# Patient Record
Sex: Female | Born: 1935 | Race: Black or African American | Hispanic: No | Smoking: Never smoker
Health system: Southern US, Community
[De-identification: ages and names within clinical notes are randomized; demographics above are authoritative.]

## PROBLEM LIST (undated history)

## (undated) DIAGNOSIS — I10 Essential (primary) hypertension: Secondary | ICD-10-CM

## (undated) DIAGNOSIS — E785 Hyperlipidemia, unspecified: Secondary | ICD-10-CM

## (undated) DIAGNOSIS — M109 Gout, unspecified: Secondary | ICD-10-CM

## (undated) DIAGNOSIS — M79672 Pain in left foot: Secondary | ICD-10-CM

## (undated) HISTORY — PX: EYE SURGERY: SHX253

## (undated) HISTORY — DX: Hyperlipidemia, unspecified: E78.5

## (undated) HISTORY — DX: Essential (primary) hypertension: I10

## (undated) HISTORY — PX: BACK SURGERY: SHX140

---
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
## 1978-04-05 HISTORY — PX: LAPAROSCOPIC CHOLECYSTECTOMY: SUR755

## 2003-03-04 ENCOUNTER — Encounter: Admission: RE | Admit: 2003-03-04 | Discharge: 2003-03-04 | Payer: Self-pay | Admitting: Nephrology

## 2003-04-06 HISTORY — PX: ESOPHAGOGASTRODUODENOSCOPY: SHX1529

## 2006-07-27 ENCOUNTER — Other Ambulatory Visit: Admission: RE | Admit: 2006-07-27 | Discharge: 2006-07-27 | Payer: Self-pay | Admitting: Family Medicine

## 2011-07-01 ENCOUNTER — Encounter: Payer: Self-pay | Admitting: Gastroenterology

## 2011-08-04 HISTORY — PX: COLONOSCOPY: SHX174

## 2011-08-06 ENCOUNTER — Telehealth: Payer: Self-pay | Admitting: *Deleted

## 2011-08-06 NOTE — Telephone Encounter (Signed)
Pt did not show for previsit appointment 08/06/11 at 1:30 pm. Spoke with patient who asked me to call her daughter on cell phone and reschedule with her. Phone to daughter, no answer, left message to call back by 5:00 pm today to reschedule previsit or colonoscopy scheduled 08/20/11 will be cancelled as well and both appointments will need to be rescheduled.

## 2011-08-11 ENCOUNTER — Ambulatory Visit (AMBULATORY_SURGERY_CENTER): Payer: Medicare Other | Admitting: *Deleted

## 2011-08-11 ENCOUNTER — Encounter: Payer: Self-pay | Admitting: Gastroenterology

## 2011-08-11 VITALS — Ht 63.0 in | Wt 167.5 lb

## 2011-08-11 DIAGNOSIS — Z1211 Encounter for screening for malignant neoplasm of colon: Secondary | ICD-10-CM

## 2011-08-11 MED ORDER — PEG-KCL-NACL-NASULF-NA ASC-C 100 G PO SOLR: ORAL | Status: DC

## 2011-08-20 ENCOUNTER — Ambulatory Visit (AMBULATORY_SURGERY_CENTER): Payer: Medicare Other | Admitting: Gastroenterology

## 2011-08-20 ENCOUNTER — Encounter: Payer: Self-pay | Admitting: Gastroenterology

## 2011-08-20 VITALS — BP 151/82 | HR 59 | Temp 96.1°F | Resp 20 | Ht 63.0 in | Wt 167.0 lb

## 2011-08-20 DIAGNOSIS — Z86010 Personal history of colonic polyps: Secondary | ICD-10-CM

## 2011-08-20 DIAGNOSIS — D126 Benign neoplasm of colon, unspecified: Secondary | ICD-10-CM

## 2011-08-20 DIAGNOSIS — Z1211 Encounter for screening for malignant neoplasm of colon: Secondary | ICD-10-CM

## 2011-08-20 MED ORDER — SODIUM CHLORIDE 0.9 % IV SOLN: 500.0000 mL | INTRAVENOUS | Status: DC

## 2011-08-20 NOTE — Progress Notes (Signed)
Patient did not experience any of the following events: a burn prior to discharge; a fall within the facility; wrong site/side/patient/procedure/implant event; or a hospital transfer or hospital admission upon discharge from the facility. (G8907) Patient did not have preoperative order for IV antibiotic SSI prophylaxis. (G8918)  

## 2011-08-20 NOTE — Op Note (Signed)
[facility] [address] [place], [place]  [address]  COLONOSCOPY PROCEDURE REPORT PATIENT:  Jackie Hickman, Jackie Hickman  MR#:  [id number] BIRTHDATE:  Nov 13, 1932, 79 yrs. old  GENDER:  female ENDOSCOPIST:  Judie Petit T. Russella Dar, MD, [facility]  PROCEDURE DATE:  08/20/2011 PROCEDURE:  Colonoscopy with snare polypectomy ASA CLASS:  Class II INDICATIONS:  1) surveillance and high-risk screening  2) history of pre-cancerous (adenomatous) colon polyps: 2005 MEDICATIONS:   These medications were titrated to patient response per physician's verbal order, Fentanyl 50 mcg IV, Versed 4 mg IV DESCRIPTION OF PROCEDURE:   After the risks benefits and alternatives of the procedure were thoroughly explained, informed consent was obtained.  Digital rectal exam was performed and revealed no abnormalities.   The LB [id number] endoscope was introduced through the anus and advanced to the cecum, which was identified by both the appendix and ileocecal valve, without limitations.  The quality of the prep was good, using MoviPrep. The instrument was then slowly withdrawn as the colon was fully examined. <<PROCEDUREIMAGES>> FINDINGS:  A sessile polyp was found in the cecum. It was 6 mm in size. Polyp was snared without cautery. Retrieval was successful. A sessile polyp was found in the ascending colon located 5 cm above the IC valve-see photo. It was 25 mm in size. Polyp was snared, then cauterized with monopolar cautery. Retrieval was successful. Piecemeal polypectomy with almost all neoplastic tissue removed. Otherwise normal colonoscopy without other polyps, masses, vascular ectasias, or inflammatory changes. Retroflexed views in the rectum revealed no abnormalities. The time to cecum = 2.33  minutes. The scope was then withdrawn (time =  20.5  min) from the patient and the procedure completed.  COMPLICATIONS:  None  ENDOSCOPIC IMPRESSION: 1) 6 mm sessile polyp in the cecum 2) 25 mm sessile polyp in  the ascending colon  RECOMMENDATIONS: 1) Hold aspirin, aspirin products, and anti-inflammatory medication for 3 weeks. 2) Await pathology results 3) Repeat Colonoscopy in 6 months if asc colon polyp benign.  Venita Lick. Russella Dar, MD, [name]  CC:  Rudi Heap, MD  n. Rosalie DoctorVenita Lick. Friend Dorfman at 08/20/2011 11:33 AM  Pershing Cox, [id number]

## 2011-08-20 NOTE — Patient Instructions (Addendum)

## 2011-08-23 ENCOUNTER — Telehealth: Payer: Self-pay | Admitting: *Deleted

## 2011-08-23 NOTE — Telephone Encounter (Signed)
  Follow up Call-  Call back number 08/20/2011  Post procedure Call Back phone  # [phone]  Permission to leave phone message Yes     Patient questions:  Do you have a fever, pain , or abdominal swelling? no Pain Score  0 *  Have you tolerated food without any problems? yes  Have you been able to return to your normal activities? yes  Do you have any questions about your discharge instructions: Diet   no Medications  no Follow up visit  no  Do you have questions or concerns about your Care? no  Actions: * If pain score is 4 or above: No action needed, pain <4.

## 2011-08-24 ENCOUNTER — Encounter: Payer: Self-pay | Admitting: Gastroenterology

## 2012-07-28 ENCOUNTER — Encounter: Payer: Self-pay | Admitting: Gastroenterology

## 2012-08-03 ENCOUNTER — Telehealth: Payer: Self-pay | Admitting: Gastroenterology

## 2012-08-03 HISTORY — PX: COLONOSCOPY: SHX174

## 2012-08-03 NOTE — Telephone Encounter (Signed)
Patient is due, she was just sent another recall She is scheduled for 08/30/12 and pre-visit for 5/15

## 2012-08-17 ENCOUNTER — Encounter: Payer: Self-pay | Admitting: Gastroenterology

## 2012-08-17 ENCOUNTER — Ambulatory Visit (AMBULATORY_SURGERY_CENTER): Payer: Medicare Other | Admitting: *Deleted

## 2012-08-17 VITALS — Ht 63.0 in | Wt 166.8 lb

## 2012-08-17 DIAGNOSIS — Z1211 Encounter for screening for malignant neoplasm of colon: Secondary | ICD-10-CM

## 2012-08-17 MED ORDER — NA SULFATE-K SULFATE-MG SULF 17.5-3.13-1.6 GM/177ML PO SOLN: ORAL | Status: DC

## 2012-08-25 ENCOUNTER — Other Ambulatory Visit: Payer: Self-pay | Admitting: Family Medicine

## 2012-08-30 ENCOUNTER — Other Ambulatory Visit: Payer: Self-pay | Admitting: Gastroenterology

## 2012-08-30 ENCOUNTER — Encounter: Payer: Self-pay | Admitting: Gastroenterology

## 2012-08-30 ENCOUNTER — Other Ambulatory Visit: Payer: Self-pay | Admitting: *Deleted

## 2012-08-30 ENCOUNTER — Ambulatory Visit (AMBULATORY_SURGERY_CENTER): Payer: Medicare Other | Admitting: Gastroenterology

## 2012-08-30 VITALS — BP 169/88 | HR 52 | Temp 97.0°F | Resp 16 | Ht 63.0 in | Wt 166.0 lb

## 2012-08-30 DIAGNOSIS — R109 Unspecified abdominal pain: Secondary | ICD-10-CM

## 2012-08-30 DIAGNOSIS — Z1211 Encounter for screening for malignant neoplasm of colon: Secondary | ICD-10-CM

## 2012-08-30 DIAGNOSIS — Z860100 Personal history of colon polyps, unspecified: Secondary | ICD-10-CM

## 2012-08-30 DIAGNOSIS — D126 Benign neoplasm of colon, unspecified: Secondary | ICD-10-CM

## 2012-08-30 MED ORDER — SODIUM CHLORIDE 0.9 % IV SOLN: 500.0000 mL | INTRAVENOUS | Status: DC

## 2012-08-30 MED ORDER — HYOSCYAMINE SULFATE 0.125 MG SL SUBL: 0.1250 mg | SUBLINGUAL_TABLET | SUBLINGUAL | Status: DC | PRN

## 2012-08-30 NOTE — Op Note (Signed)
[facility] [address] [place], [address]   COLONOSCOPY PROCEDURE REPORT  PATIENT: Jackie Hickman, Jackie Hickman  MR#: [id number] BIRTHDATE: 1932-09-15 , 80  yrs. old GENDER: Female ENDOSCOPIST: Meryl Dare, MD, [facility] PROCEDURE DATE:  08/30/2012 PROCEDURE:   Colonoscopy with snare polypectomy ASA CLASS:   Class II INDICATIONS:Patient's personal history of adenomatous colon polyps: TVA with HGD piecemeal polypectomy in 08/2011. MEDICATIONS: MAC sedation, administered by CRNA and propofol (Diprivan) 100mg  IV DESCRIPTION OF PROCEDURE:   After the risks benefits and alternatives of the procedure were thoroughly explained, informed consent was obtained.  A digital rectal exam revealed no abnormalities of the rectum.   The LB [id number]  endoscope was introduced through the anus and advanced to the cecum, which was identified by both the appendix and ileocecal valve. No adverse events experienced.   The quality of the prep was good, using MoviPrep  The instrument was then slowly withdrawn as the colon was fully examined.  COLON FINDINGS: A sessile polyp measuring 6 mm in size was found at the cecum.  A polypectomy was performed with a cold snare.  The resection was complete and the polyp tissue was completely retrieved.   A sessile polyp measuring 12 mm in size was found in the ascending colon.  A piecemeal polypectomy was performed using snare cautery.  The resection was appeared to be complete and the polyp tissue was completely retrieved.   A sessile polyp measuring 6 mm in size was found in the transverse colon.  A polypectomy was performed with a cold snare.  The resection was complete and the polyp tissue was completely retrieved.   The colon was otherwise normal.  There was no diverticulosis, inflammation, polyps or cancers unless previously stated.  Retroflexed views revealed no abnormalities. The time to cecum=1 minutes 30 seconds.  Withdrawal time=11  minutes 00 seconds.  The scope was withdrawn and the procedure completed. COMPLICATIONS: There were no complications.  ENDOSCOPIC IMPRESSION: 1.   Sessile polyp measuring 6 mm at the cecum; polypectomy performed with a cold snare 2.   Sessile polyp measuring 12 mmin the ascending colon; piecemeal polypectomy performed using snare cautery 3.   Sessile polyp measuring 6 mm in the transverse colon; polypectomy performed with a cold snare  RECOMMENDATIONS: 1.  Hold aspirin, aspirin products, and anti-inflammatory medication for 2 weeks. 2.  Await pathology results 3.  Repeat Colonoscopy in 3-6 months, pending path review, to assess completeness of ascending colon polypectomy.   eSigned:  Meryl Dare, MD, [facility] 08/30/2012 2:25 PM      PATIENT NAME:  Jackie Hickman, Jackie Hickman MR#: [id number]

## 2012-08-30 NOTE — Progress Notes (Signed)
Patient did not experience any of the following events: a burn prior to discharge; a fall within the facility; wrong site/side/patient/procedure/implant event; or a hospital transfer or hospital admission upon discharge from the facility. (G8907) Patient did not have preoperative order for IV antibiotic SSI prophylaxis. (G8918)  

## 2012-08-30 NOTE — Progress Notes (Signed)
Lidocaine-40mg IV prior to Propofol InductionPropofol given over incremental dosages 

## 2012-08-30 NOTE — Patient Instructions (Addendum)
HOLD ASPIRIN,ASPIRIN PRODUCTS AND ANTIINFLAMMATORIES FOR 2 WEEKS , June 11,2014     YOU HAD AN ENDOSCOPIC PROCEDURE TODAY AT THE [facility]: Refer to the procedure report that was given to you for any specific questions about what was found during the examination.  If the procedure report does not answer your questions, please call your gastroenterologist to clarify.  If you requested that your care partner not be given the details of your procedure findings, then the procedure report has been included in a sealed envelope for you to review at your convenience later.  YOU SHOULD EXPECT: Some feelings of bloating in the abdomen. Passage of more gas than usual.  Walking can help get rid of the air that was put into your GI tract during the procedure and reduce the bloating. If you had a lower endoscopy (such as a colonoscopy or flexible sigmoidoscopy) you may notice spotting of blood in your stool or on the toilet paper. If you underwent a bowel prep for your procedure, then you may not have a normal bowel movement for a few days.  DIET: Your first meal following the procedure should be a light meal and then it is ok to progress to your normal diet.  A half-sandwich or bowl of soup is an example of a good first meal.  Heavy or fried foods are harder to digest and may make you feel nauseous or bloated.  Likewise meals heavy in dairy and vegetables can cause extra gas to form and this can also increase the bloating.  Drink plenty of fluids but you should avoid alcoholic beverages for 24 hours.  ACTIVITY: Your care partner should take you home directly after the procedure.  You should plan to take it easy, moving slowly for the rest of the day.  You can resume normal activity the day after the procedure however you should NOT DRIVE or use heavy machinery for 24 hours (because of the sedation medicines used during the test).    SYMPTOMS TO REPORT IMMEDIATELY: A gastroenterologist can be reached  at any hour.  During normal business hours, 8:30 AM to 5:00 PM Monday through Friday, call [phone].  After hours and on weekends, please call the GI answering service at [phone] who will take a message and have the physician on call contact you.   Following lower endoscopy (colonoscopy or flexible sigmoidoscopy):  Excessive amounts of blood in the stool  Significant tenderness or worsening of abdominal pains  Swelling of the abdomen that is new, acute  Fever of 100F or higher  FOLLOW UP: If any biopsies were taken you will be contacted by phone or by letter within the next 1-3 weeks.  Call your gastroenterologist if you have not heard about the biopsies in 3 weeks.  Our staff will call the home number listed on your records the next business day following your procedure to check on you and address any questions or concerns that you may have at that time regarding the information given to you following your procedure. This is a courtesy call and so if there is no answer at the home number and we have not heard from you through the emergency physician on call, we will assume that you have returned to your regular daily activities without incident.  SIGNATURES/CONFIDENTIALITY: You and/or your care partner have signed paperwork which will be entered into your electronic medical record.  These signatures attest to the fact that that the information above on your After Visit  Summary has been reviewed and is understood.  Full responsibility of the confidentiality of this discharge information lies with you and/or your care-partner. 

## 2012-08-30 NOTE — Progress Notes (Signed)
Called to room to assist during endoscopic procedure.  Patient ID and intended procedure confirmed with present staff. Received instructions for my participation in the procedure from the performing physician.  

## 2012-08-31 ENCOUNTER — Telehealth: Payer: Self-pay | Admitting: *Deleted

## 2012-08-31 NOTE — Telephone Encounter (Signed)
  Follow up Call-  Call back number 08/30/2012 08/20/2011  Post procedure Call Back phone  # [phone]  Permission to leave phone message Yes Yes     Patient questions:  Do you have a fever, pain , or abdominal swelling? no Pain Score  0 *  Have you tolerated food without any problems? yes  Have you been able to return to your normal activities? yes  Do you have any questions about your discharge instructions: Diet   no Medications  no Follow up visit  no  Do you have questions or concerns about your Care? no  Actions: * If pain score is 4 or above: No action needed, pain <4.

## 2012-09-04 ENCOUNTER — Encounter: Payer: Self-pay | Admitting: Gastroenterology

## 2012-10-02 ENCOUNTER — Other Ambulatory Visit: Payer: Self-pay | Admitting: Family Medicine

## 2012-10-03 NOTE — Telephone Encounter (Signed)
Last seen 05/04/12  [facility] [place]

## 2012-10-24 ENCOUNTER — Other Ambulatory Visit: Payer: Self-pay | Admitting: Nurse Practitioner

## 2012-10-26 ENCOUNTER — Other Ambulatory Visit: Payer: Self-pay

## 2012-10-26 MED ORDER — ATORVASTATIN CALCIUM 40 MG PO TABS: 40.0000 mg | ORAL_TABLET | Freq: Every day | ORAL | Status: DC

## 2012-10-26 MED ORDER — ATORVASTATIN CALCIUM 20 MG PO TABS: 20.0000 mg | ORAL_TABLET | Freq: Every day | ORAL | Status: DC

## 2012-10-26 NOTE — Telephone Encounter (Signed)
TAMMY FROM [facility] CALLED TO SAY SHE GOT REFILL SENT IN BY DB ON ATORVASTATIN 20MG  BUT SHE HAS BEEN GETTING 40 MG PLEASE CLARIFY AND CALL [facility].

## 2012-10-26 NOTE — Telephone Encounter (Signed)
Last seen 05/04/12  [facility]  LAST LIPIDS 04/04/12

## 2012-10-30 ENCOUNTER — Telehealth: Payer: Self-pay | Admitting: Family Medicine

## 2012-11-02 NOTE — Telephone Encounter (Signed)
Per pt she doesn't want

## 2012-12-15 ENCOUNTER — Other Ambulatory Visit: Payer: Self-pay | Admitting: Family Medicine

## 2012-12-18 NOTE — Telephone Encounter (Signed)
Last lipids 04/04/12  [facility]

## 2012-12-18 NOTE — Telephone Encounter (Signed)
ntbs for lipitor refill 

## 2012-12-25 ENCOUNTER — Encounter: Payer: Self-pay | Admitting: Gastroenterology

## 2013-01-18 ENCOUNTER — Ambulatory Visit: Payer: Medicare Other | Admitting: Gastroenterology

## 2013-01-18 ENCOUNTER — Ambulatory Visit (AMBULATORY_SURGERY_CENTER): Payer: Self-pay

## 2013-01-18 VITALS — Ht 63.0 in | Wt 159.0 lb

## 2013-01-18 DIAGNOSIS — Z860100 Personal history of colon polyps, unspecified: Secondary | ICD-10-CM

## 2013-01-18 MED ORDER — SUPREP BOWEL PREP KIT 17.5-3.13-1.6 GM/177ML PO SOLN: 1.0000 | Freq: Once | ORAL | Status: DC

## 2013-01-19 ENCOUNTER — Encounter: Payer: Self-pay | Admitting: Gastroenterology

## 2013-01-22 ENCOUNTER — Encounter: Payer: Self-pay | Admitting: General Practice

## 2013-01-22 ENCOUNTER — Other Ambulatory Visit: Payer: Self-pay

## 2013-01-22 ENCOUNTER — Ambulatory Visit (INDEPENDENT_AMBULATORY_CARE_PROVIDER_SITE_OTHER): Payer: Medicare Other | Admitting: General Practice

## 2013-01-22 VITALS — BP 210/100 | HR 71 | Temp 98.1°F | Ht 63.0 in | Wt 159.0 lb

## 2013-01-22 DIAGNOSIS — I10 Essential (primary) hypertension: Secondary | ICD-10-CM

## 2013-01-22 DIAGNOSIS — Z09 Encounter for follow-up examination after completed treatment for conditions other than malignant neoplasm: Secondary | ICD-10-CM

## 2013-01-22 DIAGNOSIS — E785 Hyperlipidemia, unspecified: Secondary | ICD-10-CM

## 2013-01-22 DIAGNOSIS — E119 Type 2 diabetes mellitus without complications: Secondary | ICD-10-CM

## 2013-01-22 LAB — POCT CBC
Granulocyte percent: 54.5 %G (ref 37–80)
Lymph, poc: 2.6 (ref 0.6–3.4)
MCH, POC: 31.5 pg — AB (ref 27–31.2)
MCV: 96.7 fL (ref 80–97)
POC Granulocyte: 3.4 (ref 2–6.9)
Platelet Count, POC: 306 10*3/uL (ref 142–424)
RDW, POC: 13.7 %
WBC: 6.3 10*3/uL (ref 4.6–10.2)

## 2013-01-22 MED ORDER — NEBIVOLOL HCL 10 MG PO TABS: 10.0000 mg | ORAL_TABLET | Freq: Every day | ORAL | Status: DC

## 2013-01-22 MED ORDER — ATORVASTATIN CALCIUM 40 MG PO TABS: 40.0000 mg | ORAL_TABLET | Freq: Every day | ORAL | Status: DC

## 2013-01-22 MED ORDER — METFORMIN HCL 1000 MG PO TABS: 1000.0000 mg | ORAL_TABLET | Freq: Two times a day (BID) | ORAL | Status: DC

## 2013-01-22 MED ORDER — AMLODIPINE BESYLATE 2.5 MG PO TABS: 2.5000 mg | ORAL_TABLET | Freq: Every day | ORAL | Status: DC

## 2013-01-22 NOTE — Progress Notes (Signed)
  Subjective:    Patient ID: Jackie Hickman, female    DOB: Apr 01, 1933, 79 y.o.   MRN: [id number]  HPI Patient presents today for chronic health follow up. She has a history of hypertension, diabetes, and hyperlipidemia. She reports taking medications as directed. Reports checking blood sugars daily and range 120's-160's. She denies checking blood pressure. Reports eating low salt diet. Denies regular exercise.     Review of Systems  Constitutional: Negative for fever and chills.  Respiratory: Negative for chest tightness and shortness of breath.   Cardiovascular: Negative for chest pain, palpitations and leg swelling.  Gastrointestinal: Negative for nausea, vomiting, abdominal pain, diarrhea, constipation and blood in stool.  Genitourinary: Negative for dysuria, hematuria and difficulty urinating.  Musculoskeletal: Negative for back pain and neck pain.  Neurological: Negative for dizziness, weakness and headaches.       Objective:   Physical Exam  Constitutional: She is oriented to person, place, and time. She appears well-developed and well-nourished.  HENT:  Head: Normocephalic and atraumatic.  Right Ear: External ear normal.  Left Ear: External ear normal.  Nose: Nose normal.  Mouth/Throat: Oropharynx is clear and moist.  Eyes: EOM are normal. Pupils are equal, round, and reactive to light.  Neck: Normal range of motion. Neck supple. No thyromegaly present.  Cardiovascular: Normal rate, regular rhythm and normal heart sounds.   Pulmonary/Chest: Effort normal and breath sounds normal. No respiratory distress. She exhibits no tenderness.  Abdominal: Soft. Bowel sounds are normal. She exhibits no distension. There is no tenderness.  Musculoskeletal: She exhibits no edema and no tenderness.  Lymphadenopathy:    She has no cervical adenopathy.  Neurological: She is alert and oriented to person, place, and time.  Skin: Skin is warm and dry.  Psychiatric: She has a normal mood and  affect.          Assessment & Plan:  1. Hypertension  - amLODipine (NORVASC) 2.5 MG tablet; Take 1 tablet (2.5 mg total) by mouth daily.  Dispense: 30 tablet; Refill: 3 - nebivolol (BYSTOLIC) 10 MG tablet; Take 1 tablet (10 mg total) by mouth daily.  Dispense: 30 tablet; Refill: 3 - CMP14+EGFR  2. Hyperlipidemia  - atorvastatin (LIPITOR) 40 MG tablet; Take 1 tablet (40 mg total) by mouth daily.  Dispense: 30 tablet; Refill: 3 - NMR, lipoprofile  3. Diabetes  - metFORMIN (GLUCOPHAGE) 1000 MG tablet; Take 1 tablet (1,000 mg total) by mouth 2 (two) times daily with a meal.  Dispense: 60 tablet; Refill: 3 - POCT glycosylated hemoglobin (Hb A1C)  4. Follow-up exam, 3-6 months since previous exam  - POCT CBC -Continue all current medications Labs pending, cmp, lipid panel F/u in 3 months Discussed exercise and diet  Coralie Keens, FNP-C

## 2013-01-22 NOTE — Patient Instructions (Signed)

## 2013-01-24 LAB — CMP14+EGFR
ALT: 8 IU/L (ref 0–32)
AST: 14 IU/L (ref 0–40)
Albumin/Globulin Ratio: 1.6 (ref 1.1–2.5)
Alkaline Phosphatase: 49 IU/L (ref 39–117)
BUN/Creatinine Ratio: 19 (ref 11–26)
CO2: 23 mmol/L (ref 18–29)
Chloride: 102 mmol/L (ref 97–108)
GFR calc Af Amer: 86 mL/min/{1.73_m2} (ref >59)
Glucose: 148 mg/dL — ABNORMAL HIGH (ref 65–99)
Potassium: 4.5 mmol/L (ref 3.5–5.2)
Sodium: 145 mmol/L — ABNORMAL HIGH (ref 134–144)
Total Bilirubin: 0.3 mg/dL (ref 0.0–1.2)
Total Protein: 7 g/dL (ref 6.0–8.5)

## 2013-01-24 LAB — NMR, LIPOPROFILE
HDL Cholesterol by NMR: 52 mg/dL (ref >=40)
LDL Particle Number: 2527 nmol/L — ABNORMAL HIGH (ref <1000)
LDL Size: 20.5 nm — ABNORMAL LOW (ref >20.5)
LP-IR Score: 57 — ABNORMAL HIGH (ref <=45)
Small LDL Particle Number: 1394 nmol/L — ABNORMAL HIGH (ref <=527)

## 2013-01-29 ENCOUNTER — Ambulatory Visit: Payer: Medicare Other | Admitting: General Practice

## 2013-01-29 ENCOUNTER — Encounter: Payer: Self-pay | Admitting: General Practice

## 2013-01-29 ENCOUNTER — Ambulatory Visit (INDEPENDENT_AMBULATORY_CARE_PROVIDER_SITE_OTHER): Payer: Medicare Other | Admitting: General Practice

## 2013-01-29 VITALS — BP 192/96 | HR 59 | Temp 97.2°F | Ht 63.0 in | Wt 158.0 lb

## 2013-01-29 DIAGNOSIS — I10 Essential (primary) hypertension: Secondary | ICD-10-CM

## 2013-01-29 MED ORDER — HYDROCHLOROTHIAZIDE 25 MG PO TABS: 25.0000 mg | ORAL_TABLET | Freq: Every day | ORAL | Status: DC

## 2013-01-29 MED ORDER — LISINOPRIL 40 MG PO TABS: 40.0000 mg | ORAL_TABLET | Freq: Every day | ORAL | Status: DC

## 2013-01-29 MED ORDER — CLONIDINE HCL 0.1 MG PO TABS
0.1000 mg | ORAL_TABLET | Freq: Once | ORAL | Status: AC
  Administered 2013-01-29: 0.1 mg via ORAL

## 2013-01-29 NOTE — Patient Instructions (Signed)

## 2013-01-29 NOTE — Progress Notes (Signed)
  Subjective:    Patient ID: Jackie Hickman, female    DOB: Jan 07, 1933, 79 y.o.   MRN: [id number]  HPI Patient presents today for blood pressure recheck. She reports taking bystolic as directed, but didn't pick up second antihypertensive medication (norvasc) from pharmacy. She denies checking blood pressure at home.     Review of Systems  Constitutional: Negative for fever and chills.  Respiratory: Negative for cough, chest tightness, shortness of breath and wheezing.   Cardiovascular: Negative for chest pain and palpitations.  Gastrointestinal: Negative for abdominal pain, diarrhea, constipation and blood in stool.  Genitourinary: Negative for dysuria, hematuria and difficulty urinating.  Musculoskeletal: Negative for back pain, neck pain and neck stiffness.  Neurological: Negative for dizziness, weakness and headaches.       Objective:   Physical Exam  Constitutional: She is oriented to person, place, and time. She appears well-developed and well-nourished.  Eyes: EOM are normal.  Cardiovascular: Normal rate, regular rhythm and normal heart sounds.   Pulmonary/Chest: Effort normal and breath sounds normal. No respiratory distress. She exhibits no tenderness.  Abdominal: Soft. Bowel sounds are normal. She exhibits no distension. There is no tenderness.  Neurological: She is alert and oriented to person, place, and time.  Skin: Skin is warm and dry.  Psychiatric: She has a normal mood and affect.          Assessment & Plan:  1. Hypertension - cloNIDine (CATAPRES) tablet 0.1 mg; Take 1 tablet (0.1 mg total) by mouth once. - lisinopril (PRINIVIL,ZESTRIL) 40 MG tablet; Take 1 tablet (40 mg total) by mouth daily.  Dispense: 30 tablet; Refill: 0 - hydrochlorothiazide (HYDRODIURIL) 25 MG tablet; Take 1 tablet (25 mg total) by mouth daily.  Dispense: 30 tablet; Refill: 0 -Continue bystolic as prescribed -discontinue norvasc (which verified, hasn't been picked up from [facility], pharmacy to discontinue order) -discussed low sodium diet -RTO in 1 week for recheck of blood pressure -may seek emergency medical treatment if chest pain, shortness of breath, or other symptoms -Patient verbalized understanding -Coralie Keens, FNP-C

## 2013-02-03 HISTORY — PX: COLONOSCOPY: SHX174

## 2013-02-05 ENCOUNTER — Ambulatory Visit (INDEPENDENT_AMBULATORY_CARE_PROVIDER_SITE_OTHER): Payer: Medicare Other | Admitting: General Practice

## 2013-02-05 ENCOUNTER — Encounter: Payer: Self-pay | Admitting: General Practice

## 2013-02-05 VITALS — BP 157/79 | HR 66 | Temp 98.1°F | Ht 63.0 in | Wt 155.5 lb

## 2013-02-05 DIAGNOSIS — I10 Essential (primary) hypertension: Secondary | ICD-10-CM

## 2013-02-05 MED ORDER — HYDROCHLOROTHIAZIDE 25 MG PO TABS: 25.0000 mg | ORAL_TABLET | Freq: Every day | ORAL | Status: DC

## 2013-02-05 NOTE — Progress Notes (Signed)
  Subjective:    Patient ID: Jackie Hickman, female    DOB: 12/08/1932, 79 y.o.   MRN: [id number]  HPI Patient presents today for follow up of hypertension. She reports taking medication as directed. Denies blood pressure being checked outside of office. Reports eating a regular diet or exercise.     Review of Systems  Constitutional: Negative for fever and chills.  Respiratory: Negative for chest tightness and shortness of breath.   Cardiovascular: Negative for chest pain and palpitations.  Gastrointestinal: Negative for abdominal pain.  Genitourinary: Negative for dysuria and difficulty urinating.  Neurological: Negative for dizziness, weakness and headaches.       Objective:   Physical Exam  Constitutional: She is oriented to person, place, and time. She appears well-developed and well-nourished.  Eyes: EOM are normal.  Cardiovascular: Normal rate, regular rhythm and normal heart sounds.   Pulmonary/Chest: Effort normal and breath sounds normal. No respiratory distress. She exhibits no tenderness.  Abdominal: Soft. Bowel sounds are normal. She exhibits no distension. There is no tenderness.  Neurological: She is alert and oriented to person, place, and time.  Skin: Skin is warm and dry.  Psychiatric: She has a normal mood and affect.          Assessment & Plan:  1. Hypertension - hydrochlorothiazide (HYDRODIURIL) 25 MG tablet; Take 1 tablet (25 mg total) by mouth daily.  Dispense: 30 tablet; Refill: 3 -continue bystolic 10mg  daily -work on healthy eating (low fat, low sodium, baked foods) -Patient reports [employer] should be sending her a blood glucose and blood pressure monitor soon -Patient to keep diary once blood pressure machine in the home -RTO in 3 months  -Patient verbalized understanding -Coralie Keens, FNP-C

## 2013-02-05 NOTE — Patient Instructions (Signed)

## 2013-02-09 ENCOUNTER — Ambulatory Visit (AMBULATORY_SURGERY_CENTER): Payer: Medicare Other | Admitting: Gastroenterology

## 2013-02-09 ENCOUNTER — Encounter: Payer: Medicare Other | Admitting: Gastroenterology

## 2013-02-09 ENCOUNTER — Encounter: Payer: Self-pay | Admitting: Gastroenterology

## 2013-02-09 VITALS — BP 183/76 | HR 54 | Temp 97.5°F | Resp 33 | Ht 63.0 in | Wt 159.0 lb

## 2013-02-09 DIAGNOSIS — D126 Benign neoplasm of colon, unspecified: Secondary | ICD-10-CM

## 2013-02-09 DIAGNOSIS — Z86010 Personal history of colonic polyps: Secondary | ICD-10-CM

## 2013-02-09 MED ORDER — SODIUM CHLORIDE 0.9 % IV SOLN: 500.0000 mL | INTRAVENOUS | Status: DC

## 2013-02-09 NOTE — Progress Notes (Signed)
Report to pacu rn, vss, bbs=clear 

## 2013-02-09 NOTE — Progress Notes (Signed)
Patient did not have preoperative order for IV antibiotic SSI prophylaxis. (G8918)  Patient did not experience any of the following events: a burn prior to discharge; a fall within the facility; wrong site/side/patient/procedure/implant event; or a hospital transfer or hospital admission upon discharge from the facility. (G8907)  

## 2013-02-09 NOTE — Patient Instructions (Signed)
YOU HAD AN ENDOSCOPIC PROCEDURE TODAY AT THE [facility]: Refer to the procedure report that was given to you for any specific questions about what was found during the examination.  If the procedure report does not answer your questions, please call your gastroenterologist to clarify.  If you requested that your care partner not be given the details of your procedure findings, then the procedure report has been included in a sealed envelope for you to review at your convenience later.  YOU SHOULD EXPECT: Some feelings of bloating in the abdomen. Passage of more gas than usual.  Walking can help get rid of the air that was put into your GI tract during the procedure and reduce the bloating. If you had a lower endoscopy (such as a colonoscopy or flexible sigmoidoscopy) you may notice spotting of blood in your stool or on the toilet paper. If you underwent a bowel prep for your procedure, then you may not have a normal bowel movement for a few days.  DIET: Your first meal following the procedure should be a light meal and then it is ok to progress to your normal diet.  A half-sandwich or bowl of soup is an example of a good first meal.  Heavy or fried foods are harder to digest and may make you feel nauseous or bloated.  Likewise meals heavy in dairy and vegetables can cause extra gas to form and this can also increase the bloating.  Drink plenty of fluids but you should avoid alcoholic beverages for 24 hours.  ACTIVITY: Your care partner should take you home directly after the procedure.  You should plan to take it easy, moving slowly for the rest of the day.  You can resume normal activity the day after the procedure however you should NOT DRIVE or use heavy machinery for 24 hours (because of the sedation medicines used during the test).    SYMPTOMS TO REPORT IMMEDIATELY: A gastroenterologist can be reached at any hour.  During normal business hours, 8:30 AM to 5:00 PM Monday through Friday,  call [phone].  After hours and on weekends, please call the GI answering service at [phone] who will take a message and have the physician on call contact you.   Following lower endoscopy (colonoscopy or flexible sigmoidoscopy):  Excessive amounts of blood in the stool  Significant tenderness or worsening of abdominal pains  Swelling of the abdomen that is new, acute  Fever of 100F or higher  FOLLOW UP: If any biopsies were taken you will be contacted by phone or by letter within the next 1-3 weeks.  Call your gastroenterologist if you have not heard about the biopsies in 3 weeks.  Our staff will call the home number listed on your records the next business day following your procedure to check on you and address any questions or concerns that you may have at that time regarding the information given to you following your procedure. This is a courtesy call and so if there is no answer at the home number and we have not heard from you through the emergency physician on call, we will assume that you have returned to your regular daily activities without incident.  SIGNATURES/CONFIDENTIALITY: You and/or your care partner have signed paperwork which will be entered into your electronic medical record.  These signatures attest to the fact that that the information above on your After Visit Summary has been reviewed and is understood.  Full responsibility of the confidentiality of this   discharge information lies with you and/or your care-partner.  Recommendations See procedure report  

## 2013-02-09 NOTE — Op Note (Signed)
[facility] [address] [place], [address]   COLONOSCOPY PROCEDURE REPORT  PATIENT: Jackie Hickman, Jackie Hickman  MR#: [id number] BIRTHDATE: 08-Jan-1933 , 80  yrs. old GENDER: Female ENDOSCOPIST: Meryl Dare, MD, [facility] PROCEDURE DATE:  02/09/2013 PROCEDURE:   Colonoscopy, diagnostic First Screening Colonoscopy - Avg.  risk and is 50 yrs.  old or older - No.  Prior Negative Screening - Now for repeat screening. N/A  History of Adenoma - Now for follow-up colonoscopy & has been > or = to 3 yrs.  No.  It has been less than 3 yrs since last colonoscopy.  Other: See Comments  Polyps Removed Today? No. Recommend repeat exam, <10 yrs? Yes.  High risk (family or personal hx). ASA CLASS:   Class II INDICATIONS:Patient's personal history of adenomatous colon polyps-piecemeal polypectomy of TVA 6 months ago. MEDICATIONS: MAC sedation, administered by CRNA and propofol (Diprivan) 130mg  IV DESCRIPTION OF PROCEDURE:   After the risks benefits and alternatives of the procedure were thoroughly explained, informed consent was obtained.  A digital rectal exam revealed no abnormalities of the rectum.   The LB [id number]  endoscope was introduced through the anus and advanced to the cecum, which was identified by both the appendix and ileocecal valve. No adverse events experienced.   The quality of the prep was Suprep adequate The instrument was then slowly withdrawn as the colon was fully examined.  COLON FINDINGS: A post-polypectomy scar was noted in the ascending colon.   The colon was otherwise normal.  There was no diverticulosis, inflammation, polyps or cancers unless previously stated.  Retroflexed views revealed no abnormalities. The time to cecum=2 minutes 18 seconds.  Withdrawal time=8 minutes 54 seconds. The scope was withdrawn and the procedure completed.  COMPLICATIONS: There were no complications.  ENDOSCOPIC IMPRESSION: 1.   Post-polypectomy scar in the  ascending colon 2.   The colon was otherwise normal  RECOMMENDATIONS: 1.  High fiber diet with liberal fluid intake. 2.  Repeat Colonoscopy in 2 years with a more extensive bowel prep  eSigned:  Meryl Dare, MD, [facility] 02/09/2013 3:46 PM

## 2013-02-12 ENCOUNTER — Telehealth: Payer: Self-pay | Admitting: *Deleted

## 2013-02-12 NOTE — Telephone Encounter (Signed)
  Follow up Call-  Call back number 02/09/2013 08/30/2012 08/20/2011  Post procedure Call Back phone  # [phone]  Permission to leave phone message Yes Yes Yes     Patient questions:  Do you have a fever, pain , or abdominal swelling? no Pain Score  0 *  Have you tolerated food without any problems? yes  Have you been able to return to your normal activities? yes  Do you have any questions about your discharge instructions: Diet   no Medications  no Follow up visit  no  Do you have questions or concerns about your Care? no  Actions: * If pain score is 4 or above: No action needed, pain <4.

## 2013-04-24 ENCOUNTER — Encounter: Payer: Self-pay | Admitting: General Practice

## 2013-04-24 ENCOUNTER — Ambulatory Visit (INDEPENDENT_AMBULATORY_CARE_PROVIDER_SITE_OTHER): Payer: Medicare Other | Admitting: General Practice

## 2013-04-24 VITALS — BP 162/93 | HR 68 | Temp 97.7°F | Ht 63.0 in | Wt 154.0 lb

## 2013-04-24 DIAGNOSIS — E785 Hyperlipidemia, unspecified: Secondary | ICD-10-CM

## 2013-04-24 DIAGNOSIS — I10 Essential (primary) hypertension: Secondary | ICD-10-CM

## 2013-04-24 DIAGNOSIS — E119 Type 2 diabetes mellitus without complications: Secondary | ICD-10-CM

## 2013-04-24 DIAGNOSIS — E1129 Type 2 diabetes mellitus with other diabetic kidney complication: Secondary | ICD-10-CM | POA: Insufficient documentation

## 2013-04-24 LAB — POCT URINALYSIS DIPSTICK
Bilirubin, UA: NEGATIVE
Glucose, UA: NEGATIVE
Ketones, UA: NEGATIVE
Leukocytes, UA: NEGATIVE
NITRITE UA: NEGATIVE
PH UA: 6
RBC UA: NEGATIVE
Spec Grav, UA: 1.01
Urobilinogen, UA: NEGATIVE

## 2013-04-24 LAB — POCT UA - MICROSCOPIC ONLY
Bacteria, U Microscopic: NEGATIVE
CRYSTALS, UR, HPF, POC: NEGATIVE
Casts, Ur, LPF, POC: NEGATIVE
MUCUS UA: NEGATIVE
RBC, urine, microscopic: NEGATIVE
Yeast, UA: NEGATIVE

## 2013-04-24 LAB — POCT GLYCOSYLATED HEMOGLOBIN (HGB A1C): Hemoglobin A1C: 6.7

## 2013-04-24 MED ORDER — ATORVASTATIN CALCIUM 40 MG PO TABS: 40.0000 mg | ORAL_TABLET | Freq: Every day | ORAL | Status: DC

## 2013-04-24 MED ORDER — HYDROCHLOROTHIAZIDE 25 MG PO TABS: 25.0000 mg | ORAL_TABLET | Freq: Every day | ORAL | Status: DC

## 2013-04-24 MED ORDER — NEBIVOLOL HCL 20 MG PO TABS: 20.0000 mg | ORAL_TABLET | Freq: Every day | ORAL | Status: DC

## 2013-04-24 MED ORDER — METFORMIN HCL 1000 MG PO TABS: 1000.0000 mg | ORAL_TABLET | Freq: Two times a day (BID) | ORAL | Status: DC

## 2013-04-24 NOTE — Patient Instructions (Signed)

## 2013-04-24 NOTE — Progress Notes (Signed)
   Subjective:    Patient ID: Jackie Hickman, female    DOB: 08/09/1932, 80 y.o.   MRN: [id number]  HPI Patient presents today for chronic health follow up. History of hypertension, diabetes, and hyperlipidemia. She reports taking medications as directed. Reports checking blood sugars daily, 130's-150's. She denies checking blood pressure. Reports eating low salt diet. Denies regular exercise. Denies any complaints or concerns. Reports being seen by two eye specialist in [place].      Review of Systems  Constitutional: Negative for fever and chills.  Respiratory: Negative for chest tightness and shortness of breath.   Cardiovascular: Negative for chest pain, palpitations and leg swelling.  Gastrointestinal: Negative for nausea, vomiting, abdominal pain, diarrhea, constipation and blood in stool.  Genitourinary: Negative for dysuria, hematuria and difficulty urinating.  Musculoskeletal: Negative for back pain and neck pain.  Neurological: Negative for dizziness, weakness and headaches.  All other systems reviewed and are negative.       Objective:   Physical Exam  Constitutional: She is oriented to person, place, and time. She appears well-developed and well-nourished.  HENT:  Head: Normocephalic and atraumatic.  Right Ear: External ear normal.  Left Ear: External ear normal.  Nose: Nose normal.  Mouth/Throat: Oropharynx is clear and moist.  Eyes: EOM are normal.  Left eye cataract  Neck: Normal range of motion. Neck supple. No thyromegaly present.  Cardiovascular: Normal rate, regular rhythm and normal heart sounds.   Pulmonary/Chest: Effort normal and breath sounds normal. No respiratory distress. She exhibits no tenderness.  Abdominal: Soft. Bowel sounds are normal. She exhibits no distension. There is no tenderness.  Musculoskeletal: She exhibits no edema and no tenderness.  Lymphadenopathy:    She has no cervical adenopathy.  Neurological: She is alert and oriented to  person, place, and time.  Skin: Skin is warm and dry.  Psychiatric: She has a normal mood and affect.          Assessment & Plan:  1. Hypertension  - nebivolol 20 MG TABS; Take 1 tablet (20 mg total) by mouth daily.  Dispense: 30 tablet; Refill: 3 - hydrochlorothiazide (HYDRODIURIL) 25 MG tablet; Take 1 tablet (25 mg total) by mouth daily.  Dispense: 30 tablet; Refill: 3 - CMP14+EGFR  2. Diabetes  - metFORMIN (GLUCOPHAGE) 1000 MG tablet; Take 1 tablet (1,000 mg total) by mouth 2 (two) times daily with a meal.  Dispense: 60 tablet; Refill: 3 - POCT glycosylated hemoglobin (Hb A1C) - POCT UA - Microscopic Only - POCT urinalysis dipstick  3. Hyperlipidemia  - atorvastatin (LIPITOR) 40 MG tablet; Take 1 tablet (40 mg total) by mouth daily.  Dispense: 30 tablet; Refill: 3 - Lipid panel -Bystolic increased from 10 to $Rem'20mg'gotP$   Labs pending F/u on Friday for blood pressure recheck Discussed benefits of regular exercise and healthy  Patient verbalized understanding Erby Pian, FNP-C

## 2013-04-25 LAB — CMP14+EGFR
A/G RATIO: 1.6 (ref 1.1–2.5)
ALK PHOS: 48 IU/L (ref 39–117)
ALT: 7 IU/L (ref 0–32)
AST: 16 IU/L (ref 0–40)
Albumin: 4.2 g/dL (ref 3.5–4.8)
BUN / CREAT RATIO: 19 (ref 11–26)
BUN: 18 mg/dL (ref 8–27)
CO2: 28 mmol/L (ref 18–29)
Calcium: 9.5 mg/dL (ref 8.7–10.3)
Chloride: 101 mmol/L (ref 97–108)
Creatinine, Ser: 0.97 mg/dL (ref 0.57–1.00)
GFR calc Af Amer: 65 mL/min/{1.73_m2} (ref >59)
GFR, EST NON AFRICAN AMERICAN: 56 mL/min/{1.73_m2} — AB (ref >59)
GLOBULIN, TOTAL: 2.7 g/dL (ref 1.5–4.5)
GLUCOSE: 160 mg/dL — AB (ref 65–99)
Potassium: 4.2 mmol/L (ref 3.5–5.2)
Sodium: 147 mmol/L — ABNORMAL HIGH (ref 134–144)
TOTAL PROTEIN: 6.9 g/dL (ref 6.0–8.5)
Total Bilirubin: 0.5 mg/dL (ref 0.0–1.2)

## 2013-04-25 LAB — LIPID PANEL
CHOL/HDL RATIO: 2.5 ratio (ref 0.0–4.4)
Cholesterol, Total: 134 mg/dL (ref 100–199)
HDL: 53 mg/dL (ref >39)
LDL Calculated: 55 mg/dL (ref 0–99)
Triglycerides: 128 mg/dL (ref 0–149)
VLDL Cholesterol Cal: 26 mg/dL (ref 5–40)

## 2013-04-27 ENCOUNTER — Other Ambulatory Visit: Payer: Self-pay | Admitting: General Practice

## 2013-04-27 ENCOUNTER — Ambulatory Visit (INDEPENDENT_AMBULATORY_CARE_PROVIDER_SITE_OTHER): Payer: Medicare Other | Admitting: General Practice

## 2013-04-27 VITALS — BP 168/90

## 2013-04-27 DIAGNOSIS — I10 Essential (primary) hypertension: Secondary | ICD-10-CM

## 2013-04-27 MED ORDER — NEBIVOLOL HCL 20 MG PO TABS: 20.0000 mg | ORAL_TABLET | Freq: Every day | ORAL | Status: DC

## 2013-05-01 NOTE — Progress Notes (Signed)
Patient ID: Jackie Hickman, female   DOB: 1933/03/02, 80 y.o.   MRN: [id number]  Patient presents today for blood pressure follow up. She denies taking medication this am.  Discussed importance of taking medications as prescribed.  Discussed healthy eating  1. Hypertension - Nebivolol HCl 20 MG TABS; Take 1 tablet (20 mg total) by mouth daily.  Dispense: 30 tablet; Refill: 3 -RTO in 2 weeks for recheck of blood pressure Patient verbalized understanding Erby Pian, FNP-C

## 2013-05-11 ENCOUNTER — Ambulatory Visit (INDEPENDENT_AMBULATORY_CARE_PROVIDER_SITE_OTHER): Payer: Medicare Other | Admitting: General Practice

## 2013-05-11 VITALS — BP 160/88 | HR 60 | Temp 97.0°F | Ht 63.0 in | Wt 154.0 lb

## 2013-05-11 DIAGNOSIS — I10 Essential (primary) hypertension: Secondary | ICD-10-CM

## 2013-05-11 NOTE — Patient Instructions (Signed)

## 2013-05-11 NOTE — Progress Notes (Signed)
   Subjective:    Patient ID: Jackie Hickman, female    DOB: September 08, 1932, 80 y.o.   MRN: [id number]  HPI Patient presents today for follow up of blood pressure. She reports taking nebivolol 10mg  instead of 20mg . During last visit this medication was increased from 10 to 20mg . Taking HCTZ as directed.   Reports increased stress due to son being in rehabilitation from injury.    Review of Systems  Constitutional: Negative for fever and chills.  Respiratory: Negative for chest tightness and shortness of breath.   Cardiovascular: Negative for chest pain, palpitations and leg swelling.       Objective:   Physical Exam  Constitutional: She is oriented to person, place, and time. She appears well-developed and well-nourished.  Cardiovascular: Normal rate, regular rhythm and normal heart sounds.   Pulmonary/Chest: Effort normal and breath sounds normal. No respiratory distress. She exhibits no tenderness.  Neurological: She is alert and oriented to person, place, and time.  Skin: Skin is warm and dry.  Psychiatric: She has a normal mood and affect.          Assessment & Plan:  1. HTN (hypertension) -discussed medication and taking as prescribed (nebivolol and hctz) -RTO in 1 month for follow up -Patient verbalized understanding Erby Pian, FNP-C

## 2013-05-13 ENCOUNTER — Encounter: Payer: Self-pay | Admitting: General Practice

## 2013-07-12 ENCOUNTER — Other Ambulatory Visit: Payer: Self-pay | Admitting: General Practice

## 2013-07-13 NOTE — Telephone Encounter (Signed)
Do not see on current med list. Not listed per last office visit. Please advise

## 2013-07-17 ENCOUNTER — Other Ambulatory Visit: Payer: Self-pay | Admitting: General Practice

## 2013-07-17 DIAGNOSIS — I10 Essential (primary) hypertension: Secondary | ICD-10-CM

## 2013-07-17 MED ORDER — NEBIVOLOL HCL 20 MG PO TABS: 20.0000 mg | ORAL_TABLET | Freq: Every day | ORAL | Status: DC

## 2013-09-04 ENCOUNTER — Telehealth: Payer: Self-pay | Admitting: General Practice

## 2013-09-12 NOTE — Telephone Encounter (Signed)
Pt uses [employer] for supplies, faxed this company

## 2013-10-01 ENCOUNTER — Telehealth: Payer: Self-pay | Admitting: General Practice

## 2013-11-04 ENCOUNTER — Encounter (HOSPITAL_COMMUNITY): Payer: Self-pay | Admitting: Emergency Medicine

## 2013-11-04 ENCOUNTER — Emergency Department (HOSPITAL_COMMUNITY): Payer: Medicare Other

## 2013-11-04 ENCOUNTER — Emergency Department (HOSPITAL_COMMUNITY)
Admission: EM | Admit: 2013-11-04 | Discharge: 2013-11-04 | Disposition: A | Discharge status: Home / Self Care | Payer: Medicare Other | Attending: Emergency Medicine | Admitting: Emergency Medicine

## 2013-11-04 DIAGNOSIS — Z79899 Other long term (current) drug therapy: Secondary | ICD-10-CM | POA: Diagnosis not present

## 2013-11-04 DIAGNOSIS — Y929 Unspecified place or not applicable: Secondary | ICD-10-CM | POA: Diagnosis not present

## 2013-11-04 DIAGNOSIS — W208XXA Other cause of strike by thrown, projected or falling object, initial encounter: Secondary | ICD-10-CM | POA: Diagnosis not present

## 2013-11-04 DIAGNOSIS — Z7982 Long term (current) use of aspirin: Secondary | ICD-10-CM | POA: Diagnosis not present

## 2013-11-04 DIAGNOSIS — Y9389 Activity, other specified: Secondary | ICD-10-CM | POA: Diagnosis not present

## 2013-11-04 DIAGNOSIS — S8990XA Unspecified injury of unspecified lower leg, initial encounter: Secondary | ICD-10-CM | POA: Insufficient documentation

## 2013-11-04 DIAGNOSIS — S99929A Unspecified injury of unspecified foot, initial encounter: Principal | ICD-10-CM

## 2013-11-04 DIAGNOSIS — E119 Type 2 diabetes mellitus without complications: Secondary | ICD-10-CM | POA: Diagnosis not present

## 2013-11-04 DIAGNOSIS — M79671 Pain in right foot: Secondary | ICD-10-CM

## 2013-11-04 DIAGNOSIS — Z8669 Personal history of other diseases of the nervous system and sense organs: Secondary | ICD-10-CM | POA: Diagnosis not present

## 2013-11-04 DIAGNOSIS — M79672 Pain in left foot: Secondary | ICD-10-CM

## 2013-11-04 DIAGNOSIS — I10 Essential (primary) hypertension: Secondary | ICD-10-CM | POA: Insufficient documentation

## 2013-11-04 DIAGNOSIS — S99919A Unspecified injury of unspecified ankle, initial encounter: Principal | ICD-10-CM

## 2013-11-04 NOTE — ED Notes (Signed)
MD at bedside. 

## 2013-11-04 NOTE — ED Provider Notes (Signed)
CSN: [id number]     Arrival date & time 11/04/13  4098 History   First MD Initiated Contact with Patient 11/04/13 [phone]     Chief Complaint  Patient presents with  . Foot Pain      HPI Pt was seen at 0950. Per pt and her family, c/o gradual onset and persistence of constant left foot "pain" that began 2 weeks ago. Pt states the pain began after a frozen chicken breast fell out of the freezer and landed on her left foot. Pt was evaluated at [facility] after the incident with xray negative for fracture. Pt states her left foot "still hurts where the chicken landed on it" and "now my right foot hurts." Pt c/o dorsal right foot "pain" for the past week. States she "has been favoring my right foot because my left was hurting." Pt has not taken any medications to treat her pain. Denies new injury, no focal motor weakness, no tingling/numbness in extremities, no fevers, no rash.    Past Medical History  Diagnosis Date  . Glaucoma   . Diabetes mellitus   . Hyperlipidemia   . Hypertension    Past Surgical History  Procedure Laterality Date  . Appendectomy  1948  . Laparoscopic cholecystectomy  1980  . Eye surgery      bleeding and glaucoma   Family History  Problem Relation Age of Onset  . Colon cancer Neg Hx   . Stomach cancer Neg Hx    History  Substance Use Topics  . Smoking status: Never Smoker   . Smokeless tobacco: Never Used  . Alcohol Use: Yes     Comment: occasional drink    Review of Systems ROS: Statement: All systems negative except as marked or noted in the HPI; Constitutional: Negative for fever and chills. ; ; Eyes: Negative for eye pain, redness and discharge. ; ; ENMT: Negative for ear pain, hoarseness, nasal congestion, sinus pressure and sore throat. ; ; Cardiovascular: Negative for chest pain, palpitations, diaphoresis, dyspnea and peripheral edema. ; ; Respiratory: Negative for cough, wheezing and stridor. ; ; Gastrointestinal: Negative for nausea, vomiting, diarrhea,  abdominal pain, blood in stool, hematemesis, jaundice and rectal bleeding. . ; ; Genitourinary: Negative for dysuria, flank pain and hematuria. ; ; Musculoskeletal: +feet pain. Negative for back pain and neck pain. Negative for deformity..; ; Skin: Negative for pruritus, rash, abrasions, blisters, bruising and skin lesion.; ; Neuro: Negative for headache, lightheadedness and neck stiffness. Negative for weakness, altered level of consciousness , altered mental status, extremity weakness, paresthesias, involuntary movement, seizure and syncope.        Allergies  Review of patient's allergies indicates no known allergies.  Home Medications   Prior to Admission medications   Medication Sig Start Date End Date Taking? Authorizing Provider  acetaZOLAMIDE (DIAMOX) 500 MG capsule Take 500 mg by mouth 2 (two) times daily.   Yes Historical Provider, MD  aspirin 81 MG tablet Take 81 mg by mouth daily.   Yes Historical Provider, MD  atorvastatin (LIPITOR) 40 MG tablet Take 1 tablet (40 mg total) by mouth daily. 04/24/13  Yes Mae Loree Fee, FNP  brimonidine-timolol (COMBIGAN) 0.2-0.5 % ophthalmic solution Place 1 drop into both eyes every 12 (twelve) hours.   Yes Historical Provider, MD  hydrochlorothiazide (HYDRODIURIL) 25 MG tablet Take 1 tablet (25 mg total) by mouth daily. 04/24/13  Yes Mae Loree Fee, FNP  latanoprost (XALATAN) 0.005 % ophthalmic solution Place 1 drop into both eyes at bedtime.  Yes Historical Provider, MD  metFORMIN (GLUCOPHAGE) 1000 MG tablet Take 1 tablet (1,000 mg total) by mouth 2 (two) times daily with a meal. 04/24/13  Yes Mae Loree Fee, FNP  nebivolol (BYSTOLIC) 10 MG tablet Take 20 mg by mouth daily.   Yes Historical Provider, MD  pilocarpine (PILOCAR) 4 % ophthalmic solution Place 1 drop into both eyes 4 (four) times daily.   Yes Historical Provider, MD  prednisoLONE acetate (PRED FORTE) 1 % ophthalmic suspension Place 1 drop into the left eye 2 (two) times daily.     Yes Historical Provider, MD   BP 179/82  Pulse 64  Temp(Src) 98.2 F (36.8 C) (Oral)  Resp 18  Ht 5\' 3"  (1.6 m)  Wt 153 lb (69.4 kg)  BMI 27.11 kg/m2  SpO2 98% Physical Exam 0955: Physical examination:  Nursing notes reviewed; Vital signs and O2 SAT reviewed;  Constitutional: Well developed, Well nourished, Well hydrated, In no acute distress; Head:  Normocephalic, atraumatic; Eyes: EOMI, PERRL, No scleral icterus; ENMT: Mouth and pharynx normal, Mucous membranes moist; Neck: Supple, Full range of motion, No lymphadenopathy; Cardiovascular: Regular rate and rhythm, No gallop; Respiratory: Breath sounds clear & equal bilaterally, No wheezes.  Speaking full sentences with ease, Normal respiratory effort/excursion; Chest: Nontender, Movement normal; Abdomen: Soft, Nontender, Nondistended, Normal bowel sounds; Genitourinary: No CVA tenderness; Extremities: Pulses normal, NT bilat knees/ankles. +mild tenderness to bilat dorsal feet without specific area of point tenderness. No deformity, no erythema, no ecchymosis, no open wounds. +mild localized edema to dorsal left foot where pt states the frozen object hit her foot. No calf edema or asymmetry.; Neuro: AA&Ox3, Major CN grossly intact.  Speech clear. No gross focal motor or sensory deficits in extremities.; Skin: Color normal, Warm, Dry.   ED Course  Procedures     MDM  MDM Reviewed: previous chart, nursing note and vitals Interpretation: x-ray   Dg Foot Complete Left 11/04/2013   CLINICAL DATA:  Left foot pain and trauma over the metatarsals  EXAM: LEFT FOOT - COMPLETE 3+ VIEW  COMPARISON:  10/07/2013  FINDINGS: There is no evidence of fracture or dislocation. There is no evidence of arthropathy or other focal bone abnormality. Soft tissues are unremarkable. Mild plantar calcaneal spurring noted. The oblique view is suboptimally positioned. The Lisfranc joint is not well evaluated.  IMPRESSION: Negative.   Electronically Signed   By: Conchita Paris M.D.   On: 11/04/2013 11:36   Dg Foot Complete Right 11/04/2013   CLINICAL DATA:  Right foot pain across the metatarsals for the past 2 days. No known injury.  EXAM: RIGHT FOOT COMPLETE - 3+ VIEW  COMPARISON:  None.  FINDINGS: Large inferior posterior calcaneal spurs. Mild dorsal soft tissue swelling at the level of the tarsals. No underlying bony abnormality.  IMPRESSION: 1. Dorsal soft tissue swelling at the level of the tarsals without underlying bony abnormality. 2. Calcaneal spurs.   Electronically Signed   By: Enrique Sack M.D.   On: 11/04/2013 11:37    1145:  No acute findings on XR. Pt given APAP with improvement. Will have pt continue to tx symptomatically and f/u with PMD. Pt states she is ready to go home now.  Dx and testing d/w pt and family.  Questions answered.  Verb understanding, agreeable to d/c home with outpt f/u.   Francine Graven, DO 11/06/13 [phone]

## 2013-11-04 NOTE — Discharge Instructions (Signed)
°Emergency Department Resource Guide °1) Find a Doctor and Pay Out of Pocket °Although you won't have to find out who is covered by your insurance plan, it is a good idea to ask around and get recommendations. You will then need to call the office and see if the doctor you have chosen will accept you as a new patient and what types of options they offer for patients who are self-pay. Some doctors offer discounts or will set up payment plans for their patients who do not have insurance, but you will need to ask so you aren't surprised when you get to your appointment. ° °2) Contact Your Local Health Department °Not all health departments have doctors that can see patients for sick visits, but many do, so it is worth a call to see if yours does. If you don't know where your local health department is, you can check in your phone book. The CDC also has a tool to help you locate your state's health department, and many state websites also have listings of all of their local health departments. ° °3) Find a Walk-in Clinic °If your illness is not likely to be very severe or complicated, you may want to try a walk in clinic. These are popping up all over the country in pharmacies, drugstores, and shopping centers. They're usually staffed by nurse practitioners or physician assistants that have been trained to treat common illnesses and complaints. They're usually fairly quick and inexpensive. However, if you have serious medical issues or chronic medical problems, these are probably not your best option. ° °No Primary Care Doctor: °- Call [facility] at  832-8000 - they can help you locate a primary care doctor that  accepts your insurance, provides certain services, etc. °- Physician Referral Service- 1-800-533-3463 ° °Chronic Pain Problems: °Organization         Address  Phone   Notes  °[facility]  (336) 297-2271 Patients need to be referred by their primary care doctor.  ° °Medication  Assistance: °Organization         Address  Phone   Notes  °[place] [facility] [address] °[place], [place] [address] (336) 641-8030 --Must be a resident of [place] °-- Must have NO insurance coverage whatsoever (no Medicaid/ Medicare, etc.) °-- The pt. MUST have a primary care doctor that directs their care regularly and follows them in the community °  °[facility]  (866) 331-1348   °[employer]  (888) 892-1162   ° °Agencies that provide inexpensive medical care: °Organization         Address  Phone   Notes  °[facility]  (336) 832-8035   °[facility]    (336) 832-7272   °[facility] [address] °[place], [place] [address] (336) 832-4777   °[facility] [address], °[place] (336) 271-4999   °[facility]    (336) 373-0678   °[facility]    (336) 272-1050   °[facility] ° [address], [place] Phone:  (336) 832-4444, Fax:  (336) 832-4440 Hours of Operation:  9 am - 6 pm, M-F.  Also accepts Medicaid/Medicare and self-pay.  °[facility] ° [address],  Phone: (336) 832-3150, Fax: (336) 832-3151. Hours of Operation:  8:30 am - 5:30 pm, M-F.  Also accepts Medicaid and self-pay.  °[facility] [place] [address], [place] Phone: (336) 878-6027   °[facility] [address], [place], [place] (336)723-1848, Ext. 123 Mondays & Thursdays: 7-9 AM.  First 15 patients are seen on a first come, first serve basis. °  ° °Medicaid-accepting [place] Providers: ° °Organization         Address  Phone   Notes  °[facility] [address], [place] (336) 641-2100 Also accepts self-pay patients.  °[facility] [address], [place] ° (336) 856-9996   °[facility] [address], [place]  (336) 288-8857   °[facility] [address] [place] [address], [place] (336) 299-7000   °Veita Bland [address], [place]  ° (336) 373-1557 Only accepts [place] Access Medicaid patients after they have their name applied to their card.  ° °Self-Pay (no insurance) in [place]: ° °Organization         Address  Phone   Notes  °Sickle Cell Patients, [facility] [address], [place] (336) 832-1970   °[facility] [address], [place] (336) 832-4400   °[facility] ° [address], [place] (336) 992-4800   °[facility]/Dr. Osei-Bonsu ° [address] [place] [address], [place] or [address], [place] (336) 841-8500 Phone number for both [place] and [place] locations is the same.  °[facility] [address], [place] (336) 299-0000   °[facility] [address] [place] [address], [place] or [address] (336) 852-7530 °(336) 878-2260   °[facility] [address], [place] (336) 350-1642, phone; (336) 294-5005, fax Sees patients 1st and 3rd Saturday of every month.  Must not qualify for public or private insurance (i.e. Medicaid, Medicare, [place] Health Choice, Veterans' Benefits) • Household income should be no more than 200% of the poverty level •The clinic cannot treat you if you are pregnant or think you are pregnant • Sexually transmitted diseases are not treated at the clinic.  ° ° °Dental Care: °Organization         Address  Phone  Notes  °[place] [facility] [address],  (336) 641-6152 Accepts children up to age 21 who are enrolled in Medicaid or [place] Health Choice; pregnant women with a Medicaid card; and children who have applied for Medicaid or [place] Health Choice, but were declined, whose parents can pay a reduced fee at time of service.  °[place]  [facility] [place]  [address], [place] (336) 641-7733 Accepts children up to age 21 who are enrolled in Medicaid or [place] Health Choice; pregnant women with a Medicaid card; and children who have applied for Medicaid or  Health Choice, but were declined, whose parents can pay a reduced fee at time of service.  °[facility] ° [address],  (336) 641-4533 Patients are seen by appointment only. Walk-ins are not accepted. [facility] will see patients 18 years of age and older. °Monday - Tuesday (8am-5pm) °Most Wednesdays (8:30-5pm) °$30 per visit, cash only  °[facility] ° [address], [place] (336) 641-4533 Patients are seen by appointment only. Walk-ins are not accepted. [facility] will see patients 18 years of age and older. °One   Wednesday Evening (Monthly: Volunteer Based).  $30 per visit, cash only  °[facility]  (919) 537-3737 for adults; Children under age 4, call Graduate Pediatric Dentistry at (919) [phone]. Children aged 4-14, please call (919) [phone] to request a pediatric application. ° Dental services are provided in all areas of dental care including fillings, crowns and bridges, complete and partial dentures, implants, gum treatment, root canals, and extractions. Preventive care is also provided. Treatment is provided to both adults and children. °Patients are selected via a lottery and there is often a waiting list. °  °[facility] [address], °[place] ° (336) 763-8833 www.drcivils.com °  °[facility] [address], [place], [place] (336)723-1848, Ext. 123 Second and Fourth Thursday of each month, opens at 6:30 AM; Clinic ends at 9 AM.  Patients are seen on a first-come first-served basis, and a limited number are seen during each clinic.  ° °[facility] ° [address], [place], [place] (336) 723-7904    Eligibility Requirements °You must have lived in [place], or [place] counties for at least the last three months. °  You cannot be eligible for state or federal sponsored healthcare insurance, including Veterans Administration, Medicaid, or Medicare. °  You generally cannot be eligible for healthcare insurance through your employer.  °  How to apply: °Eligibility screenings are held every Tuesday and Wednesday afternoon from 1:00 pm until 4:00 pm. You do not need an appointment for the interview!  °[facility] [address], [place], [place] 336-631-2330   °[facility]  336-342-8273   °[facility]  336-703-3100   °[facility]  336-570-6415   ° °Behavioral Health Resources in the Community: °Intensive Outpatient Programs °Organization         Address  Phone  Notes  °[facility] [address], [place], [place] 336-878-6098   °[facility] [address], [place], [place] 336-832-9800   °[facility] [address], [place], [place] ° 336-882-2125   °[facility] [address],  °[place], [place] 1-800-853-5163 or 336-641-4981   °Substance Abuse Resources °Organization         Address  Phone  Notes  °[facility]  336-882-2125   °[facility]  336-784-9470   °[facility]  336-285-9073   °[facility]  336-845-3988   °Residential & Outpatient Substance Abuse Program  1-800-659-3381   °Psychological Services °Organization         Address  Phone  Notes  °[facility]  336- 832-9600   °[facility]  336- 378-7881   °[facility] [address], [place] 1-800-853-5163 or 336-641-4981   ° °Mobile Crisis Teams °Organization         Address  Phone  Notes  °[facility], Mobile Crisis Care Unit  1-877-626-1772   °Assertive °[facility] ° [address]  [place], [place] 336-834-9664   °Sharon DeEsch [address] °[place] 336-554-5454   ° °Self-Help/Support Groups °Organization         Address  Phone             Notes  °[facility] [place] - variety of support groups  336- 373-1402 Call for more information  °Narcotics Anonymous (NA), [facility] [address], °[place]  2 meetings at this location  ° °  Residential Treatment Programs Organization         Address  Phone  Notes  [facility] [address],    [place]  1-873-379-0421   [facility]  [address] [place] [address], [place], [place]   [place], [place] (817) 014-1520 Admissions: 8am-3pm M-F  [facility] [place] [address]    [place], [place] 014-103-0131   [facility] [address] [place], [place], [place]   [facility] [address],  [place], [place]   [facility] - Intensive Outpatient [address], [place], [place]   [facility]  [place], [place] 1-(843)392-6448 or 903-806-3833   [facility] [address], [place], [place] Accepts Medicaid  [facility] [address],  [place] 1-412-468-4068 Substance Abuse/Addiction Treatment   [facility]         Address  Phone  Notes  [facility]  917-862-0952   Domenic Schwab, PhD [address] [place], [place]   (979)556-2768 or 708-463-6677   [facility] [place], [place] (360)616-8116   [facility] [address], [place], [place] 704-089-9288 Insurance/Medicaid/sponsorship through [facility] Families [address]                                    [place], [place] [phone] [place] [address], [place] (978)[phone]    Dr. Adele Schilder  [phone]   [facility] Dept. 1) [address], [place] 2) [place] 3)  [address], [place] [phone] 647-747-2439  (913)[phone]   [place] 403-456-3978 or 434-535-1841 (After Hours)       Take over the counter tylenol, as directed on packaging, as needed for discomfort.  Elevate your feet as much as possible. Apply moist heat or ice to the area(s) of discomfort, for 15 minutes at a time, several times per day for the next few days.  Do not fall asleep on a heating or ice pack.  Call your regular medical doctor tomorrow to schedule a follow up appointment within the next 2 says.  Return to the Emergency Department immediately if worsening.

## 2013-11-04 NOTE — ED Notes (Signed)
Pt remains in xray.

## 2013-11-04 NOTE — ED Notes (Signed)
Pt states that a frozen chicken breast fell from the freezer and landed on her left foot two weeks ago, was seen at [facility] er, advised that xray was negative, pt continues to have pain and swelling to top of foot area, pt also states that her right foot started swelling on the top of her foot three days ago, denies any injury to right foot,

## 2013-11-06 ENCOUNTER — Encounter: Payer: Self-pay | Admitting: Family Medicine

## 2013-11-06 ENCOUNTER — Ambulatory Visit (INDEPENDENT_AMBULATORY_CARE_PROVIDER_SITE_OTHER): Payer: Medicare Other | Admitting: Family Medicine

## 2013-11-06 VITALS — BP 155/84 | HR 67 | Temp 98.5°F | Ht 63.0 in | Wt 154.0 lb

## 2013-11-06 DIAGNOSIS — S9032XA Contusion of left foot, initial encounter: Secondary | ICD-10-CM

## 2013-11-06 DIAGNOSIS — S9030XA Contusion of unspecified foot, initial encounter: Secondary | ICD-10-CM

## 2013-11-06 MED ORDER — MELOXICAM 15 MG PO TABS: 15.0000 mg | ORAL_TABLET | Freq: Every day | ORAL | Status: DC

## 2013-11-06 NOTE — Progress Notes (Signed)
   Subjective:    Patient ID: Jackie Hickman, female    DOB: 1932/04/23, 80 y.o.   MRN: [id number]  HPI C/o left foot dicomfort for 3 weeks.  She dropped a frozen chicken on her left foot and has been seen In the ED and had xrays and was told she has a contusion.  She is not walking very well and is  Sitting around alot   Review of Systems C/o left foot contusion   No chest pain, SOB, HA, dizziness, vision change, N/V, diarrhea, constipation, dysuria, urinary urgency or frequency, myalgias, arthralgias or rash.  Objective:   Physical Exam Vital signs noted  Elderly AA female in wheelchair.  HEENT - Head atraumatic Normocephalic                Eyes - PERRLA, Conjuctiva - clear Sclera- Clear EOMI                Ears - EAC's Wnl TM's Wnl Gross Hearing WNL                Throat - oropharanx wnl Respiratory - Lungs CTA bilateral Cardiac - RRR S1 and S2 without murmur MS - TTP left foot w/o deformity or swelling      Assessment & Plan:  Foot contusion, left, initial encounter - Plan: meloxicam (MOBIC) 15 MG tablet Walker 4 point and if not better will need referral to PT for ambulation  Lysbeth Penner FNP

## 2013-11-12 ENCOUNTER — Ambulatory Visit (INDEPENDENT_AMBULATORY_CARE_PROVIDER_SITE_OTHER): Payer: Medicare Other

## 2013-11-12 ENCOUNTER — Encounter: Payer: Self-pay | Admitting: Nurse Practitioner

## 2013-11-12 ENCOUNTER — Ambulatory Visit (INDEPENDENT_AMBULATORY_CARE_PROVIDER_SITE_OTHER): Payer: Medicare Other | Admitting: Nurse Practitioner

## 2013-11-12 VITALS — BP 146/84 | HR 61 | Temp 96.0°F

## 2013-11-12 DIAGNOSIS — S99922A Unspecified injury of left foot, initial encounter: Secondary | ICD-10-CM

## 2013-11-12 DIAGNOSIS — S99929A Unspecified injury of unspecified foot, initial encounter: Secondary | ICD-10-CM

## 2013-11-12 DIAGNOSIS — S8990XA Unspecified injury of unspecified lower leg, initial encounter: Secondary | ICD-10-CM

## 2013-11-12 DIAGNOSIS — S99919A Unspecified injury of unspecified ankle, initial encounter: Secondary | ICD-10-CM

## 2013-11-12 NOTE — Progress Notes (Signed)
   Subjective:    Patient ID: Jackie Hickman, female    DOB: July 18, 1932, 80 y.o.   MRN: [id number]  HPI Patient in saying that a couple of months ago a frozen chicken fell on her left foot. Patient has been c/o pain since then- Has been to the ER 2x and they said no fracture- Has had pain meds and that helps alleviate her pain some but she stll has a knot on the top of her foot and it hurts to walk on it.    Review of Systems  Respiratory: Negative.   Cardiovascular: Negative.   Psychiatric/Behavioral: Negative.   All other systems reviewed and are negative.      Objective:   Physical Exam  Constitutional: She is oriented to person, place, and time. She appears well-developed and well-nourished.  Cardiovascular: Normal rate, regular rhythm, normal heart sounds and intact distal pulses.   Pulmonary/Chest: Effort normal and breath sounds normal.  Musculoskeletal:  3cm tender cystic feeling mass top of right foot- Slight edema   Neurological: She is alert and oriented to person, place, and time.  Skin: Skin is warm and dry.  Psychiatric: She has a normal mood and affect. Her behavior is normal. Judgment and thought content normal.   BP 146/84  Pulse 61  Temp(Src) 96 F (35.6 C) (Oral)  Left foot x ray- no fracture small calcanel spur noted- Preliminary reading by Ronnald Collum, FNP  [facility]        Assessment & Plan:   1. Injury of left foot, initial encounter    mobic as rx Soak in epsom salt BID Orders Placed This Encounter  Procedures  . DG Foot Complete Left    Standing Status: Future     Number of Occurrences: 1     Standing Expiration Date: 01/12/2015    Order Specific Question:  Reason for Exam (SYMPTOM  OR DIAGNOSIS REQUIRED)    Answer:  left foot injury    Order Specific Question:  Preferred imaging location?    Answer:  Internal   Follow up prn  [name] Hassell Done, FNP

## 2013-11-30 ENCOUNTER — Emergency Department (HOSPITAL_COMMUNITY)
Admission: EM | Admit: 2013-11-30 | Discharge: 2013-12-01 | Disposition: A | Discharge status: Home / Self Care | Payer: Medicare Other | Attending: Emergency Medicine | Admitting: Emergency Medicine

## 2013-11-30 ENCOUNTER — Encounter (HOSPITAL_COMMUNITY): Payer: Self-pay | Admitting: Emergency Medicine

## 2013-11-30 DIAGNOSIS — R7989 Other specified abnormal findings of blood chemistry: Secondary | ICD-10-CM | POA: Insufficient documentation

## 2013-11-30 DIAGNOSIS — I10 Essential (primary) hypertension: Secondary | ICD-10-CM | POA: Diagnosis not present

## 2013-11-30 DIAGNOSIS — M109 Gout, unspecified: Secondary | ICD-10-CM | POA: Diagnosis not present

## 2013-11-30 DIAGNOSIS — M79609 Pain in unspecified limb: Secondary | ICD-10-CM | POA: Diagnosis present

## 2013-11-30 DIAGNOSIS — Z7982 Long term (current) use of aspirin: Secondary | ICD-10-CM | POA: Diagnosis not present

## 2013-11-30 DIAGNOSIS — M10071 Idiopathic gout, right ankle and foot: Secondary | ICD-10-CM

## 2013-11-30 DIAGNOSIS — E119 Type 2 diabetes mellitus without complications: Secondary | ICD-10-CM | POA: Insufficient documentation

## 2013-11-30 DIAGNOSIS — E785 Hyperlipidemia, unspecified: Secondary | ICD-10-CM | POA: Insufficient documentation

## 2013-11-30 DIAGNOSIS — IMO0002 Reserved for concepts with insufficient information to code with codable children: Secondary | ICD-10-CM | POA: Insufficient documentation

## 2013-11-30 DIAGNOSIS — Z79899 Other long term (current) drug therapy: Secondary | ICD-10-CM | POA: Diagnosis not present

## 2013-11-30 DIAGNOSIS — Z8669 Personal history of other diseases of the nervous system and sense organs: Secondary | ICD-10-CM | POA: Diagnosis not present

## 2013-11-30 DIAGNOSIS — E790 Hyperuricemia without signs of inflammatory arthritis and tophaceous disease: Secondary | ICD-10-CM

## 2013-11-30 MED ORDER — IBUPROFEN 400 MG PO TABS
400.0000 mg | ORAL_TABLET | Freq: Once | ORAL | Status: AC
  Administered 2013-11-30: 400 mg via ORAL
  Filled 2013-11-30: qty 1

## 2013-11-30 MED ORDER — TRAMADOL HCL 50 MG PO TABS
50.0000 mg | ORAL_TABLET | Freq: Once | ORAL | Status: AC
  Administered 2013-11-30: 50 mg via ORAL
  Filled 2013-11-30: qty 1

## 2013-11-30 NOTE — ED Provider Notes (Signed)
CSN: [id number]     Arrival date & time 11/30/13  2311 History   First MD Initiated Contact with Patient 11/30/13 2323    This chart was scribed for Delora Fuel, MD by Terressa Koyanagi, ED Scribe. This patient was seen in room APA09/APA09 and the patient's care was started at 11:27 PM.  Chief Complaint  Patient presents with  . Foot Pain   The history is provided by the patient and a relative. No language interpreter was used.   HPI Comments: PCP: Redge Gainer, MD  Jackie Hickman is a 80 y.o. female, with medical Hx noted below and significant for DM, HLD and HTN, who presents to the Emergency Department complaining of constant right foot pain onset 2 days ago. Pt rates her current pain an 8 out of 10. Pt denies any specific injury to the foot. Pt presented to, and was treated at, the ED for left foot pain on 11/04/13; pt reports her Sx with her left foot have resolved. Pt further reports she was prescribed antiinflammatory meds following the ED visit; and while on the antiinflammatories she was able to ambulate on her own and at times with the aid of a walker. However, since completing the antiinflammatory meds pt has not been able to ambulate.     Past Medical History  Diagnosis Date  . Glaucoma   . Diabetes mellitus   . Hyperlipidemia   . Hypertension    Past Surgical History  Procedure Laterality Date  . Appendectomy  1948  . Laparoscopic cholecystectomy  1980  . Eye surgery      bleeding and glaucoma   Family History  Problem Relation Age of Onset  . Colon cancer Neg Hx   . Stomach cancer Neg Hx    History  Substance Use Topics  . Smoking status: Never Smoker   . Smokeless tobacco: Never Used  . Alcohol Use: Yes     Comment: occasional drink   OB History   Grav Para Term Preterm Abortions TAB SAB Ect Mult Living                 Review of Systems  Constitutional: Negative for fever and chills.  Musculoskeletal:       Right foot pain and swelling   All other systems  reviewed and are negative.     Allergies  Review of patient's allergies indicates no known allergies.  Home Medications   Prior to Admission medications   Medication Sig Start Date End Date Taking? Authorizing Provider  acetaZOLAMIDE (DIAMOX) 500 MG capsule Take 500 mg by mouth 2 (two) times daily.    Historical Provider, MD  aspirin 81 MG tablet Take 81 mg by mouth daily.    Historical Provider, MD  atorvastatin (LIPITOR) 40 MG tablet Take 1 tablet (40 mg total) by mouth daily. 04/24/13   Erby Pian, FNP  brimonidine-timolol (COMBIGAN) 0.2-0.5 % ophthalmic solution Place 1 drop into both eyes every 12 (twelve) hours.    Historical Provider, MD  hydrochlorothiazide (HYDRODIURIL) 25 MG tablet Take 1 tablet (25 mg total) by mouth daily. 04/24/13   Erby Pian, FNP  latanoprost (XALATAN) 0.005 % ophthalmic solution Place 1 drop into both eyes at bedtime.    Historical Provider, MD  meloxicam (MOBIC) 15 MG tablet Take 1 tablet (15 mg total) by mouth daily. 11/06/13   Lysbeth Penner, FNP  metFORMIN (GLUCOPHAGE) 1000 MG tablet Take 1 tablet (1,000 mg total) by mouth 2 (two) times daily with  a meal. 04/24/13   Erby Pian, FNP  nebivolol (BYSTOLIC) 10 MG tablet Take 20 mg by mouth daily.    Historical Provider, MD  pilocarpine (PILOCAR) 4 % ophthalmic solution Place 1 drop into both eyes 4 (four) times daily.    Historical Provider, MD  prednisoLONE acetate (PRED FORTE) 1 % ophthalmic suspension Place 1 drop into the left eye 2 (two) times daily.     Historical Provider, MD   Triage Vitals: BP 158/77  Pulse 73  Temp(Src) 99.6 F (37.6 C) (Oral)  Resp 18  SpO2 100% Physical Exam  Nursing note and vitals reviewed. Constitutional: She is oriented to person, place, and time. She appears well-developed and well-nourished. No distress.  HENT:  Head: Normocephalic and atraumatic.  Eyes: Conjunctivae and EOM are normal. Pupils are equal, round, and reactive to light.  Neck: Normal  range of motion. Neck supple. No JVD present. No tracheal deviation present.  Cardiovascular: Normal rate, regular rhythm and normal heart sounds.   No murmur heard. Pulmonary/Chest: Effort normal and breath sounds normal. She has no wheezes. She has no rales.  Abdominal: Soft. Bowel sounds are normal. She exhibits no distension and no mass. There is no tenderness.  Musculoskeletal: Normal range of motion. She exhibits edema and tenderness.  Mild swelling and warmth of the right foot. Diffusely moderate tenderness of right foot.   Lymphadenopathy:    She has no cervical adenopathy.  Neurological: She is alert and oriented to person, place, and time. She has normal reflexes. No cranial nerve deficit. Coordination normal.  Skin: Skin is warm and dry. No rash noted.  Psychiatric: She has a normal mood and affect. Her behavior is normal. Thought content normal.    ED Course  Procedures (including critical care time) DIAGNOSTIC STUDIES: Oxygen Saturation is 100% on RA, nl by my interpretation.    COORDINATION OF CARE: 11:32 PM-Discussed treatment plan which includes meds with pt at bedside and pt agreed to plan.   Labs Review Results for orders placed during the hospital encounter of 11/30/13  URIC ACID      Result Value Ref Range   Uric Acid, Serum 10.9 (*) 2.4 - 7.0 mg/dL   MDM   Final diagnoses:  Acute idiopathic gout of right foot  Elevated blood uric acid level    Right foot pain suspicious for gout. Old records are reviewed and I cannot find any uric acid levels having been obtained on the patient. She is given a dose of tramadol and ibuprofen. Uric acid does come back elevated at 10.9 which supports a presumptive diagnosis of gout. She is a diabetic but reasonably well-controlled. I chose to he could tolerate a short course of prednisone. She is discharged with prescription for oxycodone and acetaminophen and prednisone. She had been given a prescription for meloxicam when she  saw her PCP several weeks ago. She is advised to resume taking that.  I personally performed the services described in this documentation, which was scribed in my presence. The recorded information has been reviewed and is accurate.     Delora Fuel, MD 40/98/11 9147

## 2013-11-30 NOTE — ED Notes (Signed)
Pt c/o rt foot pain x 2 days and denies any injury.

## 2013-11-30 NOTE — ED Notes (Signed)
Patient states she did not take any of her medicine today. States that she took extra strength tylenol and did not know if she could take these medicines with the tylenol.

## 2013-12-01 LAB — URIC ACID: Uric Acid, Serum: 10.9 mg/dL — ABNORMAL HIGH (ref 2.4–7.0)

## 2013-12-01 MED ORDER — OXYCODONE-ACETAMINOPHEN 5-325 MG PO TABS: 1.0000 | ORAL_TABLET | ORAL | Status: DC | PRN

## 2013-12-01 MED ORDER — PREDNISONE 20 MG PO TABS
40.0000 mg | ORAL_TABLET | Freq: Once | ORAL | Status: AC
  Administered 2013-12-01: 40 mg via ORAL
  Filled 2013-12-01: qty 2

## 2013-12-01 MED ORDER — PREDNISONE 50 MG PO TABS: 50.0000 mg | ORAL_TABLET | Freq: Every day | ORAL | Status: DC

## 2013-12-01 NOTE — Discharge Instructions (Signed)
Resume taking your meloxicam once a day. Monitor your blood sugar while you are taking Prednisone - it can make your blood sugar go higher.   Gout Gout is an inflammatory arthritis caused by a buildup of uric acid crystals in the joints. Uric acid is a chemical that is normally present in the blood. When the level of uric acid in the blood is too high it can form crystals that deposit in your joints and tissues. This causes joint redness, soreness, and swelling (inflammation). Repeat attacks are common. Over time, uric acid crystals can form into masses (tophi) near a joint, destroying bone and causing disfigurement. Gout is treatable and often preventable. CAUSES  The disease begins with elevated levels of uric acid in the blood. Uric acid is produced by your body when it breaks down a naturally found substance called purines. Certain foods you eat, such as meats and fish, contain high amounts of purines. Causes of an elevated uric acid level include:  Being passed down from parent to child (heredity).  Diseases that cause increased uric acid production (such as obesity, psoriasis, and certain cancers).  Excessive alcohol use.  Diet, especially diets rich in meat and seafood.  Medicines, including certain cancer-fighting medicines (chemotherapy), water pills (diuretics), and aspirin.  Chronic kidney disease. The kidneys are no longer able to remove uric acid well.  Problems with metabolism. Conditions strongly associated with gout include:  Obesity.  High blood pressure.  High cholesterol.  Diabetes. Not everyone with elevated uric acid levels gets gout. It is not understood why some people get gout and others do not. Surgery, joint injury, and eating too much of certain foods are some of the factors that can lead to gout attacks. SYMPTOMS   An attack of gout comes on quickly. It causes intense pain with redness, swelling, and warmth in a joint.  Fever can occur.  Often, only  one joint is involved. Certain joints are more commonly involved:  Base of the big toe.  Knee.  Ankle.  Wrist.  Finger. Without treatment, an attack usually goes away in a few days to weeks. Between attacks, you usually will not have symptoms, which is different from many other forms of arthritis. DIAGNOSIS  Your caregiver will suspect gout based on your symptoms and exam. In some cases, tests may be recommended. The tests may include:  Blood tests.  Urine tests.  X-rays.  Joint fluid exam. This exam requires a needle to remove fluid from the joint (arthrocentesis). Using a microscope, gout is confirmed when uric acid crystals are seen in the joint fluid. TREATMENT  There are two phases to gout treatment: treating the sudden onset (acute) attack and preventing attacks (prophylaxis).  Treatment of an Acute Attack.  Medicines are used. These include anti-inflammatory medicines or steroid medicines.  An injection of steroid medicine into the affected joint is sometimes necessary.  The painful joint is rested. Movement can worsen the arthritis.  You may use warm or cold treatments on painful joints, depending which works best for you.  Treatment to Prevent Attacks.  If you suffer from frequent gout attacks, your caregiver may advise preventive medicine. These medicines are started after the acute attack subsides. These medicines either help your kidneys eliminate uric acid from your body or decrease your uric acid production. You may need to stay on these medicines for a very long time.  The early phase of treatment with preventive medicine can be associated with an increase in acute gout attacks. For  this reason, during the first few months of treatment, your caregiver may also advise you to take medicines usually used for acute gout treatment. Be sure you understand your caregiver's directions. Your caregiver may make several adjustments to your medicine dose before these  medicines are effective.  Discuss dietary treatment with your caregiver or dietitian. Alcohol and drinks high in sugar and fructose and foods such as meat, poultry, and seafood can increase uric acid levels. Your caregiver or dietitian can advise you on drinks and foods that should be limited. HOME CARE INSTRUCTIONS   Do not take aspirin to relieve pain. This raises uric acid levels.  Only take over-the-counter or prescription medicines for pain, discomfort, or fever as directed by your caregiver.  Rest the joint as much as possible. When in bed, keep sheets and blankets off painful areas.  Keep the affected joint raised (elevated).  Apply warm or cold treatments to painful joints. Use of warm or cold treatments depends on which works best for you.  Use crutches if the painful joint is in your leg.  Drink enough fluids to keep your urine clear or pale yellow. This helps your body get rid of uric acid. Limit alcohol, sugary drinks, and fructose drinks.  Follow your dietary instructions. Pay careful attention to the amount of protein you eat. Your daily diet should emphasize fruits, vegetables, whole grains, and fat-free or low-fat milk products. Discuss the use of coffee, vitamin C, and cherries with your caregiver or dietitian. These may be helpful in lowering uric acid levels.  Maintain a healthy body weight. SEEK MEDICAL CARE IF:   You develop diarrhea, vomiting, or any side effects from medicines.  You do not feel better in 24 hours, or you are getting worse. SEEK IMMEDIATE MEDICAL CARE IF:   Your joint becomes suddenly more tender, and you have chills or a fever. MAKE SURE YOU:   Understand these instructions.  Will watch your condition.  Will get help right away if you are not doing well or get worse. Document Released: 03/19/2000 Document Revised: 08/06/2013 Document Reviewed: 11/03/2011 [facility] Patient Information 2015 [place], [place]. This information is not intended to  replace advice given to you by your health care provider. Make sure you discuss any questions you have with your health care provider.  Prednisone tablets What is this medicine? PREDNISONE (PRED ni sone) is a corticosteroid. It is commonly used to treat inflammation of the skin, joints, lungs, and other organs. Common conditions treated include asthma, allergies, and arthritis. It is also used for other conditions, such as blood disorders and diseases of the adrenal glands. This medicine may be used for other purposes; ask your health care provider or pharmacist if you have questions. COMMON BRAND NAME(S): Deltasone, Predone, Sterapred, Sterapred DS What should I tell my health care provider before I take this medicine? They need to know if you have any of these conditions: -Cushing's syndrome -diabetes -glaucoma -heart disease -high blood pressure -infection (especially a virus infection such as chickenpox, cold sores, or herpes) -kidney disease -liver disease -mental illness -myasthenia gravis -osteoporosis -seizures -stomach or intestine problems -thyroid disease -an unusual or allergic reaction to lactose, prednisone, other medicines, foods, dyes, or preservatives -pregnant or trying to get pregnant -breast-feeding How should I use this medicine? Take this medicine by mouth with a glass of water. Follow the directions on the prescription label. Take this medicine with food. If you are taking this medicine once a day, take it in the morning. Do  not take more medicine than you are told to take. Do not suddenly stop taking your medicine because you may develop a severe reaction. Your doctor will tell you how much medicine to take. If your doctor wants you to stop the medicine, the dose may be slowly lowered over time to avoid any side effects. Talk to your pediatrician regarding the use of this medicine in children. Special care may be needed. Overdosage: If you think you have taken too  much of this medicine contact a poison control center or emergency room at once. NOTE: This medicine is only for you. Do not share this medicine with others. What if I miss a dose? If you miss a dose, take it as soon as you can. If it is almost time for your next dose, talk to your doctor or health care professional. You may need to miss a dose or take an extra dose. Do not take double or extra doses without advice. What may interact with this medicine? Do not take this medicine with any of the following medications: -metyrapone -mifepristone This medicine may also interact with the following medications: -aminoglutethimide -amphotericin B -aspirin and aspirin-like medicines -barbiturates -certain medicines for diabetes, like glipizide or glyburide -cholestyramine -cholinesterase inhibitors -cyclosporine -digoxin -diuretics -ephedrine -female hormones, like estrogens and birth control pills -isoniazid -ketoconazole -NSAIDS, medicines for pain and inflammation, like ibuprofen or naproxen -phenytoin -rifampin -toxoids -vaccines -warfarin This list may not describe all possible interactions. Give your health care provider a list of all the medicines, herbs, non-prescription drugs, or dietary supplements you use. Also tell them if you smoke, drink alcohol, or use illegal drugs. Some items may interact with your medicine. What should I watch for while using this medicine? Visit your doctor or health care professional for regular checks on your progress. If you are taking this medicine over a prolonged period, carry an identification card with your name and address, the type and dose of your medicine, and your doctor's name and address. This medicine may increase your risk of getting an infection. Tell your doctor or health care professional if you are around anyone with measles or chickenpox, or if you develop sores or blisters that do not heal properly. If you are going to have surgery,  tell your doctor or health care professional that you have taken this medicine within the last twelve months. Ask your doctor or health care professional about your diet. You may need to lower the amount of salt you eat. This medicine may affect blood sugar levels. If you have diabetes, check with your doctor or health care professional before you change your diet or the dose of your diabetic medicine. What side effects may I notice from receiving this medicine? Side effects that you should report to your doctor or health care professional as soon as possible: -allergic reactions like skin rash, itching or hives, swelling of the face, lips, or tongue -changes in emotions or moods -changes in vision -depressed mood -eye pain -fever or chills, cough, sore throat, pain or difficulty passing urine -increased thirst -swelling of ankles, feet Side effects that usually do not require medical attention (report to your doctor or health care professional if they continue or are bothersome): -confusion, excitement, restlessness -headache -nausea, vomiting -skin problems, acne, thin and shiny skin -trouble sleeping -weight gain This list may not describe all possible side effects. Call your doctor for medical advice about side effects. You may report side effects to FDA at 1-800-FDA-1088. Where should I keep  my medicine? Keep out of the reach of children. Store at room temperature between 15 and 30 degrees C (59 and 86 degrees F). Protect from light. Keep container tightly closed. Throw away any unused medicine after the expiration date. NOTE: This sheet is a summary. It may not cover all possible information. If you have questions about this medicine, talk to your doctor, pharmacist, or health care provider.  2015, Elsevier/Gold Standard. (2010-11-05 10:57:14)  Acetaminophen; Oxycodone tablets What is this medicine? ACETAMINOPHEN; OXYCODONE (a set a MEE noe fen; ox i KOE done) is a pain reliever.  It is used to treat mild to moderate pain. This medicine may be used for other purposes; ask your health care provider or pharmacist if you have questions. COMMON BRAND NAME(S): Endocet, Magnacet, Narvox, Percocet, Perloxx, Primalev, Primlev, Roxicet, Xolox What should I tell my health care provider before I take this medicine? They need to know if you have any of these conditions: -brain tumor -Crohn's disease, inflammatory bowel disease, or ulcerative colitis -drug abuse or addiction -head injury -heart or circulation problems -if you often drink alcohol -kidney disease or problems going to the bathroom -liver disease -lung disease, asthma, or breathing problems -an unusual or allergic reaction to acetaminophen, oxycodone, other opioid analgesics, other medicines, foods, dyes, or preservatives -pregnant or trying to get pregnant -breast-feeding How should I use this medicine? Take this medicine by mouth with a full glass of water. Follow the directions on the prescription label. Take your medicine at regular intervals. Do not take your medicine more often than directed. Talk to your pediatrician regarding the use of this medicine in children. Special care may be needed. Patients over 59 years old may have a stronger reaction and need a smaller dose. Overdosage: If you think you have taken too much of this medicine contact a poison control center or emergency room at once. NOTE: This medicine is only for you. Do not share this medicine with others. What if I miss a dose? If you miss a dose, take it as soon as you can. If it is almost time for your next dose, take only that dose. Do not take double or extra doses. What may interact with this medicine? -alcohol -antihistamines -barbiturates like amobarbital, butalbital, butabarbital, methohexital, pentobarbital, phenobarbital, thiopental, and secobarbital -benztropine -drugs for bladder problems like solifenacin, trospium, oxybutynin,  tolterodine, hyoscyamine, and methscopolamine -drugs for breathing problems like ipratropium and tiotropium -drugs for certain stomach or intestine problems like propantheline, homatropine methylbromide, glycopyrrolate, atropine, belladonna, and dicyclomine -general anesthetics like etomidate, ketamine, nitrous oxide, propofol, desflurane, enflurane, halothane, isoflurane, and sevoflurane -medicines for depression, anxiety, or psychotic disturbances -medicines for sleep -muscle relaxants -naltrexone -narcotic medicines (opiates) for pain -phenothiazines like perphenazine, thioridazine, chlorpromazine, mesoridazine, fluphenazine, prochlorperazine, promazine, and trifluoperazine -scopolamine -tramadol -trihexyphenidyl This list may not describe all possible interactions. Give your health care provider a list of all the medicines, herbs, non-prescription drugs, or dietary supplements you use. Also tell them if you smoke, drink alcohol, or use illegal drugs. Some items may interact with your medicine. What should I watch for while using this medicine? Tell your doctor or health care professional if your pain does not go away, if it gets worse, or if you have new or a different type of pain. You may develop tolerance to the medicine. Tolerance means that you will need a higher dose of the medication for pain relief. Tolerance is normal and is expected if you take this medicine for a long time. Do not  suddenly stop taking your medicine because you may develop a severe reaction. Your body becomes used to the medicine. This does NOT mean you are addicted. Addiction is a behavior related to getting and using a drug for a non-medical reason. If you have pain, you have a medical reason to take pain medicine. Your doctor will tell you how much medicine to take. If your doctor wants you to stop the medicine, the dose will be slowly lowered over time to avoid any side effects. You may get drowsy or dizzy. Do not  drive, use machinery, or do anything that needs mental alertness until you know how this medicine affects you. Do not stand or sit up quickly, especially if you are an older patient. This reduces the risk of dizzy or fainting spells. Alcohol may interfere with the effect of this medicine. Avoid alcoholic drinks. There are different types of narcotic medicines (opiates) for pain. If you take more than one type at the same time, you may have more side effects. Give your health care provider a list of all medicines you use. Your doctor will tell you how much medicine to take. Do not take more medicine than directed. Call emergency for help if you have problems breathing. The medicine will cause constipation. Try to have a bowel movement at least every 2 to 3 days. If you do not have a bowel movement for 3 days, call your doctor or health care professional. Do not take Tylenol (acetaminophen) or medicines that have acetaminophen with this medicine. Too much acetaminophen can be very dangerous. Many nonprescription medicines contain acetaminophen. Always read the labels carefully to avoid taking more acetaminophen. What side effects may I notice from receiving this medicine? Side effects that you should report to your doctor or health care professional as soon as possible: -allergic reactions like skin rash, itching or hives, swelling of the face, lips, or tongue -breathing difficulties, wheezing -confusion -light headedness or fainting spells -severe stomach pain -unusually weak or tired -yellowing of the skin or the whites of the eyes Side effects that usually do not require medical attention (report to your doctor or health care professional if they continue or are bothersome): -dizziness -drowsiness -nausea -vomiting This list may not describe all possible side effects. Call your doctor for medical advice about side effects. You may report side effects to FDA at 1-800-FDA-1088. Where should I keep  my medicine? Keep out of the reach of children. This medicine can be abused. Keep your medicine in a safe place to protect it from theft. Do not share this medicine with anyone. Selling or giving away this medicine is dangerous and against the law. Store at room temperature between 20 and 25 degrees C (68 and 77 degrees F). Keep container tightly closed. Protect from light. This medicine may cause accidental overdose and death if it is taken by other adults, children, or pets. Flush any unused medicine down the toilet to reduce the chance of harm. Do not use the medicine after the expiration date. NOTE: This sheet is a summary. It may not cover all possible information. If you have questions about this medicine, talk to your doctor, pharmacist, or health care provider.  2015, Elsevier/Gold Standard. (2012-11-13 13:17:35)

## 2013-12-05 MED FILL — Hydrocodone-Acetaminophen Tab 5-325 MG: ORAL | Qty: 6 | Status: AC

## 2013-12-13 ENCOUNTER — Telehealth: Payer: Self-pay | Admitting: Family Medicine

## 2013-12-13 NOTE — Telephone Encounter (Signed)
ntbs in order to get refill on atorvastatinp

## 2013-12-13 NOTE — Telephone Encounter (Signed)
Only seen mae in Jan. 2015

## 2014-01-25 ENCOUNTER — Encounter: Payer: Self-pay | Admitting: Family Medicine

## 2014-01-25 ENCOUNTER — Ambulatory Visit (INDEPENDENT_AMBULATORY_CARE_PROVIDER_SITE_OTHER): Payer: Medicare Other | Admitting: Family Medicine

## 2014-01-25 VITALS — BP 174/84 | HR 73 | Temp 98.4°F | Ht 63.0 in | Wt 150.8 lb

## 2014-01-25 DIAGNOSIS — E119 Type 2 diabetes mellitus without complications: Secondary | ICD-10-CM

## 2014-01-25 DIAGNOSIS — E1122 Type 2 diabetes mellitus with diabetic chronic kidney disease: Secondary | ICD-10-CM

## 2014-01-25 DIAGNOSIS — E785 Hyperlipidemia, unspecified: Secondary | ICD-10-CM

## 2014-01-25 DIAGNOSIS — I10 Essential (primary) hypertension: Secondary | ICD-10-CM

## 2014-01-25 DIAGNOSIS — N189 Chronic kidney disease, unspecified: Secondary | ICD-10-CM

## 2014-01-25 LAB — POCT GLYCOSYLATED HEMOGLOBIN (HGB A1C): HEMOGLOBIN A1C: 6.3

## 2014-01-25 LAB — POCT UA - MICROALBUMIN: MICROALBUMIN (UR) POC: 50 mg/L

## 2014-01-25 MED ORDER — NEBIVOLOL HCL 10 MG PO TABS: 20.0000 mg | ORAL_TABLET | Freq: Every day | ORAL | Status: DC

## 2014-01-25 MED ORDER — LOSARTAN POTASSIUM 50 MG PO TABS: 50.0000 mg | ORAL_TABLET | Freq: Every day | ORAL | Status: DC

## 2014-01-25 MED ORDER — ATORVASTATIN CALCIUM 40 MG PO TABS: 40.0000 mg | ORAL_TABLET | Freq: Every day | ORAL | Status: DC

## 2014-01-25 MED ORDER — METFORMIN HCL 1000 MG PO TABS: 1000.0000 mg | ORAL_TABLET | Freq: Two times a day (BID) | ORAL | Status: DC

## 2014-01-25 NOTE — Addendum Note (Signed)
Addended by: Earlene Plater on: 01/25/2014 03:36 PM   Modules accepted: Orders

## 2014-01-25 NOTE — Progress Notes (Signed)
   Subjective:    Patient ID: Jackie Hickman, female    DOB: 1932-09-23, 80 y.o.   MRN: [id number]  HPI    Review of Systems     Objective:   Physical Exam  BP 174/84  Pulse 73  Temp(Src) 98.4 F (36.9 C) (Oral)  Ht 5\' 3"  (1.6 m)  Wt 150 lb 12.8 oz (68.402 kg)  BMI 26.72 kg/m2      Assessment & Plan:

## 2014-01-26 LAB — CMP14+EGFR
A/G RATIO: 1.2 (ref 1.1–2.5)
ALBUMIN: 3.6 g/dL (ref 3.5–4.8)
ALT: 6 IU/L (ref 0–32)
AST: 8 IU/L (ref 0–40)
Alkaline Phosphatase: 61 IU/L (ref 39–117)
BUN / CREAT RATIO: 12 (ref 11–26)
BUN: 14 mg/dL (ref 8–27)
CALCIUM: 9.5 mg/dL (ref 8.7–10.3)
CO2: 25 mmol/L (ref 18–29)
CREATININE: 1.14 mg/dL — AB (ref 0.57–1.00)
Chloride: 99 mmol/L (ref 97–108)
GFR calc Af Amer: 53 mL/min/{1.73_m2} — ABNORMAL LOW (ref >59)
GFR calc non Af Amer: 46 mL/min/{1.73_m2} — ABNORMAL LOW (ref >59)
GLOBULIN, TOTAL: 3 g/dL (ref 1.5–4.5)
Glucose: 177 mg/dL — ABNORMAL HIGH (ref 65–99)
Potassium: 3.5 mmol/L (ref 3.5–5.2)
Sodium: 144 mmol/L (ref 134–144)
Total Bilirubin: 0.2 mg/dL (ref 0.0–1.2)
Total Protein: 6.6 g/dL (ref 6.0–8.5)

## 2014-01-26 LAB — LIPID PANEL
CHOL/HDL RATIO: 4.5 ratio — AB (ref 0.0–4.4)
Cholesterol, Total: 190 mg/dL (ref 100–199)
HDL: 42 mg/dL (ref >39)
LDL CALC: 121 mg/dL — AB (ref 0–99)
TRIGLYCERIDES: 136 mg/dL (ref 0–149)
VLDL CHOLESTEROL CAL: 27 mg/dL (ref 5–40)

## 2014-01-26 LAB — MICROALBUMIN, URINE: Microalbumin, Urine: 435.1 ug/mL — ABNORMAL HIGH (ref 0.0–17.0)

## 2014-01-26 LAB — URIC ACID: Uric Acid: 7.7 mg/dL — ABNORMAL HIGH (ref 2.5–7.1)

## 2014-01-31 ENCOUNTER — Telehealth: Payer: Self-pay | Admitting: *Deleted

## 2014-01-31 DIAGNOSIS — N289 Disorder of kidney and ureter, unspecified: Secondary | ICD-10-CM

## 2014-01-31 NOTE — Telephone Encounter (Signed)
Pt notified of results [facility] understanding Nephrology referral entered

## 2014-01-31 NOTE — Telephone Encounter (Signed)
Message copied by Marin Olp on Thu Jan 31, 2014 10:11 AM ------      Message from: Wardell Honour      Created: Thu Jan 31, 2014  7:57 AM       Lipids show no change but renal function has declined; she is on arb to protect kidneys; would she like to see a nephrologist to see if other treatment is needed ------

## 2014-02-07 NOTE — Progress Notes (Signed)
Labs are about the same but there is some decline in renal function. Uric acid level is elevated and if she is interested we could put her on allopurinol to try lower these levels. Dosage should be 300 mg per day

## 2014-02-07 NOTE — Progress Notes (Addendum)
   Subjective:    Patient ID: Jackie Hickman, female    DOB: [birth year], 80 y.o.   MRN: [id number]  HPI 80 year old lady with follow-up for her diabetes hyperlipidemia hypertension. She needs refills on all her medications today. Reports to doing well with compliance her meds and diet. She does complain of some today of some swelling and pain in her feet. There is some increased temperature concerning for gout.    Review of Systems  Constitutional: Negative.   HENT: Negative.   Eyes: Negative.   Respiratory: Negative.   Cardiovascular: Negative.   Gastrointestinal: Negative.   Endocrine: Negative.   Genitourinary: Negative.   Musculoskeletal: Positive for myalgias.       Bilateral thumb pain  Skin: Rash: not true rash but healing intertrigo.  Hematological: Negative.   Psychiatric/Behavioral: Negative.        Objective:   Physical Exam  Constitutional: She is oriented to person, place, and time. She appears well-developed and well-nourished.  Eyes: Conjunctivae and EOM are normal.  Neck: Normal range of motion. Neck supple.  Cardiovascular: Normal rate, regular rhythm and normal heart sounds.   Pulmonary/Chest: Effort normal and breath sounds normal.  Abdominal: Soft. Bowel sounds are normal.  Musculoskeletal: Normal range of motion.  Neurological: She is alert and oriented to person, place, and time. She has normal reflexes.  Skin: Skin is warm and dry.  Psychiatric: She has a normal mood and affect. Her behavior is normal. Thought content normal.          Assessment & Plan:  1. Essential hypertension  - CMP14+EGFR  2. Type 2 diabetes mellitus without complication  - POCT glycosylated hemoglobin (Hb A1C) - POCT UA - Microalbumin - Uric acid - Microalbumin, urine  3. Hyperlipidemia  - Lipid panel - Uric acid - atorvastatin (LIPITOR) 40 MG tablet; Take 1 tablet (40 mg total) by mouth daily.  Dispense: 30 tablet; Refill: 3  4. Type 2 diabetes mellitus with  diabetic chronic kidney disease  - metFORMIN (GLUCOPHAGE) 1000 MG tablet; Take 1 tablet (1,000 mg total) by mouth 2 (two) times daily with a meal.  Dispense: 60 tablet; Refill: 3  Wardell Honour MD

## 2014-04-15 ENCOUNTER — Ambulatory Visit (INDEPENDENT_AMBULATORY_CARE_PROVIDER_SITE_OTHER): Payer: Medicare Other | Admitting: Nurse Practitioner

## 2014-04-15 ENCOUNTER — Encounter: Payer: Self-pay | Admitting: Nurse Practitioner

## 2014-04-15 VITALS — BP 184/91 | HR 66 | Temp 96.7°F | Ht 63.0 in | Wt 150.0 lb

## 2014-04-15 DIAGNOSIS — I10 Essential (primary) hypertension: Secondary | ICD-10-CM | POA: Diagnosis not present

## 2014-04-15 MED ORDER — LOSARTAN POTASSIUM 100 MG PO TABS: 100.0000 mg | ORAL_TABLET | Freq: Every day | ORAL | Status: DC

## 2014-04-15 MED ORDER — AMLODIPINE BESYLATE 5 MG PO TABS: 5.0000 mg | ORAL_TABLET | Freq: Every day | ORAL | Status: DC

## 2014-04-15 NOTE — Patient Instructions (Signed)

## 2014-04-15 NOTE — Progress Notes (Signed)
   Subjective:    Patient ID: SELISA TENSLEY, female    DOB: [birth year], 81 y.o.   MRN: [id number]  HPI Patient brought in by her daughter. SHe was scheduled to have eye surgery to relieve pressure in eye but her blood pressure was to high and they would not do surgery. Family does not check blood pressure at home and did not that it was running this high. SHe does not c/o headache or blurred vision.    Review of Systems  Constitutional: Negative.  Negative for chills and appetite change.  HENT: Negative.   Respiratory: Negative.   Cardiovascular: Negative.  Negative for chest pain, palpitations and leg swelling.  Gastrointestinal: Negative.   Genitourinary: Negative.   Neurological: Negative for dizziness and headaches.  Psychiatric/Behavioral: Negative.   All other systems reviewed and are negative.      Objective:   Physical Exam  Constitutional: She is oriented to person, place, and time. She appears well-developed and well-nourished. No distress.  Cardiovascular: Normal rate, regular rhythm and normal heart sounds.   Pulmonary/Chest: Effort normal and breath sounds normal.  Neurological: She is alert and oriented to person, place, and time.  Skin: Skin is warm and dry.  Psychiatric: She has a normal mood and affect. Her behavior is normal. Judgment and thought content normal.   BP 184/91 mmHg  Pulse 66  Temp(Src) 96.7 F (35.9 C) (Oral)  Ht _0  (1.6 m)  Wt 150 lb (68.04 kg)  BMI 26.58 kg/m2        Assessment & Plan:  1. Essential hypertension Increase losartan to 186m daily and added amlodipine Do not add salt to diet Labs pending RTO in 2 weeks - losartan (COZAAR) 100 MG tablet; Take 1 tablet (100 mg total) by mouth daily.  Dispense: 90 tablet; Refill: 1 - amLODipine (NORVASC) 5 MG tablet; Take 1 tablet (5 mg total) by mouth daily.  Dispense: 90 tablet; Refill: 1 - [place] [facility]

## 2014-04-16 LAB — CMP14+EGFR
ALBUMIN: 4.2 g/dL (ref 3.5–4.8)
ALK PHOS: 57 IU/L (ref 39–117)
ALT: 5 IU/L (ref 0–32)
AST: 8 IU/L (ref 0–40)
Albumin/Globulin Ratio: 1.4 (ref 1.1–2.5)
BUN/Creatinine Ratio: 24 (ref 11–26)
BUN: 36 mg/dL — AB (ref 8–27)
CHLORIDE: 112 mmol/L — AB (ref 97–108)
CO2: 18 mmol/L (ref 18–29)
Calcium: 9.8 mg/dL (ref 8.7–10.3)
Creatinine, Ser: 1.47 mg/dL — ABNORMAL HIGH (ref 0.57–1.00)
GFR calc non Af Amer: 34 mL/min/{1.73_m2} — ABNORMAL LOW (ref >59)
GFR, EST AFRICAN AMERICAN: 39 mL/min/{1.73_m2} — AB (ref >59)
GLUCOSE: 145 mg/dL — AB (ref 65–99)
Globulin, Total: 3 g/dL (ref 1.5–4.5)
POTASSIUM: 5 mmol/L (ref 3.5–5.2)
Sodium: 145 mmol/L — ABNORMAL HIGH (ref 134–144)
TOTAL PROTEIN: 7.2 g/dL (ref 6.0–8.5)

## 2014-04-29 ENCOUNTER — Ambulatory Visit: Payer: Medicare Other | Admitting: Nurse Practitioner

## 2014-05-03 ENCOUNTER — Encounter: Payer: Self-pay | Admitting: Nurse Practitioner

## 2014-05-03 ENCOUNTER — Telehealth: Payer: Self-pay | Admitting: Pharmacist

## 2014-05-03 ENCOUNTER — Ambulatory Visit (INDEPENDENT_AMBULATORY_CARE_PROVIDER_SITE_OTHER): Payer: Medicare Other | Admitting: Nurse Practitioner

## 2014-05-03 VITALS — BP 152/92 | HR 80 | Temp 96.8°F | Ht 63.0 in | Wt 152.0 lb

## 2014-05-03 DIAGNOSIS — E1122 Type 2 diabetes mellitus with diabetic chronic kidney disease: Secondary | ICD-10-CM | POA: Diagnosis not present

## 2014-05-03 DIAGNOSIS — E785 Hyperlipidemia, unspecified: Secondary | ICD-10-CM | POA: Diagnosis not present

## 2014-05-03 DIAGNOSIS — Z13820 Encounter for screening for osteoporosis: Secondary | ICD-10-CM | POA: Diagnosis not present

## 2014-05-03 DIAGNOSIS — I10 Essential (primary) hypertension: Secondary | ICD-10-CM

## 2014-05-03 DIAGNOSIS — N189 Chronic kidney disease, unspecified: Secondary | ICD-10-CM

## 2014-05-03 LAB — POCT GLYCOSYLATED HEMOGLOBIN (HGB A1C): Hemoglobin A1C: 6.5

## 2014-05-03 MED ORDER — NEBIVOLOL HCL 10 MG PO TABS: 20.0000 mg | ORAL_TABLET | Freq: Every day | ORAL | Status: DC

## 2014-05-03 MED ORDER — ATORVASTATIN CALCIUM 40 MG PO TABS: 40.0000 mg | ORAL_TABLET | Freq: Every day | ORAL | Status: DC

## 2014-05-03 MED ORDER — METFORMIN HCL 1000 MG PO TABS: 1000.0000 mg | ORAL_TABLET | Freq: Two times a day (BID) | ORAL | Status: DC

## 2014-05-03 NOTE — Patient Instructions (Signed)

## 2014-05-03 NOTE — Telephone Encounter (Signed)
Surgery is scheduled for 05/13/2014 - recommend hold ASA starting 7 days prior.  Last dose of ASA will be 05/05/2014.  Restart ASA the day following surgery as long as hemodynamically stable.   Rennis Golden with instructions.

## 2014-05-03 NOTE — Telephone Encounter (Signed)
Patient also notified.

## 2014-05-03 NOTE — Progress Notes (Signed)
Subjective:    Patient ID: Jackie Hickman, female    DOB: 04-15-1932, 81 y.o.   MRN: [id number]   Patient here today for follow up of chronic medical problems. No complaints today. * some confusion  about blood pressure meds.  Hypertension This is a chronic problem. The current episode started more than 1 year ago. The problem is unchanged. The problem is uncontrolled. Risk factors for coronary artery disease include diabetes mellitus, dyslipidemia, post-menopausal state and sedentary lifestyle. Past treatments include calcium channel blockers, diuretics, angiotensin blockers and beta blockers. The current treatment provides mild improvement. Compliance problems include exercise and diet.   Hyperlipidemia This is a chronic problem. The current episode started more than 1 year ago. Recent lipid tests were reviewed and are variable. Factors aggravating her hyperlipidemia include thiazides. Current antihyperlipidemic treatment includes statins. The current treatment provides moderate improvement of lipids. Compliance problems include adherence to diet and adherence to exercise.  Risk factors for coronary artery disease include dyslipidemia and hypertension.  Diabetes She presents for her follow-up diabetic visit. She has type 2 diabetes mellitus. No MedicAlert identification noted. Her disease course has been stable. There are no hypoglycemic associated symptoms. There are no hypoglycemic complications. Symptoms are stable. Risk factors for coronary artery disease include diabetes mellitus, dyslipidemia, hypertension and post-menopausal. Current diabetic treatment includes oral agent (monotherapy). She is compliant with treatment most of the time. Her weight is stable. She is following a diabetic diet. When asked about meal planning, she reported none. She has not had a previous visit with a dietitian. She never participates in exercise. (Does not check blood sugars at home) An ACE inhibitor/angiotensin  II receptor blocker is being taken. She does not see a podiatrist.Eye exam is current.      Review of Systems  Constitutional: Negative.   HENT: Negative.   Respiratory: Negative.   Cardiovascular: Negative.   Genitourinary: Negative.   Neurological: Negative.   Psychiatric/Behavioral: Negative.   All other systems reviewed and are negative.      Objective:   Physical Exam  Constitutional: She is oriented to person, place, and time. She appears well-developed and well-nourished.  HENT:  Nose: Nose normal.  Mouth/Throat: Oropharynx is clear and moist.  Eyes: EOM are normal.  Neck: Trachea normal, normal range of motion and full passive range of motion without pain. Neck supple. No JVD present. Carotid bruit is not present. No thyromegaly present.  Cardiovascular: Normal rate, regular rhythm, normal heart sounds and intact distal pulses.  Exam reveals no gallop and no friction rub.   No murmur heard. Pulmonary/Chest: Effort normal and breath sounds normal.  Abdominal: Soft. Bowel sounds are normal. She exhibits no distension and no mass. There is no tenderness.  Musculoskeletal: Normal range of motion.  Lymphadenopathy:    She has no cervical adenopathy.  Neurological: She is alert and oriented to person, place, and time. She has normal reflexes.  Skin: Skin is warm and dry.  Psychiatric: She has a normal mood and affect. Her behavior is normal. Judgment and thought content normal.    BP 152/92 mmHg  Pulse 80  Temp(Src) 96.8 F (36 C) (Oral)  Ht _0  (1.6 m)  Wt 152 lb (68.947 kg)  BMI 26.93 kg/m2   Results for orders placed or performed in visit on 05/03/14  POCT glycosylated hemoglobin (Hb A1C)  Result Value Ref Range   Hemoglobin A1C 6.5%           Assessment & Plan:  1.  Hyperlipidemia Low fat diet - NMR, lipoprofile - atorvastatin (LIPITOR) 40 MG tablet; Take 1 tablet (40 mg total) by mouth daily.  Dispense: 30 tablet; Refill: 3  2. Type 2 diabetes  mellitus with diabetic chronic kidney disease Continue to watch carbs - POCT glycosylated hemoglobin (Hb A1C) - metFORMIN (GLUCOPHAGE) 1000 MG tablet; Take 1 tablet (1,000 mg total) by mouth 2 (two) times daily with a meal.  Dispense: 60 tablet; Refill: 3  3. Essential hypertension Back on all blood pressure meds Keep diary of blood pressure Do  Not add salt to diet - CMP14+EGFR - nebivolol (BYSTOLIC) 10 MG tablet; Take 2 tablets (20 mg total) by mouth daily.  Dispense: 60 tablet; Refill: 3   Ordered dexa scan Labs pending Health maintenance reviewed Diet and exercise encouraged Continue all meds Follow up  In 3 month   [place], FNP

## 2014-05-04 LAB — NMR, LIPOPROFILE
Cholesterol: 239 mg/dL — ABNORMAL HIGH (ref 100–199)
HDL Cholesterol by NMR: 63 mg/dL (ref >39)
HDL Particle Number: 32.7 umol/L (ref >=30.5)
LDL PARTICLE NUMBER: 1637 nmol/L — AB (ref <1000)
LDL Size: 21.2 nm (ref >20.5)
LDL-C: 160 mg/dL — ABNORMAL HIGH (ref 0–99)
LP-IR Score: 40 (ref <=45)
Small LDL Particle Number: 465 nmol/L (ref <=527)
TRIGLYCERIDES BY NMR: 81 mg/dL (ref 0–149)

## 2014-05-04 LAB — CMP14+EGFR
A/G RATIO: 1.8 (ref 1.1–2.5)
ALT: 8 IU/L (ref 0–32)
AST: 11 IU/L (ref 0–40)
Albumin: 4.2 g/dL (ref 3.5–4.8)
Alkaline Phosphatase: 61 IU/L (ref 39–117)
BUN/Creatinine Ratio: 17 (ref 11–26)
BUN: 22 mg/dL (ref 8–27)
CO2: 19 mmol/L (ref 18–29)
Calcium: 9.7 mg/dL (ref 8.7–10.3)
Chloride: 110 mmol/L — ABNORMAL HIGH (ref 97–108)
Creatinine, Ser: 1.27 mg/dL — ABNORMAL HIGH (ref 0.57–1.00)
GFR calc non Af Amer: 40 mL/min/{1.73_m2} — ABNORMAL LOW (ref >59)
GFR, EST AFRICAN AMERICAN: 46 mL/min/{1.73_m2} — AB (ref >59)
GLOBULIN, TOTAL: 2.4 g/dL (ref 1.5–4.5)
Glucose: 154 mg/dL — ABNORMAL HIGH (ref 65–99)
POTASSIUM: 5.4 mmol/L — AB (ref 3.5–5.2)
SODIUM: 144 mmol/L (ref 134–144)
Total Bilirubin: <0.2 mg/dL (ref 0.0–1.2)
Total Protein: 6.6 g/dL (ref 6.0–8.5)

## 2014-05-13 DIAGNOSIS — H409 Unspecified glaucoma: Secondary | ICD-10-CM | POA: Diagnosis not present

## 2014-05-13 DIAGNOSIS — H4011X3 Primary open-angle glaucoma, severe stage: Secondary | ICD-10-CM | POA: Diagnosis not present

## 2014-05-27 ENCOUNTER — Other Ambulatory Visit (HOSPITAL_COMMUNITY): Payer: Self-pay | Admitting: Nephrology

## 2014-05-27 DIAGNOSIS — N183 Chronic kidney disease, stage 3 unspecified: Secondary | ICD-10-CM

## 2014-06-12 ENCOUNTER — Ambulatory Visit (HOSPITAL_COMMUNITY)
Admission: RE | Admit: 2014-06-12 | Discharge: 2014-06-12 | Disposition: A | Discharge status: Home / Self Care | Payer: Medicare Other | Source: Ambulatory Visit | Attending: Nephrology | Admitting: Nephrology

## 2014-06-12 DIAGNOSIS — N183 Chronic kidney disease, stage 3 unspecified: Secondary | ICD-10-CM

## 2014-06-12 DIAGNOSIS — N189 Chronic kidney disease, unspecified: Secondary | ICD-10-CM | POA: Diagnosis not present

## 2014-07-10 ENCOUNTER — Other Ambulatory Visit: Payer: Medicare Other

## 2014-07-10 ENCOUNTER — Ambulatory Visit: Payer: Medicare Other

## 2014-07-15 ENCOUNTER — Other Ambulatory Visit: Payer: Self-pay | Admitting: *Deleted

## 2014-07-15 DIAGNOSIS — E785 Hyperlipidemia, unspecified: Secondary | ICD-10-CM

## 2014-07-15 MED ORDER — ATORVASTATIN CALCIUM 40 MG PO TABS: 40.0000 mg | ORAL_TABLET | Freq: Every day | ORAL | Status: DC

## 2014-07-31 DIAGNOSIS — H35351 Cystoid macular degeneration, right eye: Secondary | ICD-10-CM | POA: Diagnosis not present

## 2014-07-31 DIAGNOSIS — H31401 Unspecified choroidal detachment, right eye: Secondary | ICD-10-CM | POA: Diagnosis not present

## 2014-08-05 ENCOUNTER — Encounter: Payer: Self-pay | Admitting: Nurse Practitioner

## 2014-08-05 ENCOUNTER — Ambulatory Visit (INDEPENDENT_AMBULATORY_CARE_PROVIDER_SITE_OTHER): Payer: Medicare Other | Admitting: Nurse Practitioner

## 2014-08-05 ENCOUNTER — Ambulatory Visit (INDEPENDENT_AMBULATORY_CARE_PROVIDER_SITE_OTHER): Payer: Medicare Other

## 2014-08-05 VITALS — BP 219/106 | HR 76 | Temp 97.3°F | Ht 63.0 in | Wt 150.0 lb

## 2014-08-05 DIAGNOSIS — E1122 Type 2 diabetes mellitus with diabetic chronic kidney disease: Secondary | ICD-10-CM | POA: Diagnosis not present

## 2014-08-05 DIAGNOSIS — Z780 Asymptomatic menopausal state: Secondary | ICD-10-CM | POA: Diagnosis not present

## 2014-08-05 DIAGNOSIS — N189 Chronic kidney disease, unspecified: Secondary | ICD-10-CM

## 2014-08-05 DIAGNOSIS — E785 Hyperlipidemia, unspecified: Secondary | ICD-10-CM

## 2014-08-05 DIAGNOSIS — Z23 Encounter for immunization: Secondary | ICD-10-CM | POA: Diagnosis not present

## 2014-08-05 DIAGNOSIS — I10 Essential (primary) hypertension: Secondary | ICD-10-CM

## 2014-08-05 DIAGNOSIS — M8580 Other specified disorders of bone density and structure, unspecified site: Secondary | ICD-10-CM | POA: Diagnosis not present

## 2014-08-05 LAB — POCT GLYCOSYLATED HEMOGLOBIN (HGB A1C): HEMOGLOBIN A1C: 6.4

## 2014-08-05 MED ORDER — HYDROCHLOROTHIAZIDE 25 MG PO TABS: 25.0000 mg | ORAL_TABLET | Freq: Every day | ORAL | Status: DC

## 2014-08-05 MED ORDER — AMLODIPINE BESYLATE 5 MG PO TABS: 5.0000 mg | ORAL_TABLET | Freq: Every day | ORAL | Status: DC

## 2014-08-05 MED ORDER — RALOXIFENE HCL 60 MG PO TABS: 60.0000 mg | ORAL_TABLET | Freq: Every day | ORAL | Status: DC

## 2014-08-05 MED ORDER — NEBIVOLOL HCL 10 MG PO TABS: 20.0000 mg | ORAL_TABLET | Freq: Every day | ORAL | Status: DC

## 2014-08-05 MED ORDER — ACETAZOLAMIDE ER 500 MG PO CP12: 500.0000 mg | ORAL_CAPSULE | Freq: Two times a day (BID) | ORAL | Status: DC

## 2014-08-05 MED ORDER — METFORMIN HCL 1000 MG PO TABS: 1000.0000 mg | ORAL_TABLET | Freq: Two times a day (BID) | ORAL | Status: DC

## 2014-08-05 MED ORDER — ATORVASTATIN CALCIUM 40 MG PO TABS: 40.0000 mg | ORAL_TABLET | Freq: Every day | ORAL | Status: DC

## 2014-08-05 NOTE — Progress Notes (Signed)
Subjective:    Patient ID: Jackie Hickman, female    DOB: 1932-09-11, 81 y.o.   MRN: [id number]   Patient here today for follow up of chronic medical problems. No complaints today. * still confused about meds taking- not taking 2 of her blood pressure meds.  Hypertension This is a chronic problem. The current episode started more than 1 year ago. The problem is unchanged. The problem is uncontrolled. Risk factors for coronary artery disease include diabetes mellitus, dyslipidemia, post-menopausal state and sedentary lifestyle. Past treatments include calcium channel blockers, diuretics, angiotensin blockers and beta blockers. The current treatment provides mild improvement. Compliance problems include exercise and diet.   Hyperlipidemia This is a chronic problem. The current episode started more than 1 year ago. Recent lipid tests were reviewed and are variable. Factors aggravating her hyperlipidemia include thiazides. Current antihyperlipidemic treatment includes statins. The current treatment provides moderate improvement of lipids. Compliance problems include adherence to diet and adherence to exercise.  Risk factors for coronary artery disease include dyslipidemia and hypertension.  Diabetes She presents for her follow-up diabetic visit. She has type 2 diabetes mellitus. No MedicAlert identification noted. Her disease course has been stable. There are no hypoglycemic associated symptoms. There are no hypoglycemic complications. Symptoms are stable. Risk factors for coronary artery disease include diabetes mellitus, dyslipidemia, hypertension and post-menopausal. Current diabetic treatment includes oral agent (monotherapy). She is compliant with treatment most of the time. Her weight is stable. She is following a diabetic diet. When asked about meal planning, she reported none. She has not had a previous visit with a dietitian. She never participates in exercise. (Does not check blood sugars at  home) An ACE inhibitor/angiotensin II receptor blocker is being taken. She does not see a podiatrist.Eye exam is current.      Review of Systems  Constitutional: Negative.   HENT: Negative.   Respiratory: Negative.   Cardiovascular: Negative.   Genitourinary: Negative.   Neurological: Negative.   Psychiatric/Behavioral: Negative.   All other systems reviewed and are negative.      Objective:   Physical Exam  Constitutional: She is oriented to person, place, and time. She appears well-developed and well-nourished.  HENT:  Nose: Nose normal.  Mouth/Throat: Oropharynx is clear and moist.  Eyes: EOM are normal.  Neck: Trachea normal, normal range of motion and full passive range of motion without pain. Neck supple. No JVD present. Carotid bruit is not present. No thyromegaly present.  Cardiovascular: Normal rate, regular rhythm, normal heart sounds and intact distal pulses.  Exam reveals no gallop and no friction rub.   No murmur heard. Pulmonary/Chest: Effort normal and breath sounds normal.  Abdominal: Soft. Bowel sounds are normal. She exhibits no distension and no mass. There is no tenderness.  Musculoskeletal: Normal range of motion.  Lymphadenopathy:    She has no cervical adenopathy.  Neurological: She is alert and oriented to person, place, and time. She has normal reflexes.  Skin: Skin is warm and dry.  Psychiatric: She has a normal mood and affect. Her behavior is normal. Judgment and thought content normal.     BP 219/106 mmHg  Pulse 76  Temp(Src) 97.3 F (36.3 C) (Oral)  Ht _0  (1.6 m)  Wt 150 lb (68.04 kg)  BMI 26.58 kg/m2  Results for orders placed or performed in visit on 08/05/14  POCT glycosylated hemoglobin (Hb A1C)  Result Value Ref Range   Hemoglobin A1C 6.4  Assessment & Plan:   1. Type 2 diabetes mellitus with diabetic chronic kidney disease Continue to watch carbs in diet - POCT glycosylated hemoglobin (Hb A1C) -  metFORMIN (GLUCOPHAGE) 1000 MG tablet; Take 1 tablet (1,000 mg total) by mouth 2 (two) times daily with a meal.  Dispense: 60 tablet; Refill: 3  2. Hyperlipidemia Ow fat diet - NMR, lipoprofile - atorvastatin (LIPITOR) 40 MG tablet; Take 1 tablet (40 mg total) by mouth daily.  Dispense: 90 tablet; Refill: 0  3. Essential hypertension Do  Not add slat to diet Reviewed meds again - CMP14+EGFR - nebivolol (BYSTOLIC) 10 MG tablet; Take 2 tablets (20 mg total) by mouth daily. (Patient not taking: Reported on 08/05/2014)  Dispense: 60 tablet; Refill: 3 - hydrochlorothiazide (HYDRODIURIL) 25 MG tablet; Take 1 tablet (25 mg total) by mouth daily. (Patient not taking: Reported on 08/05/2014)  Dispense: 30 tablet; Refill: 3 - amLODipine (NORVASC) 5 MG tablet; Take 1 tablet (5 mg total) by mouth daily. (Patient not taking: Reported on 08/05/2014)  Dispense: 90 tablet; Refill: 1  4. Osteopenia Weight bearing exercise - raloxifene (EVISTA) 60 MG tablet; Take 1 tablet (60 mg total) by mouth daily. (Patient not taking: Reported on 08/05/2014)  Dispense: 30 tablet; Refill: 5   Needs to put pills in daily pill boxes. Labs pending Health maintenance reviewed Diet and exercise encouraged Continue all meds Follow up  In 3 months   [place], FNP

## 2014-08-05 NOTE — Patient Instructions (Signed)
Osteoporosis Throughout your life, your body breaks down old bone and replaces it with new bone. As you get older, your body does not replace bone as quickly as it breaks it down. By the age of 30 years, most people begin to gradually lose bone because of the imbalance between bone loss and replacement. Some people lose more bone than others. Bone loss beyond a specified normal degree is considered osteoporosis.  Osteoporosis affects the strength and durability of your bones. The inside of the ends of your bones and your flat bones, like the bones of your pelvis, look like honeycomb, filled with tiny open spaces. As bone loss occurs, your bones become less dense. This means that the open spaces inside your bones become bigger and the walls between these spaces become thinner. This makes your bones weaker. Bones of a person with osteoporosis can become so weak that they can break (fracture) during minor accidents, such as a simple fall. CAUSES  The following factors have been associated with the development of osteoporosis:  Smoking.  Drinking more than 2 alcoholic drinks several days per week.  Long-term use of certain medicines:  Corticosteroids.  Chemotherapy medicines.  Thyroid medicines.  Antiepileptic medicines.  Gonadal hormone suppression medicine.  Immunosuppression medicine.  Being underweight.  Lack of physical activity.  Lack of exposure to the sun. This can lead to vitamin D deficiency.  Certain medical conditions:  Certain inflammatory bowel diseases, such as Crohn disease and ulcerative colitis.  Diabetes.  Hyperthyroidism.  Hyperparathyroidism. RISK FACTORS Anyone can develop osteoporosis. However, the following factors can increase your risk of developing osteoporosis:  Gender--Women are at higher risk than men.  Age--Being older than 50 years increases your risk.  Ethnicity--White and Asian people have an increased risk.  Weight --Being extremely  underweight can increase your risk of osteoporosis.  Family history of osteoporosis--Having a family member who has developed osteoporosis can increase your risk. SYMPTOMS  Usually, people with osteoporosis have no symptoms.  DIAGNOSIS  Signs during a physical exam that may prompt your caregiver to suspect osteoporosis include:  Decreased height. This is usually caused by the compression of the bones that form your spine (vertebrae) because they have weakened and become fractured.  A curving or rounding of the upper back (kyphosis). To confirm signs of osteoporosis, your caregiver may request a procedure that uses 2 low-dose X-ray beams with different levels of energy to measure your bone mineral density (dual-energy X-ray absorptiometry [DXA]). Also, your caregiver may check your level of vitamin D. TREATMENT  The goal of osteoporosis treatment is to strengthen bones in order to decrease the risk of bone fractures. There are different types of medicines available to help achieve this goal. Some of these medicines work by slowing the processes of bone loss. Some medicines work by increasing bone density. Treatment also involves making sure that your levels of calcium and vitamin D are adequate. PREVENTION  There are things you can do to help prevent osteoporosis. Adequate intake of calcium and vitamin D can help you achieve optimal bone mineral density. Regular exercise can also help, especially resistance and weight-bearing activities. If you smoke, quitting smoking is an important part of osteoporosis prevention. MAKE SURE YOU:  Understand these instructions.  Will watch your condition.  Will get help right away if you are not doing well or get worse. FOR MORE INFORMATION www.osteo.org and www.nof.org Document Released: 12/30/2004 Document Revised: 07/17/2012 Document Reviewed: 03/06/2011 [employer] Patient Information 2015 [employer]. This information is not   intended to replace advice  given to you by your health care provider. Make sure you discuss any questions you have with your health care provider.  

## 2014-08-05 NOTE — Addendum Note (Signed)
Addended by: Rolena Infante on: 08/05/2014 11:14 AM   Modules accepted: Orders

## 2014-08-06 LAB — CMP14+EGFR
ALT: 10 IU/L (ref 0–32)
AST: 19 IU/L (ref 0–40)
Albumin/Globulin Ratio: 1.5 (ref 1.1–2.5)
Albumin: 4.1 g/dL (ref 3.5–4.7)
Alkaline Phosphatase: 59 IU/L (ref 39–117)
BUN/Creatinine Ratio: 17 (ref 11–26)
BUN: 18 mg/dL (ref 8–27)
Bilirubin Total: 0.4 mg/dL (ref 0.0–1.2)
CALCIUM: 9.2 mg/dL (ref 8.7–10.3)
CO2: 17 mmol/L — AB (ref 18–29)
Chloride: 106 mmol/L (ref 97–108)
Creatinine, Ser: 1.07 mg/dL — ABNORMAL HIGH (ref 0.57–1.00)
GFR calc Af Amer: 57 mL/min/{1.73_m2} — ABNORMAL LOW (ref >59)
GFR, EST NON AFRICAN AMERICAN: 49 mL/min/{1.73_m2} — AB (ref >59)
GLOBULIN, TOTAL: 2.7 g/dL (ref 1.5–4.5)
GLUCOSE: 146 mg/dL — AB (ref 65–99)
POTASSIUM: 4.6 mmol/L (ref 3.5–5.2)
Sodium: 140 mmol/L (ref 134–144)
TOTAL PROTEIN: 6.8 g/dL (ref 6.0–8.5)

## 2014-08-06 LAB — NMR, LIPOPROFILE
Cholesterol: 174 mg/dL (ref 100–199)
HDL CHOLESTEROL BY NMR: 67 mg/dL (ref >39)
HDL Particle Number: 33.9 umol/L (ref >=30.5)
LDL PARTICLE NUMBER: 1063 nmol/L — AB (ref <1000)
LDL Size: 20.9 nm (ref >20.5)
LDL-C: 82 mg/dL (ref 0–99)
LP-IR SCORE: 40 (ref <=45)
Small LDL Particle Number: 405 nmol/L (ref <=527)
Triglycerides by NMR: 126 mg/dL (ref 0–149)

## 2014-08-20 DIAGNOSIS — H4011X3 Primary open-angle glaucoma, severe stage: Secondary | ICD-10-CM | POA: Diagnosis not present

## 2014-08-28 DIAGNOSIS — Z1231 Encounter for screening mammogram for malignant neoplasm of breast: Secondary | ICD-10-CM | POA: Diagnosis not present

## 2014-09-03 DIAGNOSIS — Z961 Presence of intraocular lens: Secondary | ICD-10-CM | POA: Diagnosis not present

## 2014-09-03 DIAGNOSIS — H4011X3 Primary open-angle glaucoma, severe stage: Secondary | ICD-10-CM | POA: Diagnosis not present

## 2014-09-17 ENCOUNTER — Encounter: Payer: Self-pay | Admitting: Nurse Practitioner

## 2014-09-25 DIAGNOSIS — H4011X3 Primary open-angle glaucoma, severe stage: Secondary | ICD-10-CM | POA: Diagnosis not present

## 2014-11-07 ENCOUNTER — Ambulatory Visit (INDEPENDENT_AMBULATORY_CARE_PROVIDER_SITE_OTHER): Payer: Medicare Other | Admitting: Nurse Practitioner

## 2014-11-07 ENCOUNTER — Encounter: Payer: Self-pay | Admitting: Nurse Practitioner

## 2014-11-07 ENCOUNTER — Ambulatory Visit: Payer: Self-pay | Admitting: Nurse Practitioner

## 2014-11-07 VITALS — BP 132/84 | HR 80 | Temp 98.2°F | Ht 63.0 in | Wt 150.8 lb

## 2014-11-07 DIAGNOSIS — M8580 Other specified disorders of bone density and structure, unspecified site: Secondary | ICD-10-CM

## 2014-11-07 DIAGNOSIS — E1122 Type 2 diabetes mellitus with diabetic chronic kidney disease: Secondary | ICD-10-CM

## 2014-11-07 DIAGNOSIS — Z6826 Body mass index (BMI) 26.0-26.9, adult: Secondary | ICD-10-CM | POA: Diagnosis not present

## 2014-11-07 DIAGNOSIS — I10 Essential (primary) hypertension: Secondary | ICD-10-CM | POA: Diagnosis not present

## 2014-11-07 DIAGNOSIS — N189 Chronic kidney disease, unspecified: Secondary | ICD-10-CM

## 2014-11-07 DIAGNOSIS — E785 Hyperlipidemia, unspecified: Secondary | ICD-10-CM

## 2014-11-07 LAB — POCT GLYCOSYLATED HEMOGLOBIN (HGB A1C): HEMOGLOBIN A1C: 6.2

## 2014-11-07 MED ORDER — HYDROCHLOROTHIAZIDE 25 MG PO TABS: 25.0000 mg | ORAL_TABLET | Freq: Every day | ORAL | Status: DC

## 2014-11-07 MED ORDER — ACETAZOLAMIDE ER 500 MG PO CP12
500.0000 mg | ORAL_CAPSULE | Freq: Two times a day (BID) | ORAL | Status: DC
Start: 2014-11-07 — End: 2016-02-23

## 2014-11-07 MED ORDER — NEBIVOLOL HCL 10 MG PO TABS: 20.0000 mg | ORAL_TABLET | Freq: Every day | ORAL | Status: DC

## 2014-11-07 MED ORDER — RALOXIFENE HCL 60 MG PO TABS: 60.0000 mg | ORAL_TABLET | Freq: Every day | ORAL | Status: DC

## 2014-11-07 MED ORDER — AMLODIPINE BESYLATE 5 MG PO TABS: 5.0000 mg | ORAL_TABLET | Freq: Every day | ORAL | Status: DC

## 2014-11-07 MED ORDER — ATORVASTATIN CALCIUM 40 MG PO TABS: 40.0000 mg | ORAL_TABLET | Freq: Every day | ORAL | Status: DC

## 2014-11-07 MED ORDER — LOSARTAN POTASSIUM 100 MG PO TABS: 100.0000 mg | ORAL_TABLET | Freq: Every day | ORAL | Status: DC

## 2014-11-07 MED ORDER — METFORMIN HCL 1000 MG PO TABS: 1000.0000 mg | ORAL_TABLET | Freq: Two times a day (BID) | ORAL | Status: DC

## 2014-11-07 NOTE — Patient Instructions (Signed)

## 2014-11-07 NOTE — Progress Notes (Signed)
Subjective:    Patient ID: Jackie Hickman, female    DOB: August 02, 1932, 81 y.o.   MRN: [id number]   Patient here today for follow up of chronic medical problems. No complaints today.   Hypertension This is a chronic problem. The current episode started more than 1 year ago. The problem is unchanged. The problem is uncontrolled. Risk factors for coronary artery disease include diabetes mellitus, dyslipidemia, post-menopausal state and sedentary lifestyle. Past treatments include calcium channel blockers, diuretics, angiotensin blockers and beta blockers. The current treatment provides mild improvement. Compliance problems include exercise and diet.   Hyperlipidemia This is a chronic problem. The current episode started more than 1 year ago. Recent lipid tests were reviewed and are variable. Factors aggravating her hyperlipidemia include thiazides. Current antihyperlipidemic treatment includes statins. The current treatment provides moderate improvement of lipids. Compliance problems include adherence to diet and adherence to exercise.  Risk factors for coronary artery disease include dyslipidemia and hypertension.  Diabetes She presents for her follow-up diabetic visit. She has type 2 diabetes mellitus. No MedicAlert identification noted. Her disease course has been stable. There are no hypoglycemic associated symptoms. There are no hypoglycemic complications. Symptoms are stable. Risk factors for coronary artery disease include diabetes mellitus, dyslipidemia, hypertension and post-menopausal. Current diabetic treatment includes oral agent (monotherapy). She is compliant with treatment most of the time. Her weight is stable. She is following a diabetic diet. When asked about meal planning, she reported none. She has not had a previous visit with a dietitian. She never participates in exercise. (Does not check blood sugars at home) An ACE inhibitor/angiotensin II receptor blocker is being taken. She does  not see a podiatrist.Eye exam is current.      Review of Systems  Constitutional: Negative.   HENT: Negative.   Respiratory: Negative.   Cardiovascular: Negative.   Genitourinary: Negative.   Neurological: Negative.   Psychiatric/Behavioral: Negative.   All other systems reviewed and are negative.      Objective:   Physical Exam  Constitutional: She is oriented to person, place, and time. She appears well-developed and well-nourished.  HENT:  Nose: Nose normal.  Mouth/Throat: Oropharynx is clear and moist.  Eyes: EOM are normal.  Neck: Trachea normal, normal range of motion and full passive range of motion without pain. Neck supple. No JVD present. Carotid bruit is not present. No thyromegaly present.  Cardiovascular: Normal rate, regular rhythm, normal heart sounds and intact distal pulses.  Exam reveals no gallop and no friction rub.   No murmur heard. Pulmonary/Chest: Effort normal and breath sounds normal.  Abdominal: Soft. Bowel sounds are normal. She exhibits no distension and no mass. There is no tenderness.  Musculoskeletal: Normal range of motion.  Lymphadenopathy:    She has no cervical adenopathy.  Neurological: She is alert and oriented to person, place, and time. She has normal reflexes.  Skin: Skin is warm and dry.  Psychiatric: She has a normal mood and affect. Her behavior is normal. Judgment and thought content normal.     BP 132/84 mmHg  Pulse 80  Temp(Src) 98.2 F (36.8 C) (Oral)  Ht 5' 3"  (1.6 m)  Wt 150 lb 12.8 oz (68.402 kg)  BMI 26.72 kg/m2   Results for orders placed or performed in visit on 11/07/14  POCT glycosylated hemoglobin (Hb A1C)  Result Value Ref Range   Hemoglobin A1C 6.2          Assessment & Plan:   1. Type 2 diabetes mellitus  with diabetic chronic kidney disease Continue to watch carbs in diet - POCT glycosylated hemoglobin (Hb A1C) - metFORMIN (GLUCOPHAGE) 1000 MG tablet; Take 1 tablet (1,000 mg total) by mouth 2  (two) times daily with a meal.  Dispense: 180 tablet; Refill: 1  2. Hyperlipidemia Low fat diet - Lipid panel - atorvastatin (LIPITOR) 40 MG tablet; Take 1 tablet (40 mg total) by mouth daily.  Dispense: 90 tablet; Refill: 0  3. Essential hypertension Do not add salt to diet - CMP14+EGFR - losartan (COZAAR) 100 MG tablet; Take 1 tablet (100 mg total) by mouth daily.  Dispense: 90 tablet; Refill: 1 - nebivolol (BYSTOLIC) 10 MG tablet; Take 2 tablets (20 mg total) by mouth daily.  Dispense: 90 tablet; Refill: 1 - amLODipine (NORVASC) 5 MG tablet; Take 1 tablet (5 mg total) by mouth daily.  Dispense: 90 tablet; Refill: 1 - hydrochlorothiazide (HYDRODIURIL) 25 MG tablet; Take 1 tablet (25 mg total) by mouth daily.  Dispense: 90 tablet; Refill: 1  4. BMI 26.0-26.9,adult Discussed diet and exercise for person with BMI >25 Will recheck weight in 3-6 months   5. Osteopenia Weight bearing exericses - raloxifene (EVISTA) 60 MG tablet; Take 1 tablet (60 mg total) by mouth daily.  Dispense: 90 tablet; Refill: 1    Labs pending Health maintenance reviewed Diet and exercise encouraged Continue all meds Follow up  In 3 month   [place], FNP

## 2014-11-08 ENCOUNTER — Other Ambulatory Visit: Payer: Self-pay | Admitting: Nurse Practitioner

## 2014-11-08 LAB — CMP14+EGFR
ALK PHOS: 51 IU/L (ref 39–117)
ALT: 12 IU/L (ref 0–32)
AST: 16 IU/L (ref 0–40)
Albumin/Globulin Ratio: 1.5 (ref 1.1–2.5)
Albumin: 3.8 g/dL (ref 3.5–4.7)
BUN/Creatinine Ratio: 14 (ref 11–26)
BUN: 17 mg/dL (ref 8–27)
Bilirubin Total: 0.3 mg/dL (ref 0.0–1.2)
CO2: 22 mmol/L (ref 18–29)
CREATININE: 1.25 mg/dL — AB (ref 0.57–1.00)
Calcium: 8.5 mg/dL — ABNORMAL LOW (ref 8.7–10.3)
Chloride: 102 mmol/L (ref 97–108)
GFR calc Af Amer: 47 mL/min/{1.73_m2} — ABNORMAL LOW (ref >59)
GFR calc non Af Amer: 41 mL/min/{1.73_m2} — ABNORMAL LOW (ref >59)
Globulin, Total: 2.6 g/dL (ref 1.5–4.5)
Glucose: 166 mg/dL — ABNORMAL HIGH (ref 65–99)
Potassium: 3.4 mmol/L — ABNORMAL LOW (ref 3.5–5.2)
Sodium: 142 mmol/L (ref 134–144)
TOTAL PROTEIN: 6.4 g/dL (ref 6.0–8.5)

## 2014-11-08 LAB — LIPID PANEL
Chol/HDL Ratio: 3.1 ratio units (ref 0.0–4.4)
Cholesterol, Total: 155 mg/dL (ref 100–199)
HDL: 50 mg/dL (ref >39)
LDL CALC: 48 mg/dL (ref 0–99)
TRIGLYCERIDES: 285 mg/dL — AB (ref 0–149)
VLDL CHOLESTEROL CAL: 57 mg/dL — AB (ref 5–40)

## 2014-11-08 MED ORDER — FENOFIBRATE 145 MG PO TABS: 145.0000 mg | ORAL_TABLET | Freq: Every day | ORAL | Status: DC

## 2014-12-27 DIAGNOSIS — H4011X3 Primary open-angle glaucoma, severe stage: Secondary | ICD-10-CM | POA: Diagnosis not present

## 2014-12-27 DIAGNOSIS — H1851 Endothelial corneal dystrophy: Secondary | ICD-10-CM | POA: Diagnosis not present

## 2015-01-15 DIAGNOSIS — Z961 Presence of intraocular lens: Secondary | ICD-10-CM | POA: Diagnosis not present

## 2015-01-15 DIAGNOSIS — H401133 Primary open-angle glaucoma, bilateral, severe stage: Secondary | ICD-10-CM | POA: Diagnosis not present

## 2015-01-15 DIAGNOSIS — H1851 Endothelial corneal dystrophy: Secondary | ICD-10-CM | POA: Diagnosis not present

## 2015-01-23 ENCOUNTER — Encounter: Payer: Self-pay | Admitting: Gastroenterology

## 2015-02-07 ENCOUNTER — Ambulatory Visit (INDEPENDENT_AMBULATORY_CARE_PROVIDER_SITE_OTHER): Payer: Medicare Other | Admitting: Nurse Practitioner

## 2015-02-07 ENCOUNTER — Encounter: Payer: Self-pay | Admitting: Nurse Practitioner

## 2015-02-07 VITALS — BP 127/73 | HR 71 | Temp 96.8°F | Ht 63.0 in | Wt 146.0 lb

## 2015-02-07 DIAGNOSIS — I10 Essential (primary) hypertension: Secondary | ICD-10-CM

## 2015-02-07 DIAGNOSIS — E785 Hyperlipidemia, unspecified: Secondary | ICD-10-CM | POA: Diagnosis not present

## 2015-02-07 DIAGNOSIS — Z6826 Body mass index (BMI) 26.0-26.9, adult: Secondary | ICD-10-CM

## 2015-02-07 DIAGNOSIS — E1122 Type 2 diabetes mellitus with diabetic chronic kidney disease: Secondary | ICD-10-CM | POA: Diagnosis not present

## 2015-02-07 LAB — POCT GLYCOSYLATED HEMOGLOBIN (HGB A1C): Hemoglobin A1C: 6.5

## 2015-02-07 NOTE — Patient Instructions (Signed)
Bone Health Bones protect organs, store calcium, and anchor muscles. Good health habits, such as eating nutritious foods and exercising regularly, are important for maintaining healthy bones. They can also help to prevent a condition that causes bones to lose density and become weak and brittle (osteoporosis). WHY IS BONE MASS IMPORTANT? Bone mass refers to the amount of bone tissue that you have. The higher your bone mass, the stronger your bones. An important step toward having healthy bones throughout life is to have strong and dense bones during childhood. A young adult who has a high bone mass is more likely to have a high bone mass later in life. Bone mass at its greatest it is called peak bone mass. A large decline in bone mass occurs in older adults. In women, it occurs about the time of menopause. During this time, it is important to practice good health habits, because if more bone is lost than what is replaced, the bones will become less healthy and more likely to break (fracture). If you find that you have a low bone mass, you may be able to prevent osteoporosis or further bone loss by changing your diet and lifestyle. HOW CAN I FIND OUT IF MY BONE MASS IS LOW? Bone mass can be measured with an X-ray test that is called a bone mineral density (BMD) test. This test is recommended for all women who are age 65 or older. It may also be recommended for men who are age 70 or older, or for people who are more likely to develop osteoporosis due to:  Having bones that break easily.  Having a long-term disease that weakens bones, such as kidney disease or rheumatoid arthritis.  Having menopause earlier than normal.  Taking medicine that weakens bones, such as steroids, thyroid hormones, or hormone treatment for breast cancer or prostate cancer.  Smoking.  Drinking three or more alcoholic drinks each day. WHAT ARE THE NUTRITIONAL RECOMMENDATIONS FOR HEALTHY BONES? To have healthy bones, you need  to get enough of the right minerals and vitamins. Most nutrition experts recommend getting these nutrients from the foods that you eat. Nutritional recommendations vary from person to person. Ask your health care provider what is healthy for you. Here are some general guidelines. Calcium Recommendations Calcium is the most important (essential) mineral for bone health. Most people can get enough calcium from their diet, but supplements may be recommended for people who are at risk for osteoporosis. Good sources of calcium include:  Dairy products, such as low-fat or nonfat milk, cheese, and yogurt.  Dark green leafy vegetables, such as bok choy and broccoli.  Calcium-fortified foods, such as orange juice, cereal, bread, soy beverages, and tofu products.  Nuts, such as almonds. Follow these recommended amounts for daily calcium intake:  Children, age 1-3: 700 mg.  Children, age 4-8: 1,000 mg.  Children, age 9-13: 1,300 mg.  Teens, age 14-18: 1,300 mg.  Adults, age 19-50: 1,000 mg.  Adults, age 51-70:  Men: 1,000 mg.  Women: 1,200 mg.  Adults, age 71 or older: 1,200 mg.  Pregnant and breastfeeding females:  Teens: 1,300 mg.  Adults: 1,000 mg. Vitamin D Recommendations Vitamin D is the most essential vitamin for bone health. It helps the body to absorb calcium. Sunlight stimulates the skin to make vitamin D, so be sure to get enough sunlight. If you live in a cold climate or you do not get outside often, your health care provider may recommend that you take vitamin D supplements. Good   sources of vitamin D in your diet include:  Egg yolks.  Saltwater fish.  Milk and cereal fortified with vitamin D. Follow these recommended amounts for daily vitamin D intake:  Children and teens, age 1-18: 600 international units.  Adults, age 50 or younger: 400-800 international units.  Adults, age 51 or older: 800-1,000 international units. Other Nutrients Other nutrients for bone  health include:  Phosphorus. This mineral is found in meat, poultry, dairy foods, nuts, and legumes. The recommended daily intake for adult men and adult women is 700 mg.  Magnesium. This mineral is found in seeds, nuts, dark green vegetables, and legumes. The recommended daily intake for adult men is 400-420 mg. For adult women, it is 310-320 mg.  Vitamin K. This vitamin is found in green leafy vegetables. The recommended daily intake is 120 mg for adult men and 90 mg for adult women. WHAT TYPE OF PHYSICAL ACTIVITY IS BEST FOR BUILDING AND MAINTAINING HEALTHY BONES? Weight-bearing and strength-building activities are important for building and maintaining peak bone mass. Weight-bearing activities cause muscles and bones to work against gravity. Strength-building activities increases muscle strength that supports bones. Weight-bearing and muscle-building activities include:  Walking and hiking.  Jogging and running.  Dancing.  Gym exercises.  Lifting weights.  Tennis and racquetball.  Climbing stairs.  Aerobics. Adults should get at least 30 minutes of moderate physical activity on most days. Children should get at least 60 minutes of moderate physical activity on most days. Ask your health care provide what type of exercise is best for you. WHERE CAN I FIND MORE INFORMATION? For more information, check out the following websites:  National Osteoporosis Foundation: http://nof.org/learn/basics  National Institutes of Health: http://www.niams.nih.gov/Health_Info/Bone/Bone_Health/bone_health_for_life.asp   This information is not intended to replace advice given to you by your health care provider. Make sure you discuss any questions you have with your health care provider.   Document Released: 06/12/2003 Document Revised: 08/06/2014 Document Reviewed: 03/27/2014 Elsevier Interactive Patient Education 2016 [employer] Inc.  

## 2015-02-07 NOTE — Progress Notes (Signed)
Subjective:    Patient ID: Jackie Hickman, female    DOB: 07-12-1932, 81 y.o.   MRN: [id number]   Patient here today for follow up of chronic medical problems. No complaints today.   Hypertension This is a chronic problem. The current episode started more than 1 year ago. The problem is unchanged. The problem is uncontrolled. Risk factors for coronary artery disease include diabetes mellitus, dyslipidemia, post-menopausal state and sedentary lifestyle. Past treatments include calcium channel blockers, diuretics, angiotensin blockers and beta blockers. The current treatment provides mild improvement. Compliance problems include exercise and diet.   Hyperlipidemia This is a chronic problem. The current episode started more than 1 year ago. Recent lipid tests were reviewed and are variable. Factors aggravating her hyperlipidemia include thiazides. Current antihyperlipidemic treatment includes statins. The current treatment provides moderate improvement of lipids. Compliance problems include adherence to diet and adherence to exercise.  Risk factors for coronary artery disease include dyslipidemia and hypertension.  Diabetes She presents for her follow-up diabetic visit. She has type 2 diabetes mellitus. No MedicAlert identification noted. Her disease course has been stable. There are no hypoglycemic associated symptoms. There are no hypoglycemic complications. Symptoms are stable. Risk factors for coronary artery disease include diabetes mellitus, dyslipidemia, hypertension and post-menopausal. Current diabetic treatment includes oral agent (monotherapy). She is compliant with treatment most of the time. Her weight is stable. She is following a diabetic diet. When asked about meal planning, she reported none. She has not had a previous visit with a dietitian. She never participates in exercise. (Does not check blood sugars at home) An ACE inhibitor/angiotensin II receptor blocker is being taken. She does  not see a podiatrist.Eye exam is current.      Review of Systems  Constitutional: Negative.   HENT: Negative.   Respiratory: Negative.   Cardiovascular: Negative.   Genitourinary: Negative.   Neurological: Negative.   Psychiatric/Behavioral: Negative.   All other systems reviewed and are negative.      Objective:   Physical Exam  Constitutional: She is oriented to person, place, and time. She appears well-developed and well-nourished.  HENT:  Nose: Nose normal.  Mouth/Throat: Oropharynx is clear and moist.  Eyes: EOM are normal.  Neck: Trachea normal, normal range of motion and full passive range of motion without pain. Neck supple. No JVD present. Carotid bruit is not present. No thyromegaly present.  Cardiovascular: Normal rate, regular rhythm, normal heart sounds and intact distal pulses.  Exam reveals no gallop and no friction rub.   No murmur heard. Pulmonary/Chest: Effort normal and breath sounds normal.  Abdominal: Soft. Bowel sounds are normal. She exhibits no distension and no mass. There is no tenderness.  Musculoskeletal: Normal range of motion.  Lymphadenopathy:    She has no cervical adenopathy.  Neurological: She is alert and oriented to person, place, and time. She has normal reflexes.  Skin: Skin is warm and dry.  Psychiatric: She has a normal mood and affect. Her behavior is normal. Judgment and thought content normal.     BP 127/73 mmHg  Pulse 71  Temp(Src) 96.8 F (36 C) (Oral)  Ht 5' 3"  (1.6 m)  Wt 146 lb (66.225 kg)  BMI 25.87 kg/m2   Results for orders placed or performed in visit on 02/07/15  POCT glycosylated hemoglobin (Hb A1C)  Result Value Ref Range   Hemoglobin A1C 6.5          Assessment & Plan:   1. Type 2 diabetes mellitus with chronic  kidney disease, without long-term current use of insulin, unspecified CKD stage ([place]) Continue to watch carbs in diet - POCT glycosylated hemoglobin (Hb A1C) - Microalbumin, urine  2.  Hyperlipidemia Low fta diet - Lipid panel  3. Essential hypertension Do not add salt to diet - CMP14+EGFR  4. BMI 26.0-26.9,adult Discussed diet and exercise for person with BMI >25 Will recheck weight in 3-6 months    Refuses flu vaccine toay Labs pending Health maintenance reviewed Diet and exercise encouraged Continue all meds Follow up  In 3 months   [place], FNP

## 2015-02-08 LAB — LIPID PANEL
Chol/HDL Ratio: 4.5 ratio units — ABNORMAL HIGH (ref 0.0–4.4)
Cholesterol, Total: 218 mg/dL — ABNORMAL HIGH (ref 100–199)
HDL: 48 mg/dL (ref >39)
LDL CALC: 123 mg/dL — AB (ref 0–99)
TRIGLYCERIDES: 236 mg/dL — AB (ref 0–149)
VLDL Cholesterol Cal: 47 mg/dL — ABNORMAL HIGH (ref 5–40)

## 2015-02-08 LAB — CMP14+EGFR
A/G RATIO: 1.4 (ref 1.1–2.5)
ALBUMIN: 3.9 g/dL (ref 3.5–4.7)
ALT: 9 IU/L (ref 0–32)
AST: 14 IU/L (ref 0–40)
Alkaline Phosphatase: 56 IU/L (ref 39–117)
BUN/Creatinine Ratio: 15 (ref 11–26)
BUN: 21 mg/dL (ref 8–27)
Bilirubin Total: 0.3 mg/dL (ref 0.0–1.2)
CALCIUM: 8 mg/dL — AB (ref 8.7–10.3)
CO2: 22 mmol/L (ref 18–29)
CREATININE: 1.36 mg/dL — AB (ref 0.57–1.00)
Chloride: 103 mmol/L (ref 97–106)
GFR calc non Af Amer: 37 mL/min/{1.73_m2} — ABNORMAL LOW (ref >59)
GFR, EST AFRICAN AMERICAN: 42 mL/min/{1.73_m2} — AB (ref >59)
GLOBULIN, TOTAL: 2.8 g/dL (ref 1.5–4.5)
Glucose: 160 mg/dL — ABNORMAL HIGH (ref 65–99)
Potassium: 3.4 mmol/L — ABNORMAL LOW (ref 3.5–5.2)
Sodium: 144 mmol/L (ref 136–144)
TOTAL PROTEIN: 6.7 g/dL (ref 6.0–8.5)

## 2015-02-08 LAB — MICROALBUMIN, URINE: MICROALBUM., U, RANDOM: 59 ug/mL

## 2015-02-13 ENCOUNTER — Encounter: Payer: Self-pay | Admitting: *Deleted

## 2015-02-20 DIAGNOSIS — H401113 Primary open-angle glaucoma, right eye, severe stage: Secondary | ICD-10-CM | POA: Diagnosis not present

## 2015-02-20 DIAGNOSIS — H401133 Primary open-angle glaucoma, bilateral, severe stage: Secondary | ICD-10-CM | POA: Diagnosis not present

## 2015-03-20 ENCOUNTER — Encounter (HOSPITAL_COMMUNITY): Payer: Self-pay | Admitting: Emergency Medicine

## 2015-03-20 ENCOUNTER — Emergency Department (HOSPITAL_COMMUNITY): Payer: Medicare Other

## 2015-03-20 ENCOUNTER — Emergency Department (HOSPITAL_COMMUNITY)
Admission: EM | Admit: 2015-03-20 | Discharge: 2015-03-20 | Disposition: A | Discharge status: Home / Self Care | Payer: Medicare Other | Attending: Emergency Medicine | Admitting: Emergency Medicine

## 2015-03-20 DIAGNOSIS — H409 Unspecified glaucoma: Secondary | ICD-10-CM | POA: Diagnosis not present

## 2015-03-20 DIAGNOSIS — Z79899 Other long term (current) drug therapy: Secondary | ICD-10-CM | POA: Insufficient documentation

## 2015-03-20 DIAGNOSIS — M1000 Idiopathic gout, unspecified site: Secondary | ICD-10-CM

## 2015-03-20 DIAGNOSIS — I10 Essential (primary) hypertension: Secondary | ICD-10-CM | POA: Insufficient documentation

## 2015-03-20 DIAGNOSIS — Z7984 Long term (current) use of oral hypoglycemic drugs: Secondary | ICD-10-CM | POA: Insufficient documentation

## 2015-03-20 DIAGNOSIS — M79672 Pain in left foot: Secondary | ICD-10-CM | POA: Diagnosis not present

## 2015-03-20 DIAGNOSIS — E119 Type 2 diabetes mellitus without complications: Secondary | ICD-10-CM | POA: Insufficient documentation

## 2015-03-20 DIAGNOSIS — Z791 Long term (current) use of non-steroidal anti-inflammatories (NSAID): Secondary | ICD-10-CM | POA: Insufficient documentation

## 2015-03-20 DIAGNOSIS — M10072 Idiopathic gout, left ankle and foot: Secondary | ICD-10-CM | POA: Diagnosis not present

## 2015-03-20 DIAGNOSIS — E785 Hyperlipidemia, unspecified: Secondary | ICD-10-CM | POA: Insufficient documentation

## 2015-03-20 DIAGNOSIS — Z7982 Long term (current) use of aspirin: Secondary | ICD-10-CM | POA: Insufficient documentation

## 2015-03-20 HISTORY — DX: Gout, unspecified: M10.9

## 2015-03-20 LAB — CBC WITH DIFFERENTIAL/PLATELET
BASOS PCT: 0 %
Basophils Absolute: 0 10*3/uL (ref 0.0–0.1)
EOS ABS: 0 10*3/uL (ref 0.0–0.7)
Eosinophils Relative: 0 %
HEMATOCRIT: 27 % — AB (ref 36.0–46.0)
Hemoglobin: 8.8 g/dL — ABNORMAL LOW (ref 12.0–15.0)
Lymphocytes Relative: 13 %
Lymphs Abs: 1.1 10*3/uL (ref 0.7–4.0)
MCH: 32.4 pg (ref 26.0–34.0)
MCHC: 32.6 g/dL (ref 30.0–36.0)
MCV: 99.3 fL (ref 78.0–100.0)
MONO ABS: 0.9 10*3/uL (ref 0.1–1.0)
MONOS PCT: 11 %
Neutro Abs: 6 10*3/uL (ref 1.7–7.7)
Neutrophils Relative %: 76 %
Platelets: 237 10*3/uL (ref 150–400)
RBC: 2.72 MIL/uL — ABNORMAL LOW (ref 3.87–5.11)
RDW: 13.7 % (ref 11.5–15.5)
WBC: 7.9 10*3/uL (ref 4.0–10.5)

## 2015-03-20 LAB — COMPREHENSIVE METABOLIC PANEL
ALBUMIN: 2.7 g/dL — AB (ref 3.5–5.0)
ALT: 8 U/L — ABNORMAL LOW (ref 14–54)
ANION GAP: 11 (ref 5–15)
AST: 13 U/L — ABNORMAL LOW (ref 15–41)
Alkaline Phosphatase: 61 U/L (ref 38–126)
BILIRUBIN TOTAL: 0.7 mg/dL (ref 0.3–1.2)
BUN: 20 mg/dL (ref 6–20)
CO2: 29 mmol/L (ref 22–32)
Calcium: 7.9 mg/dL — ABNORMAL LOW (ref 8.9–10.3)
Chloride: 103 mmol/L (ref 101–111)
Creatinine, Ser: 1.15 mg/dL — ABNORMAL HIGH (ref 0.44–1.00)
GFR calc non Af Amer: 44 mL/min — ABNORMAL LOW (ref >60)
GFR, EST AFRICAN AMERICAN: 51 mL/min — AB (ref >60)
GLUCOSE: 138 mg/dL — AB (ref 65–99)
POTASSIUM: 2.6 mmol/L — AB (ref 3.5–5.1)
Sodium: 143 mmol/L (ref 135–145)
TOTAL PROTEIN: 6.5 g/dL (ref 6.5–8.1)

## 2015-03-20 LAB — URIC ACID: URIC ACID, SERUM: 9.3 mg/dL — AB (ref 2.3–6.6)

## 2015-03-20 MED ORDER — POTASSIUM CHLORIDE CRYS ER 20 MEQ PO TBCR
40.0000 meq | EXTENDED_RELEASE_TABLET | Freq: Once | ORAL | Status: AC
  Administered 2015-03-20: 40 meq via ORAL
  Filled 2015-03-20: qty 2

## 2015-03-20 MED ORDER — NAPROXEN 500 MG PO TABS: 500.0000 mg | ORAL_TABLET | Freq: Two times a day (BID) | ORAL | Status: DC

## 2015-03-20 MED ORDER — POTASSIUM CHLORIDE CRYS ER 20 MEQ PO TBCR: 20.0000 meq | EXTENDED_RELEASE_TABLET | Freq: Every day | ORAL | Status: DC

## 2015-03-20 MED ORDER — HYDROCODONE-ACETAMINOPHEN 5-325 MG PO TABS
1.0000 | ORAL_TABLET | Freq: Four times a day (QID) | ORAL | Status: DC | PRN
Start: 2015-03-20 — End: 2016-02-15

## 2015-03-20 NOTE — ED Provider Notes (Signed)
CSN: [id number]     Arrival date & time 03/20/15  1145 History  By signing my name below, I, Tula Nakayama, attest that this documentation has been prepared under the direction and in the presence of Milton Ferguson, MD.  Electronically Signed: Tula Nakayama, ED Scribe. 03/20/2015. 12:08 PM.   Chief Complaint  Patient presents with  . Leg Pain   Patient is a 81 y.o. female presenting with lower extremity pain. The history is provided by the patient (Patient complains of left foot pain for a few days. She has had a history of gout before).  Foot Pain This is a recurrent problem. The current episode started 2 days ago. The problem occurs constantly. The problem has not changed since onset.Pertinent negatives include no chest pain, no abdominal pain and no headaches. Exacerbated by: Movement. Nothing relieves the symptoms.    HPI Comments: Jackie Hickman is a 81 y.o. female who presents to the Emergency Department complaining of left foot pain   Past Medical History  Diagnosis Date  . Glaucoma   . Diabetes mellitus   . Hyperlipidemia   . Hypertension   . Gout    Past Surgical History  Procedure Laterality Date  . Appendectomy  1948  . Laparoscopic cholecystectomy  1980  . Eye surgery      bleeding and glaucoma   Family History  Problem Relation Age of Onset  . Colon cancer Neg Hx   . Stomach cancer Neg Hx    Social History  Substance Use Topics  . Smoking status: Never Smoker   . Smokeless tobacco: Never Used  . Alcohol Use: Yes     Comment: occasional drink   OB History    No data available     Review of Systems  Constitutional: Negative for appetite change and fatigue.  HENT: Negative for congestion, ear discharge and sinus pressure.   Eyes: Negative for discharge.  Respiratory: Negative for cough.   Cardiovascular: Negative for chest pain.  Gastrointestinal: Negative for abdominal pain and diarrhea.  Genitourinary: Negative for frequency and hematuria.   Musculoskeletal: Negative for back pain.       Pain in left foot  Skin: Negative for rash.  Neurological: Negative for seizures and headaches.  Psychiatric/Behavioral: Negative for hallucinations.      Allergies  Review of patient's allergies indicates no known allergies.  Home Medications   Prior to Admission medications   Medication Sig Start Date End Date Taking? Authorizing Provider  acetaZOLAMIDE (DIAMOX) 500 MG capsule Take 1 capsule (500 mg total) by mouth 2 (two) times daily. 11/07/14   [name] Hassell Done, FNP  amLODipine (NORVASC) 5 MG tablet Take 1 tablet (5 mg total) by mouth daily. 11/07/14   [name] Hassell Done, FNP  aspirin 81 MG tablet Take 81 mg by mouth daily.    Historical Provider, MD  atorvastatin (LIPITOR) 40 MG tablet Take 1 tablet (40 mg total) by mouth daily. 11/07/14   [name] Hassell Done, FNP  brimonidine-timolol (COMBIGAN) 0.2-0.5 % ophthalmic solution Place 1 drop into both eyes every 12 (twelve) hours.    Historical Provider, MD  fenofibrate (TRICOR) 145 MG tablet Take 1 tablet (145 mg total) by mouth daily. 11/08/14   [name] Hassell Done, FNP  hydrochlorothiazide (HYDRODIURIL) 25 MG tablet Take 1 tablet (25 mg total) by mouth daily. 11/07/14   [name] Hassell Done, FNP  latanoprost (XALATAN) 0.005 % ophthalmic solution Place 1 drop into both eyes at bedtime.    Historical Provider, MD  losartan (COZAAR) 100 MG tablet Take  1 tablet (100 mg total) by mouth daily. 11/07/14   [name] Hassell Done, FNP  meloxicam (MOBIC) 15 MG tablet Take 1 tablet (15 mg total) by mouth daily. 11/06/13   Lysbeth Penner, FNP  metFORMIN (GLUCOPHAGE) 1000 MG tablet Take 1 tablet (1,000 mg total) by mouth 2 (two) times daily with a meal. 11/07/14   [name] Hassell Done, FNP  nebivolol (BYSTOLIC) 10 MG tablet Take 2 tablets (20 mg total) by mouth daily. 11/07/14   [name] Hassell Done, FNP  raloxifene (EVISTA) 60 MG tablet Take 1 tablet (60 mg total) by mouth daily. 11/07/14   [name]  Hassell Done, FNP   BP 176/98 mmHg  Pulse 91  Temp(Src) 98.3 F (36.8 C) (Oral)  Resp 16  Ht 5\' 3"  (1.6 m)  Wt 146 lb (66.225 kg)  BMI 25.87 kg/m2  SpO2 100% Physical Exam  Constitutional: She is oriented to person, place, and time. She appears well-developed.  HENT:  Head: Normocephalic.  Eyes: Conjunctivae are normal.  Neck: No tracheal deviation present.  Cardiovascular:  No murmur heard. Musculoskeletal: Normal range of motion.  Patient has mild tenderness to stop of left foot with mild swelling. neurovascular exam normal.  Neurological: She is oriented to person, place, and time.  Skin: Skin is warm.  Psychiatric: She has a normal mood and affect.    ED Course  Procedures  DIAGNOSTIC STUDIES: Oxygen Saturation is 100% on RA, normal by my interpretation.    COORDINATION OF CARE: 12:08 PM Discussed treatment plan with pt at bedside and pt agreed to plan.  Labs Review Labs Reviewed - No data to display  Imaging Review No results found. I have personally reviewed and evaluated these images and lab results as part of my medical decision-making.   EKG Interpretation None      MDM   Final diagnoses:  None    Labs show uric acid level elevated. Patient also has a low potassium. Will treat patient's left foot swelling and tenderness has gout. Patient given Naprosyn and Vicodin and told to follow-up with her family doctor next week to check potassium her blood pressure in to see how her foot known.   The chart was scribed for me under my direct supervision.  I personally performed the history, physical, and medical decision making and all procedures in the evaluation of this patient.Milton Ferguson, MD 03/20/15 [phone]

## 2015-03-20 NOTE — ED Notes (Signed)
Pt reports not being able to put weight on left leg since yesterday.  No swelling noted.  Pt denies any falls or injury.

## 2015-03-20 NOTE — Discharge Instructions (Signed)
Follow up with your md next week. °

## 2015-05-12 ENCOUNTER — Encounter: Payer: Self-pay | Admitting: Nurse Practitioner

## 2015-05-12 ENCOUNTER — Ambulatory Visit (INDEPENDENT_AMBULATORY_CARE_PROVIDER_SITE_OTHER): Payer: Medicare Other | Admitting: Nurse Practitioner

## 2015-05-12 VITALS — BP 148/92 | HR 91 | Temp 96.8°F | Ht 63.0 in | Wt 148.4 lb

## 2015-05-12 DIAGNOSIS — E1122 Type 2 diabetes mellitus with diabetic chronic kidney disease: Secondary | ICD-10-CM

## 2015-05-12 DIAGNOSIS — Z6826 Body mass index (BMI) 26.0-26.9, adult: Secondary | ICD-10-CM

## 2015-05-12 DIAGNOSIS — M8580 Other specified disorders of bone density and structure, unspecified site: Secondary | ICD-10-CM | POA: Diagnosis not present

## 2015-05-12 DIAGNOSIS — I10 Essential (primary) hypertension: Secondary | ICD-10-CM | POA: Diagnosis not present

## 2015-05-12 DIAGNOSIS — E785 Hyperlipidemia, unspecified: Secondary | ICD-10-CM

## 2015-05-12 LAB — POCT GLYCOSYLATED HEMOGLOBIN (HGB A1C): Hemoglobin A1C: 6.3

## 2015-05-12 MED ORDER — FENOFIBRATE 145 MG PO TABS: 145.0000 mg | ORAL_TABLET | Freq: Every day | ORAL | Status: DC

## 2015-05-12 MED ORDER — METFORMIN HCL 1000 MG PO TABS: 1000.0000 mg | ORAL_TABLET | Freq: Two times a day (BID) | ORAL | Status: DC

## 2015-05-12 MED ORDER — HYDROCHLOROTHIAZIDE 25 MG PO TABS: 25.0000 mg | ORAL_TABLET | Freq: Every day | ORAL | Status: DC

## 2015-05-12 MED ORDER — LOSARTAN POTASSIUM 100 MG PO TABS: 100.0000 mg | ORAL_TABLET | Freq: Every day | ORAL | Status: DC

## 2015-05-12 MED ORDER — ATORVASTATIN CALCIUM 40 MG PO TABS: 40.0000 mg | ORAL_TABLET | Freq: Every day | ORAL | Status: DC

## 2015-05-12 MED ORDER — RALOXIFENE HCL 60 MG PO TABS: 60.0000 mg | ORAL_TABLET | Freq: Every day | ORAL | Status: DC

## 2015-05-12 MED ORDER — NEBIVOLOL HCL 10 MG PO TABS: 20.0000 mg | ORAL_TABLET | Freq: Every day | ORAL | Status: DC

## 2015-05-12 MED ORDER — AMLODIPINE BESYLATE 5 MG PO TABS: 5.0000 mg | ORAL_TABLET | Freq: Every day | ORAL | Status: DC

## 2015-05-12 NOTE — Progress Notes (Signed)
Subjective:    Patient ID: Jackie Hickman, female    DOB: 01-10-1933, 83 y.o.   MRN: [id number]   Patient here today for follow up of chronic medical problems.DID NOT TAKE MEDS THIS MORNING    Outpatient Encounter Prescriptions as of 05/12/2015  Medication Sig  . acetaZOLAMIDE (DIAMOX) 500 MG capsule Take 1 capsule (500 mg total) by mouth 2 (two) times daily.  [name] amLODipine (NORVASC) 5 MG tablet Take 1 tablet (5 mg total) by mouth daily.  [name] aspirin 81 MG tablet Take 81 mg by mouth daily as needed for pain.   [name] atorvastatin (LIPITOR) 40 MG tablet Take 1 tablet (40 mg total) by mouth daily.  . brimonidine-timolol (COMBIGAN) 0.2-0.5 % ophthalmic solution Place 1 drop into both eyes every 12 (twelve) hours.  . fenofibrate (TRICOR) 145 MG tablet Take 1 tablet (145 mg total) by mouth daily.  . hydrochlorothiazide (HYDRODIURIL) 25 MG tablet Take 1 tablet (25 mg total) by mouth daily.  [name] HYDROcodone-acetaminophen (NORCO/VICODIN) 5-325 MG tablet Take 1 tablet by mouth every 6 (six) hours as needed.  . latanoprost (XALATAN) 0.005 % ophthalmic solution Place 1 drop into both eyes at bedtime.  [name] losartan (COZAAR) 100 MG tablet Take 1 tablet (100 mg total) by mouth daily.  . metFORMIN (GLUCOPHAGE) 1000 MG tablet Take 1 tablet (1,000 mg total) by mouth 2 (two) times daily with a meal.  . naproxen (NAPROSYN) 500 MG tablet Take 1 tablet (500 mg total) by mouth 2 (two) times daily.  . nebivolol (BYSTOLIC) 10 MG tablet Take 2 tablets (20 mg total) by mouth daily.  . potassium chloride SA (K-DUR,KLOR-CON) 20 MEQ tablet Take 1 tablet (20 mEq total) by mouth daily.  . raloxifene (EVISTA) 60 MG tablet Take 1 tablet (60 mg total) by mouth daily.   No facility-administered encounter medications on file as of 05/12/2015.     Hypertension This is a chronic problem. The current episode started more than 1 year ago. The problem is unchanged. The problem is uncontrolled. Risk factors for coronary artery disease  include diabetes mellitus, dyslipidemia, post-menopausal state and sedentary lifestyle. Past treatments include calcium channel blockers, diuretics, angiotensin blockers and beta blockers. The current treatment provides mild improvement. Compliance problems include exercise and diet.   Hyperlipidemia This is a chronic problem. The current episode started more than 1 year ago. Recent lipid tests were reviewed and are variable. Factors aggravating her hyperlipidemia include thiazides. Current antihyperlipidemic treatment includes statins. The current treatment provides moderate improvement of lipids. Compliance problems include adherence to diet and adherence to exercise.  Risk factors for coronary artery disease include dyslipidemia and hypertension.  Diabetes She presents for her follow-up diabetic visit. She has type 2 diabetes mellitus. No MedicAlert identification noted. Her disease course has been stable. There are no hypoglycemic associated symptoms. There are no hypoglycemic complications. Symptoms are stable. Risk factors for coronary artery disease include diabetes mellitus, dyslipidemia, hypertension and post-menopausal. Current diabetic treatment includes oral agent (monotherapy). She is compliant with treatment most of the time. Her weight is stable. She is following a diabetic diet. When asked about meal planning, she reported none. She has not had a previous visit with a dietitian. She never participates in exercise. (Does not check blood sugars at home) An ACE inhibitor/angiotensin II receptor blocker is being taken. She does not see a podiatrist.Eye exam is current.  osteopenia Is currently on evista- tries to walk but I unable  To do for long periods of time.  Review of Systems  Constitutional: Negative.   HENT: Negative.   Respiratory: Negative.   Cardiovascular: Negative.   Genitourinary: Negative.   Neurological: Negative.   Psychiatric/Behavioral: Negative.   All other systems  reviewed and are negative.      Objective:   Physical Exam  Constitutional: She is oriented to person, place, and time. She appears well-developed and well-nourished.  HENT:  Nose: Nose normal.  Mouth/Throat: Oropharynx is clear and moist.  Eyes: EOM are normal.  Neck: Trachea normal, normal range of motion and full passive range of motion without pain. Neck supple. No JVD present. Carotid bruit is not present. No thyromegaly present.  Cardiovascular: Normal rate, regular rhythm, normal heart sounds and intact distal pulses.  Exam reveals no gallop and no friction rub.   No murmur heard. Pulmonary/Chest: Effort normal and breath sounds normal.  Abdominal: Soft. Bowel sounds are normal. She exhibits no distension and no mass. There is no tenderness.  Musculoskeletal: Normal range of motion.  Lymphadenopathy:    She has no cervical adenopathy.  Neurological: She is alert and oriented to person, place, and time. She has normal reflexes.  Skin: Skin is warm and dry.  Psychiatric: She has a normal mood and affect. Her behavior is normal. Judgment and thought content normal.    BP 148/92 mmHg  Pulse 91  Temp(Src) 96.8 F (36 C) (Oral)  Ht 5' 3"  (1.6 m)  Wt 148 lb 6.4 oz (67.314 kg)  BMI 26.29 kg/m2  Results for orders placed or performed in visit on 05/12/15  POCT glycosylated hemoglobin (Hb A1C)  Result Value Ref Range   Hemoglobin A1C 6.3         Assessment & Plan:   1. Type 2 diabetes mellitus with chronic kidney disease, without long-term current use of insulin, unspecified CKD stage ([facility]) CONTINUE TO WATCH CARBS IN DIET - POCT glycosylated hemoglobin (Hb A1C) - Microalbumin / creatinine urine ratio - metFORMIN (GLUCOPHAGE) 1000 MG tablet; Take 1 tablet (1,000 mg total) by mouth 2 (two) times daily with a meal.  Dispense: 180 tablet; Refill: 1  2. Hyperlipidemia Low fat diet - Lipid panel - atorvastatin (LIPITOR) 40 MG tablet; Take 1 tablet (40 mg total) by mouth  daily.  Dispense: 90 tablet; Refill: 0 - fenofibrate (TRICOR) 145 MG tablet; Take 1 tablet (145 mg total) by mouth daily.  Dispense: 30 tablet; Refill: 5  3. Essential hypertension Do not add salt to diet - CMP14+EGFR - losartan (COZAAR) 100 MG tablet; Take 1 tablet (100 mg total) by mouth daily.  Dispense: 90 tablet; Refill: 1 - nebivolol (BYSTOLIC) 10 MG tablet; Take 2 tablets (20 mg total) by mouth daily.  Dispense: 90 tablet; Refill: 1 - hydrochlorothiazide (HYDRODIURIL) 25 MG tablet; Take 1 tablet (25 mg total) by mouth daily.  Dispense: 90 tablet; Refill: 1 - amLODipine (NORVASC) 5 MG tablet; Take 1 tablet (5 mg total) by mouth daily.  Dispense: 90 tablet; Refill: 1  4. BMI 26.0-26.9,adult Discussed diet and exercise for person with BMI >25 Will recheck weight in 3-6 months   5. Osteopenia weight bearing exercises when can tolerate - raloxifene (EVISTA) 60 MG tablet; Take 1 tablet (60 mg total) by mouth daily.  Dispense: 90 tablet; Refill: 1    Labs pending Health maintenance reviewed Diet and exercise encouraged Continue all meds Follow up  In 3 months   [place], FNP

## 2015-05-12 NOTE — Patient Instructions (Signed)
Fall Prevention in the Home  Falls can cause injuries and can affect people from all age groups. There are many simple things that you can do to make your home safe and to help prevent falls. WHAT CAN I DO ON THE OUTSIDE OF MY HOME?  Regularly repair the edges of walkways and driveways and fix any cracks.  Remove high doorway thresholds.  Trim any shrubbery on the main path into your home.  Use bright outdoor lighting.  Clear walkways of debris and clutter, including tools and rocks.  Regularly check that handrails are securely fastened and in good repair. Both sides of any steps should have handrails.  Install guardrails along the edges of any raised decks or porches.  Have leaves, snow, and ice cleared regularly.  Use sand or salt on walkways during winter months.  In the garage, clean up any spills right away, including grease or oil spills. WHAT CAN I DO IN THE BATHROOM?  Use night lights.  Install grab bars by the toilet and in the tub and shower. Do not use towel bars as grab bars.  Use non-skid mats or decals on the floor of the tub or shower.  If you need to sit down while you are in the shower, use a plastic, non-slip stool..  Keep the floor dry. Immediately clean up any water that spills on the floor.  Remove soap buildup in the tub or shower on a regular basis.  Attach bath mats securely with double-sided non-slip rug tape.  Remove throw rugs and other tripping hazards from the floor. WHAT CAN I DO IN THE BEDROOM?  Use night lights.  Make sure that a bedside light is easy to reach.  Do not use oversized bedding that drapes onto the floor.  Have a firm chair that has side arms to use for getting dressed.  Remove throw rugs and other tripping hazards from the floor. WHAT CAN I DO IN THE KITCHEN?   Clean up any spills right away.  Avoid walking on wet floors.  Place frequently used items in easy-to-reach places.  If you need to reach for something  above you, use a sturdy step stool that has a grab bar.  Keep electrical cables out of the way.  Do not use floor polish or wax that makes floors slippery. If you have to use wax, make sure that it is non-skid floor wax.  Remove throw rugs and other tripping hazards from the floor. WHAT CAN I DO IN THE STAIRWAYS?  Do not leave any items on the stairs.  Make sure that there are handrails on both sides of the stairs. Fix handrails that are broken or loose. Make sure that handrails are as long as the stairways.  Check any carpeting to make sure that it is firmly attached to the stairs. Fix any carpet that is loose or worn.  Avoid having throw rugs at the top or bottom of stairways, or secure the rugs with carpet tape to prevent them from moving.  Make sure that you have a light switch at the top of the stairs and the bottom of the stairs. If you do not have them, have them installed. WHAT ARE SOME OTHER FALL PREVENTION TIPS?  Wear closed-toe shoes that fit well and support your feet. Wear shoes that have rubber soles or low heels.  When you use a stepladder, make sure that it is completely opened and that the sides are firmly locked. Have someone hold the ladder while you   are using it. Do not climb a closed stepladder.  Add color or contrast paint or tape to grab bars and handrails in your home. Place contrasting color strips on the first and last steps.  Use mobility aids as needed, such as canes, walkers, scooters, and crutches.  Turn on lights if it is dark. Replace any light bulbs that burn out.  Set up furniture so that there are clear paths. Keep the furniture in the same spot.  Fix any uneven floor surfaces.  Choose a carpet design that does not hide the edge of steps of a stairway.  Be aware of any and all pets.  Review your medicines with your healthcare provider. Some medicines can cause dizziness or changes in blood pressure, which increase your risk of falling. Talk  with your health care provider about other ways that you can decrease your risk of falls. This may include working with a physical therapist or trainer to improve your strength, balance, and endurance.   This information is not intended to replace advice given to you by your health care provider. Make sure you discuss any questions you have with your health care provider.   Document Released: 03/12/2002 Document Revised: 08/06/2014 Document Reviewed: 04/26/2014 Elsevier Interactive Patient Education 2016 [employer]  

## 2015-05-13 LAB — CMP14+EGFR
A/G RATIO: 1.3 (ref 1.1–2.5)
ALK PHOS: 68 IU/L (ref 39–117)
ALT: 4 IU/L (ref 0–32)
AST: 15 IU/L (ref 0–40)
Albumin: 3.9 g/dL (ref 3.5–4.7)
BUN/Creatinine Ratio: 15 (ref 11–26)
BUN: 18 mg/dL (ref 8–27)
Bilirubin Total: 0.3 mg/dL (ref 0.0–1.2)
CALCIUM: 8.9 mg/dL (ref 8.7–10.3)
CHLORIDE: 98 mmol/L (ref 96–106)
CO2: 21 mmol/L (ref 18–29)
Creatinine, Ser: 1.23 mg/dL — ABNORMAL HIGH (ref 0.57–1.00)
GFR calc Af Amer: 48 mL/min/{1.73_m2} — ABNORMAL LOW (ref >59)
GFR calc non Af Amer: 42 mL/min/{1.73_m2} — ABNORMAL LOW (ref >59)
GLOBULIN, TOTAL: 3.1 g/dL (ref 1.5–4.5)
Glucose: 168 mg/dL — ABNORMAL HIGH (ref 65–99)
POTASSIUM: 4 mmol/L (ref 3.5–5.2)
SODIUM: 139 mmol/L (ref 134–144)
Total Protein: 7 g/dL (ref 6.0–8.5)

## 2015-05-13 LAB — LIPID PANEL
CHOL/HDL RATIO: 3.6 ratio (ref 0.0–4.4)
CHOLESTEROL TOTAL: 196 mg/dL (ref 100–199)
HDL: 54 mg/dL (ref >39)
LDL Calculated: 101 mg/dL — ABNORMAL HIGH (ref 0–99)
TRIGLYCERIDES: 203 mg/dL — AB (ref 0–149)
VLDL Cholesterol Cal: 41 mg/dL — ABNORMAL HIGH (ref 5–40)

## 2015-05-13 LAB — MICROALBUMIN / CREATININE URINE RATIO
Creatinine, Urine: 46.2 mg/dL
MICROALB/CREAT RATIO: 283.5 mg/g{creat} — AB (ref 0.0–30.0)
Microalbumin, Urine: 131 ug/mL

## 2015-05-16 ENCOUNTER — Telehealth: Payer: Self-pay | Admitting: Nurse Practitioner

## 2015-05-16 NOTE — Telephone Encounter (Signed)
Patient's daughter notified of lab results.

## 2015-06-18 ENCOUNTER — Encounter: Payer: Self-pay | Admitting: Gastroenterology

## 2015-07-07 DIAGNOSIS — H401133 Primary open-angle glaucoma, bilateral, severe stage: Secondary | ICD-10-CM | POA: Diagnosis not present

## 2015-08-25 ENCOUNTER — Other Ambulatory Visit: Payer: Self-pay | Admitting: Nurse Practitioner

## 2016-01-08 ENCOUNTER — Other Ambulatory Visit: Payer: Self-pay | Admitting: Nurse Practitioner

## 2016-01-08 DIAGNOSIS — E1122 Type 2 diabetes mellitus with diabetic chronic kidney disease: Secondary | ICD-10-CM

## 2016-01-08 DIAGNOSIS — I10 Essential (primary) hypertension: Secondary | ICD-10-CM

## 2016-01-08 NOTE — Telephone Encounter (Signed)
LMOVM that refills sent & NTBS before next refill

## 2016-01-08 NOTE — Telephone Encounter (Signed)
Last refill without being seen 

## 2016-01-12 ENCOUNTER — Other Ambulatory Visit: Payer: Self-pay | Admitting: Nurse Practitioner

## 2016-01-12 DIAGNOSIS — I10 Essential (primary) hypertension: Secondary | ICD-10-CM

## 2016-01-12 DIAGNOSIS — E1122 Type 2 diabetes mellitus with diabetic chronic kidney disease: Secondary | ICD-10-CM

## 2016-02-15 ENCOUNTER — Emergency Department (HOSPITAL_COMMUNITY): Payer: Medicare Other

## 2016-02-15 ENCOUNTER — Emergency Department (HOSPITAL_COMMUNITY)
Admission: EM | Admit: 2016-02-15 | Discharge: 2016-02-15 | Disposition: A | Discharge status: Home / Self Care | Payer: Medicare Other | Attending: Emergency Medicine | Admitting: Emergency Medicine

## 2016-02-15 ENCOUNTER — Encounter (HOSPITAL_COMMUNITY): Payer: Self-pay | Admitting: *Deleted

## 2016-02-15 DIAGNOSIS — E119 Type 2 diabetes mellitus without complications: Secondary | ICD-10-CM | POA: Diagnosis not present

## 2016-02-15 DIAGNOSIS — Z7984 Long term (current) use of oral hypoglycemic drugs: Secondary | ICD-10-CM | POA: Insufficient documentation

## 2016-02-15 DIAGNOSIS — M109 Gout, unspecified: Secondary | ICD-10-CM | POA: Diagnosis not present

## 2016-02-15 DIAGNOSIS — M79672 Pain in left foot: Secondary | ICD-10-CM | POA: Diagnosis not present

## 2016-02-15 DIAGNOSIS — Z7982 Long term (current) use of aspirin: Secondary | ICD-10-CM | POA: Insufficient documentation

## 2016-02-15 DIAGNOSIS — Z79899 Other long term (current) drug therapy: Secondary | ICD-10-CM | POA: Diagnosis not present

## 2016-02-15 DIAGNOSIS — M10072 Idiopathic gout, left ankle and foot: Secondary | ICD-10-CM | POA: Insufficient documentation

## 2016-02-15 DIAGNOSIS — I10 Essential (primary) hypertension: Secondary | ICD-10-CM | POA: Diagnosis not present

## 2016-02-15 HISTORY — DX: Pain in left foot: M79.672

## 2016-02-15 MED ORDER — HYDROCODONE-ACETAMINOPHEN 5-325 MG PO TABS
1.0000 | ORAL_TABLET | Freq: Once | ORAL | Status: AC
  Administered 2016-02-15: 1 via ORAL
  Filled 2016-02-15: qty 1

## 2016-02-15 MED ORDER — HYDROCODONE-ACETAMINOPHEN 5-325 MG PO TABS: ORAL_TABLET | ORAL | 0 refills | Status: DC

## 2016-02-15 MED ORDER — INDOMETHACIN 25 MG PO CAPS: 25.0000 mg | ORAL_CAPSULE | Freq: Three times a day (TID) | ORAL | 0 refills | Status: DC

## 2016-02-15 NOTE — Discharge Instructions (Signed)
Take the prescriptions as directed.  Apply moist heat or ice to the area(s) of discomfort, for 15 minutes at a time, several times per day for the next few days.  Do not fall asleep on a heating or ice pack.  Call your regular medical doctor tomorrow to schedule a follow up appointment in the next 2 days.  Return to the Emergency Department immediately if worsening. ° °

## 2016-02-15 NOTE — ED Provider Notes (Signed)
[facility] DEPT Provider Note   CSN: [id number] Arrival date & time: 02/15/16  1502     History   Chief Complaint Chief Complaint  Patient presents with  . Foot Pain    HPI Jackie Hickman is a 82 y.o. female.  HPI  Pt was seen at 1520. Per pt and her family, c/o gradual onset and persistence of constant acute flair of her chronic left foot "pain" for the past 3 days. Pt describes the pain as "when my gout flairs up." Denies injury, no fevers, no rash, no focal motor weakness, no tingling/numbness in extremities.    Past Medical History:  Diagnosis Date  . Diabetes mellitus   . Glaucoma   . Gout   . Hyperlipidemia   . Hypertension   . Left foot pain     Patient Active Problem List   Diagnosis Date Noted  . BMI 26.0-26.9,adult 11/07/2014  . Hypertension 04/24/2013  . Diabetes ([place]) 04/24/2013  . Hyperlipidemia 04/24/2013    Past Surgical History:  Procedure Laterality Date  . APPENDECTOMY  1948  . EYE SURGERY     bleeding and glaucoma  . LAPAROSCOPIC CHOLECYSTECTOMY  1980    OB History    No data available       Home Medications    Prior to Admission medications   Medication Sig Start Date End Date Taking? Authorizing Provider  acetaZOLAMIDE (DIAMOX) 500 MG capsule Take 1 capsule (500 mg total) by mouth 2 (two) times daily. 11/07/14   [name] Hassell Done, FNP  amLODipine (NORVASC) 5 MG tablet Take 1 tablet (5 mg total) by mouth daily. 05/12/15   [name] Hassell Done, FNP  aspirin 81 MG tablet Take 81 mg by mouth daily as needed for pain.     Historical Provider, MD  atorvastatin (LIPITOR) 40 MG tablet Take 1 tablet (40 mg total) by mouth daily. 08/25/15   [name] Hassell Done, FNP  brimonidine-timolol (COMBIGAN) 0.2-0.5 % ophthalmic solution Place 1 drop into both eyes every 12 (twelve) hours.    Historical Provider, MD  fenofibrate (TRICOR) 145 MG tablet Take 1 tablet (145 mg total) by mouth daily. 05/12/15   [name] Hassell Done, FNP    hydrochlorothiazide (HYDRODIURIL) 25 MG tablet Take 1 tablet (25 mg total) by mouth daily. 05/12/15   [name] Hassell Done, FNP  HYDROcodone-acetaminophen (NORCO/VICODIN) 5-325 MG tablet Take 1 tablet by mouth every 6 (six) hours as needed. 03/20/15   Milton Ferguson, MD  latanoprost (XALATAN) 0.005 % ophthalmic solution Place 1 drop into both eyes at bedtime.    Historical Provider, MD  losartan (COZAAR) 100 MG tablet Take 1 tablet (100 mg total) by mouth daily. 01/13/16   Chipper Herb, MD  metFORMIN (GLUCOPHAGE) 1000 MG tablet Take 1 tablet (1,000 mg total) by mouth 2 (two) times daily with a meal. 01/13/16   Chipper Herb, MD  naproxen (NAPROSYN) 500 MG tablet Take 1 tablet (500 mg total) by mouth 2 (two) times daily. 03/20/15   Milton Ferguson, MD  nebivolol (BYSTOLIC) 10 MG tablet Take 2 tablets (20 mg total) by mouth daily. 05/12/15   [name] Hassell Done, FNP  potassium chloride SA (K-DUR,KLOR-CON) 20 MEQ tablet Take 1 tablet (20 mEq total) by mouth daily. 03/20/15   Milton Ferguson, MD  raloxifene (EVISTA) 60 MG tablet Take 1 tablet (60 mg total) by mouth daily. 05/12/15   [name] Hassell Done, FNP    Family History Family History  Problem Relation Age of Onset  . Colon cancer Neg Hx   . Stomach cancer  Neg Hx     Social History Social History  Substance Use Topics  . Smoking status: Never Smoker  . Smokeless tobacco: Never Used  . Alcohol use Yes     Comment: occasional drink     Allergies   Patient has no known allergies.   Review of Systems Review of Systems ROS: Statement: All systems negative except as marked or noted in the HPI; Constitutional: Negative for fever and chills. ; ; Eyes: Negative for eye pain, redness and discharge. ; ; ENMT: Negative for ear pain, hoarseness, nasal congestion, sinus pressure and sore throat. ; ; Cardiovascular: Negative for chest pain, palpitations, diaphoresis, dyspnea and peripheral edema. ; ; Respiratory: Negative for cough, wheezing and  stridor. ; ; Gastrointestinal: Negative for nausea, vomiting, diarrhea, abdominal pain, blood in stool, hematemesis, jaundice and rectal bleeding. . ; ; Genitourinary: Negative for dysuria, flank pain and hematuria. ; ; Musculoskeletal: +chronic left foot pain. Negative for back pain and neck pain. Negative for deformity and trauma.; ; Skin: Negative for pruritus, rash, abrasions, blisters, bruising and skin lesion.; ; Neuro: Negative for headache, lightheadedness and neck stiffness. Negative for weakness, altered level of consciousness, altered mental status, extremity weakness, paresthesias, involuntary movement, seizure and syncope.       Physical Exam Updated Vital Signs BP 143/74 (BP Location: Left Arm)   Pulse 100   Temp 97.7 F (36.5 C) (Oral)   Resp 18   Ht 5\' 2"  (1.575 m)   Wt 148 lb (67.1 kg)   SpO2 93%   BMI 27.07 kg/m   Physical Exam 1525: Physical examination:  Nursing notes reviewed; Vital signs and O2 SAT reviewed;  Constitutional: Well developed, Well nourished, Well hydrated, In no acute distress; Head:  Normocephalic, atraumatic; Eyes: EOMI, PERRL, No scleral icterus; ENMT: Mouth and pharynx normal, Mucous membranes moist; Neck: Supple, Full range of motion, No lymphadenopathy; Cardiovascular: Regular rate and rhythm, No gallop; Respiratory: Breath sounds clear & equal bilaterally, No wheezes.  Speaking full sentences with ease, Normal respiratory effort/excursion; Chest: Nontender, Movement normal; Abdomen: Soft, Nontender, Nondistended, Normal bowel sounds; Genitourinary: No CVA tenderness; Extremities: Pulses normal, +dorsal left foot tender to palp. No deformity, no soft tissue crepitus, no rash. Muscles compartments soft, palp pedal pulses.  No edema, No calf tenderness, edema or asymmetry.; Neuro: AA&Ox3, Major CN grossly intact.  Speech clear. No gross focal motor or sensory deficits in extremities.; Skin: Color normal, Warm, Dry.   ED Treatments / Results  Labs (all  labs ordered are listed, but only abnormal results are displayed)   EKG  EKG Interpretation None       Radiology   Procedures Procedures (including critical care time)  Medications Ordered in ED Medications  HYDROcodone-acetaminophen (NORCO/VICODIN) 5-325 MG per tablet 1 tablet (1 tablet Oral Given 02/15/16 1525)     Initial Impression / Assessment and Plan / ED Course  I have reviewed the triage vital signs and the nursing notes.  Pertinent labs & imaging results that were available during my care of the patient were reviewed by me and considered in my medical decision making (see chart for details).  MDM Reviewed: previous chart, nursing note and vitals Reviewed previous: x-ray Interpretation: x-ray   Dg Foot Complete Left Result Date: 02/15/2016 CLINICAL DATA:  Left foot pain without trauma.  History of gout. EXAM: LEFT FOOT - COMPLETE 3+ VIEW COMPARISON:  None. FINDINGS: Diffuse osteopenia. No fractures. Small plantar spur. Small erosion at the distal aspect of the first proximal phalanx  is probably not changed given difference in technique. IMPRESSION: No acute interval change. Electronically Signed   By: Dorise Bullion III M.D   On: 02/15/2016 15:46    1605:  XR reassuring. Tx symptomatically, f/u PMD. Dx and testing d/w pt and family.  Questions answered.  Verb understanding, agreeable to d/c home with outpt f/u.   Final Clinical Impressions(s) / ED Diagnoses   Final diagnoses:  None    New Prescriptions New Prescriptions   No medications on file     Francine Graven, DO 02/18/16 1253

## 2016-02-15 NOTE — ED Triage Notes (Signed)
Pt reports left foot pain that started this past week. Left foot slightly swollen. Pt reports she has gout and feels this is might be a flare up. Pt doesn't take gout medication at home but has been using ASA at home without relief. Pt unable to ambulate due to the pain.

## 2016-02-23 ENCOUNTER — Encounter (HOSPITAL_COMMUNITY): Payer: Self-pay | Admitting: Emergency Medicine

## 2016-02-23 ENCOUNTER — Inpatient Hospital Stay (HOSPITAL_COMMUNITY): Payer: Medicare Other

## 2016-02-23 ENCOUNTER — Inpatient Hospital Stay (HOSPITAL_COMMUNITY)
Admission: EM | Admit: 2016-02-23 | Discharge: 2016-02-26 | DRG: 812 | Disposition: A | Discharge status: Skilled Nursing Facility | Payer: Medicare Other | Attending: Internal Medicine | Admitting: Internal Medicine

## 2016-02-23 DIAGNOSIS — E46 Unspecified protein-calorie malnutrition: Secondary | ICD-10-CM | POA: Diagnosis not present

## 2016-02-23 DIAGNOSIS — I10 Essential (primary) hypertension: Secondary | ICD-10-CM | POA: Diagnosis not present

## 2016-02-23 DIAGNOSIS — K219 Gastro-esophageal reflux disease without esophagitis: Secondary | ICD-10-CM | POA: Diagnosis not present

## 2016-02-23 DIAGNOSIS — R2689 Other abnormalities of gait and mobility: Secondary | ICD-10-CM

## 2016-02-23 DIAGNOSIS — Z86010 Personal history of colonic polyps: Secondary | ICD-10-CM | POA: Diagnosis not present

## 2016-02-23 DIAGNOSIS — E440 Moderate protein-calorie malnutrition: Secondary | ICD-10-CM

## 2016-02-23 DIAGNOSIS — H409 Unspecified glaucoma: Secondary | ICD-10-CM | POA: Diagnosis present

## 2016-02-23 DIAGNOSIS — M109 Gout, unspecified: Secondary | ICD-10-CM | POA: Diagnosis not present

## 2016-02-23 DIAGNOSIS — E1121 Type 2 diabetes mellitus with diabetic nephropathy: Secondary | ICD-10-CM

## 2016-02-23 DIAGNOSIS — E1165 Type 2 diabetes mellitus with hyperglycemia: Secondary | ICD-10-CM | POA: Diagnosis not present

## 2016-02-23 DIAGNOSIS — G629 Polyneuropathy, unspecified: Secondary | ICD-10-CM

## 2016-02-23 DIAGNOSIS — E441 Mild protein-calorie malnutrition: Secondary | ICD-10-CM | POA: Diagnosis not present

## 2016-02-23 DIAGNOSIS — Z820 Family history of epilepsy and other diseases of the nervous system: Secondary | ICD-10-CM | POA: Diagnosis not present

## 2016-02-23 DIAGNOSIS — I129 Hypertensive chronic kidney disease with stage 1 through stage 4 chronic kidney disease, or unspecified chronic kidney disease: Secondary | ICD-10-CM | POA: Diagnosis not present

## 2016-02-23 DIAGNOSIS — Z7401 Bed confinement status: Secondary | ICD-10-CM | POA: Diagnosis not present

## 2016-02-23 DIAGNOSIS — Z66 Do not resuscitate: Secondary | ICD-10-CM | POA: Diagnosis present

## 2016-02-23 DIAGNOSIS — Z6828 Body mass index (BMI) 28.0-28.9, adult: Secondary | ICD-10-CM | POA: Diagnosis not present

## 2016-02-23 DIAGNOSIS — N183 Chronic kidney disease, stage 3 (moderate): Secondary | ICD-10-CM | POA: Diagnosis present

## 2016-02-23 DIAGNOSIS — D120 Benign neoplasm of cecum: Secondary | ICD-10-CM | POA: Diagnosis not present

## 2016-02-23 DIAGNOSIS — N179 Acute kidney failure, unspecified: Secondary | ICD-10-CM | POA: Diagnosis present

## 2016-02-23 DIAGNOSIS — R32 Unspecified urinary incontinence: Secondary | ICD-10-CM | POA: Diagnosis present

## 2016-02-23 DIAGNOSIS — R627 Adult failure to thrive: Secondary | ICD-10-CM | POA: Diagnosis present

## 2016-02-23 DIAGNOSIS — R109 Unspecified abdominal pain: Secondary | ICD-10-CM | POA: Diagnosis not present

## 2016-02-23 DIAGNOSIS — M542 Cervicalgia: Secondary | ICD-10-CM

## 2016-02-23 DIAGNOSIS — R279 Unspecified lack of coordination: Secondary | ICD-10-CM | POA: Diagnosis not present

## 2016-02-23 DIAGNOSIS — F1010 Alcohol abuse, uncomplicated: Secondary | ICD-10-CM | POA: Diagnosis present

## 2016-02-23 DIAGNOSIS — M6281 Muscle weakness (generalized): Secondary | ICD-10-CM | POA: Diagnosis not present

## 2016-02-23 DIAGNOSIS — Z7984 Long term (current) use of oral hypoglycemic drugs: Secondary | ICD-10-CM

## 2016-02-23 DIAGNOSIS — E1122 Type 2 diabetes mellitus with diabetic chronic kidney disease: Secondary | ICD-10-CM | POA: Diagnosis not present

## 2016-02-23 DIAGNOSIS — R531 Weakness: Secondary | ICD-10-CM | POA: Diagnosis not present

## 2016-02-23 DIAGNOSIS — E876 Hypokalemia: Secondary | ICD-10-CM | POA: Diagnosis not present

## 2016-02-23 DIAGNOSIS — N189 Chronic kidney disease, unspecified: Secondary | ICD-10-CM

## 2016-02-23 DIAGNOSIS — D649 Anemia, unspecified: Secondary | ICD-10-CM | POA: Diagnosis not present

## 2016-02-23 DIAGNOSIS — R404 Transient alteration of awareness: Secondary | ICD-10-CM | POA: Diagnosis not present

## 2016-02-23 DIAGNOSIS — E1140 Type 2 diabetes mellitus with diabetic neuropathy, unspecified: Secondary | ICD-10-CM | POA: Diagnosis present

## 2016-02-23 DIAGNOSIS — K2900 Acute gastritis without bleeding: Secondary | ICD-10-CM | POA: Diagnosis not present

## 2016-02-23 DIAGNOSIS — M8580 Other specified disorders of bone density and structure, unspecified site: Secondary | ICD-10-CM | POA: Diagnosis not present

## 2016-02-23 DIAGNOSIS — E785 Hyperlipidemia, unspecified: Secondary | ICD-10-CM | POA: Diagnosis not present

## 2016-02-23 DIAGNOSIS — M47812 Spondylosis without myelopathy or radiculopathy, cervical region: Secondary | ICD-10-CM | POA: Diagnosis not present

## 2016-02-23 DIAGNOSIS — M19072 Primary osteoarthritis, left ankle and foot: Secondary | ICD-10-CM | POA: Diagnosis not present

## 2016-02-23 DIAGNOSIS — M47816 Spondylosis without myelopathy or radiculopathy, lumbar region: Secondary | ICD-10-CM | POA: Diagnosis not present

## 2016-02-23 DIAGNOSIS — E1129 Type 2 diabetes mellitus with other diabetic kidney complication: Secondary | ICD-10-CM | POA: Diagnosis present

## 2016-02-23 DIAGNOSIS — E119 Type 2 diabetes mellitus without complications: Secondary | ICD-10-CM | POA: Diagnosis not present

## 2016-02-23 DIAGNOSIS — R159 Full incontinence of feces: Secondary | ICD-10-CM

## 2016-02-23 DIAGNOSIS — K3189 Other diseases of stomach and duodenum: Secondary | ICD-10-CM | POA: Diagnosis not present

## 2016-02-23 DIAGNOSIS — M5135 Other intervertebral disc degeneration, thoracolumbar region: Secondary | ICD-10-CM | POA: Diagnosis not present

## 2016-02-23 LAB — CBC WITH DIFFERENTIAL/PLATELET
Basophils Absolute: 0 10*3/uL (ref 0.0–0.1)
Basophils Relative: 0 %
Eosinophils Absolute: 0 10*3/uL (ref 0.0–0.7)
Eosinophils Relative: 0 %
HCT: 18 % — ABNORMAL LOW (ref 36.0–46.0)
Hemoglobin: 5.7 g/dL — CL (ref 12.0–15.0)
Lymphocytes Relative: 9 %
Lymphs Abs: 1.1 10*3/uL (ref 0.7–4.0)
MCH: 28.5 pg (ref 26.0–34.0)
MCHC: 31.7 g/dL (ref 30.0–36.0)
MCV: 90 fL (ref 78.0–100.0)
Monocytes Absolute: 0.8 10*3/uL (ref 0.1–1.0)
Monocytes Relative: 7 %
Neutro Abs: 9.7 10*3/uL — ABNORMAL HIGH (ref 1.7–7.7)
Neutrophils Relative %: 84 %
Platelets: 340 10*3/uL (ref 150–400)
RBC: 2 MIL/uL — ABNORMAL LOW (ref 3.87–5.11)
RDW: 15.9 % — ABNORMAL HIGH (ref 11.5–15.5)
WBC: 11.6 10*3/uL — ABNORMAL HIGH (ref 4.0–10.5)

## 2016-02-23 LAB — BASIC METABOLIC PANEL
Anion gap: 13 (ref 5–15)
BUN: 37 mg/dL — ABNORMAL HIGH (ref 6–20)
CO2: 29 mmol/L (ref 22–32)
Calcium: 7.2 mg/dL — ABNORMAL LOW (ref 8.9–10.3)
Chloride: 99 mmol/L — ABNORMAL LOW (ref 101–111)
Creatinine, Ser: 2.43 mg/dL — ABNORMAL HIGH (ref 0.44–1.00)
GFR calc Af Amer: 20 mL/min — ABNORMAL LOW (ref >60)
GFR calc non Af Amer: 18 mL/min — ABNORMAL LOW (ref >60)
Glucose, Bld: 170 mg/dL — ABNORMAL HIGH (ref 65–99)
Potassium: 2.8 mmol/L — ABNORMAL LOW (ref 3.5–5.1)
Sodium: 141 mmol/L (ref 135–145)

## 2016-02-23 LAB — HEPATIC FUNCTION PANEL
ALBUMIN: 2.6 g/dL — AB (ref 3.5–5.0)
ALT: 9 U/L — AB (ref 14–54)
AST: 18 U/L (ref 15–41)
Alkaline Phosphatase: 63 U/L (ref 38–126)
BILIRUBIN DIRECT: 0.1 mg/dL (ref 0.1–0.5)
BILIRUBIN TOTAL: 0.6 mg/dL (ref 0.3–1.2)
Indirect Bilirubin: 0.5 mg/dL (ref 0.3–0.9)
Total Protein: 7.5 g/dL (ref 6.5–8.1)

## 2016-02-23 LAB — PROTIME-INR
INR: 1.26
Prothrombin Time: 15.8 seconds — ABNORMAL HIGH (ref 11.4–15.2)

## 2016-02-23 LAB — MAGNESIUM: Magnesium: 1.2 mg/dL — ABNORMAL LOW (ref 1.7–2.4)

## 2016-02-23 LAB — ABO/RH: ABO/RH(D): O POS

## 2016-02-23 LAB — POC OCCULT BLOOD, ED: Fecal Occult Bld: NEGATIVE

## 2016-02-23 LAB — PREPARE RBC (CROSSMATCH)

## 2016-02-23 LAB — GLUCOSE, CAPILLARY: Glucose-Capillary: 287 mg/dL — ABNORMAL HIGH (ref 65–99)

## 2016-02-23 MED ORDER — LATANOPROST 0.005 % OP SOLN
OPHTHALMIC | Status: AC
  Filled 2016-02-23: qty 2.5

## 2016-02-23 MED ORDER — FOLIC ACID 1 MG PO TABS
1.0000 mg | ORAL_TABLET | Freq: Every day | ORAL | Status: DC
  Administered 2016-02-23 – 2016-02-26 (×4): 1 mg via ORAL
  Filled 2016-02-23 (×4): qty 1

## 2016-02-23 MED ORDER — LACTATED RINGERS IV SOLN
INTRAVENOUS | Status: DC
  Administered 2016-02-23: via INTRAVENOUS

## 2016-02-23 MED ORDER — POTASSIUM CHLORIDE CRYS ER 20 MEQ PO TBCR
40.0000 meq | EXTENDED_RELEASE_TABLET | Freq: Once | ORAL | Status: AC
  Administered 2016-02-24: 40 meq via ORAL
  Filled 2016-02-23: qty 2

## 2016-02-23 MED ORDER — FENOFIBRATE 160 MG PO TABS
160.0000 mg | ORAL_TABLET | Freq: Every day | ORAL | Status: DC
  Administered 2016-02-23 – 2016-02-26 (×4): 160 mg via ORAL
  Filled 2016-02-23 (×4): qty 1

## 2016-02-23 MED ORDER — LORAZEPAM 2 MG/ML IJ SOLN: 1.0000 mg | Freq: Four times a day (QID) | INTRAMUSCULAR | Status: AC | PRN

## 2016-02-23 MED ORDER — ACETAMINOPHEN 325 MG PO TABS: 650.0000 mg | ORAL_TABLET | Freq: Four times a day (QID) | ORAL | Status: DC | PRN

## 2016-02-23 MED ORDER — HYDROMORPHONE HCL 1 MG/ML IJ SOLN
0.7500 mg | Freq: Once | INTRAMUSCULAR | Status: AC
  Administered 2016-02-23: 0.75 mg via INTRAVENOUS
  Filled 2016-02-23: qty 1

## 2016-02-23 MED ORDER — SODIUM CHLORIDE 0.9 % IV SOLN
Freq: Once | INTRAVENOUS | Status: AC
  Administered 2016-02-23: 15:00:00 via INTRAVENOUS

## 2016-02-23 MED ORDER — PREDNISONE 20 MG PO TABS
40.0000 mg | ORAL_TABLET | Freq: Once | ORAL | Status: AC
  Administered 2016-02-23: 40 mg via ORAL
  Filled 2016-02-23: qty 2

## 2016-02-23 MED ORDER — SODIUM CHLORIDE 0.9% FLUSH
3.0000 mL | Freq: Two times a day (BID) | INTRAVENOUS | Status: DC
  Administered 2016-02-24 – 2016-02-25 (×4): 3 mL via INTRAVENOUS

## 2016-02-23 MED ORDER — THIAMINE HCL 100 MG/ML IJ SOLN: 100.0000 mg | Freq: Every day | INTRAMUSCULAR | Status: DC

## 2016-02-23 MED ORDER — BRIMONIDINE TARTRATE 0.2 % OP SOLN
OPHTHALMIC | Status: AC
  Filled 2016-02-23: qty 5

## 2016-02-23 MED ORDER — INSULIN ASPART 100 UNIT/ML ~~LOC~~ SOLN
0.0000 [IU] | Freq: Three times a day (TID) | SUBCUTANEOUS | Status: DC
  Administered 2016-02-24: 2 [IU] via SUBCUTANEOUS
  Administered 2016-02-24: 8 [IU] via SUBCUTANEOUS

## 2016-02-23 MED ORDER — ACETAMINOPHEN 650 MG RE SUPP
650.0000 mg | Freq: Four times a day (QID) | RECTAL | Status: DC | PRN
Start: 2016-02-23 — End: 2016-02-27

## 2016-02-23 MED ORDER — ONDANSETRON HCL 4 MG/2ML IJ SOLN: 4.0000 mg | Freq: Four times a day (QID) | INTRAMUSCULAR | Status: DC | PRN

## 2016-02-23 MED ORDER — HYDRALAZINE HCL 20 MG/ML IJ SOLN
10.0000 mg | INTRAMUSCULAR | Status: DC | PRN
  Filled 2016-02-23: qty 1

## 2016-02-23 MED ORDER — NEBIVOLOL HCL 10 MG PO TABS
20.0000 mg | ORAL_TABLET | Freq: Every day | ORAL | Status: DC
  Administered 2016-02-24 – 2016-02-26 (×3): 20 mg via ORAL
  Filled 2016-02-23 (×4): qty 2

## 2016-02-23 MED ORDER — ADULT MULTIVITAMIN W/MINERALS CH
1.0000 | ORAL_TABLET | Freq: Every day | ORAL | Status: DC
  Administered 2016-02-23 – 2016-02-26 (×4): 1 via ORAL
  Filled 2016-02-23 (×4): qty 1

## 2016-02-23 MED ORDER — BRIMONIDINE TARTRATE-TIMOLOL 0.2-0.5 % OP SOLN: 1.0000 [drp] | Freq: Two times a day (BID) | OPHTHALMIC | Status: DC

## 2016-02-23 MED ORDER — VITAMIN B-1 100 MG PO TABS
100.0000 mg | ORAL_TABLET | Freq: Every day | ORAL | Status: DC
  Administered 2016-02-24 – 2016-02-26 (×4): 100 mg via ORAL
  Filled 2016-02-23 (×4): qty 1

## 2016-02-23 MED ORDER — HYDROCODONE-ACETAMINOPHEN 5-325 MG PO TABS
1.0000 | ORAL_TABLET | Freq: Four times a day (QID) | ORAL | Status: DC | PRN
  Administered 2016-02-24 – 2016-02-26 (×4): 1 via ORAL
  Filled 2016-02-23 (×4): qty 1

## 2016-02-23 MED ORDER — TIMOLOL MALEATE 0.5 % OP SOLN
1.0000 [drp] | Freq: Two times a day (BID) | OPHTHALMIC | Status: DC
  Administered 2016-02-24 – 2016-02-25 (×5): 1 [drp] via OPHTHALMIC
  Filled 2016-02-23: qty 5

## 2016-02-23 MED ORDER — LORAZEPAM 1 MG PO TABS: 1.0000 mg | ORAL_TABLET | Freq: Four times a day (QID) | ORAL | Status: AC | PRN

## 2016-02-23 MED ORDER — BRIMONIDINE TARTRATE 0.2 % OP SOLN
1.0000 [drp] | Freq: Two times a day (BID) | OPHTHALMIC | Status: DC
  Administered 2016-02-24 – 2016-02-25 (×5): 1 [drp] via OPHTHALMIC
  Filled 2016-02-23: qty 5

## 2016-02-23 MED ORDER — POTASSIUM CHLORIDE IN NACL 20-0.9 MEQ/L-% IV SOLN
Freq: Once | INTRAVENOUS | Status: AC
  Administered 2016-02-23: 13:00:00 via INTRAVENOUS
  Filled 2016-02-23: qty 1000

## 2016-02-23 MED ORDER — ONDANSETRON HCL 4 MG PO TABS: 4.0000 mg | ORAL_TABLET | Freq: Four times a day (QID) | ORAL | Status: DC | PRN

## 2016-02-23 MED ORDER — ATORVASTATIN CALCIUM 40 MG PO TABS
40.0000 mg | ORAL_TABLET | Freq: Every day | ORAL | Status: DC
  Administered 2016-02-23 – 2016-02-26 (×4): 40 mg via ORAL
  Filled 2016-02-23 (×4): qty 1

## 2016-02-23 MED ORDER — MAGNESIUM OXIDE 400 (241.3 MG) MG PO TABS
400.0000 mg | ORAL_TABLET | Freq: Once | ORAL | Status: AC
  Administered 2016-02-23: 400 mg via ORAL
  Filled 2016-02-23: qty 1

## 2016-02-23 MED ORDER — LATANOPROST 0.005 % OP SOLN
1.0000 [drp] | Freq: Every day | OPHTHALMIC | Status: DC
  Administered 2016-02-24 – 2016-02-26 (×3): 1 [drp] via OPHTHALMIC
  Filled 2016-02-23: qty 2.5

## 2016-02-23 MED ORDER — AMLODIPINE BESYLATE 5 MG PO TABS
5.0000 mg | ORAL_TABLET | Freq: Every day | ORAL | Status: DC
  Administered 2016-02-24: 5 mg via ORAL
  Filled 2016-02-23 (×2): qty 1

## 2016-02-23 NOTE — ED Triage Notes (Signed)
Pt here the other day dx with gout. Pt states pain has continued in both feet with swelling. States has made her weak. Pt also c/o right knee pain x 1 day also. A/o

## 2016-02-23 NOTE — ED Provider Notes (Signed)
[facility] DEPT Provider Note   CSN: [id number] Arrival date & time: 02/23/16  1136  By signing my name below, I, Higinio Plan, attest that this documentation has been prepared under the direction and in the presence of Virgel Manifold, MD . Electronically Signed: Higinio Plan, Scribe. 02/23/2016. 1:05 PM.  History   Chief Complaint Chief Complaint  Patient presents with  . Foot Pain  . Fatigue   The history is provided by the patient and a relative. No language interpreter was used.   HPI Comments: Jackie Hickman is a 81 y.o. female with PMHx of DM and HTN, who presents to the Emergency Department complaining of gradually worsening, bilateral foot pain and swelling that began ~1 week ago. Pt reports she visited the ED on 02/15/16 and was diagnosed with gout and prescribed Vicodin. She notes she has been taking this medication for her symptoms as prescribed with no relief. Pt also complains of gradually worsening, right knee pain and swelling that began a few days ago. She notes her prescribed pain medication has not relieved this pain either.   Pt's daughter reports pt has had decreased appetite over the past several days. Pt notes associated intermittent, nausea and dizziness but states these episodes "don't last for very long." She denies shortness of breath, weakness, blood in her stool, abdominal pain, difficulty urinating and hx of anemia, GI bleed, GERD or ulcers. Per daughter, pt is currently taking coumadin; however, pt cannot remember why this medication was prescribed to her.   Past Medical History:  Diagnosis Date  . Diabetes mellitus   . Glaucoma   . Gout   . Hyperlipidemia   . Hypertension   . Left foot pain     Patient Active Problem List   Diagnosis Date Noted  . BMI 26.0-26.9,adult 11/07/2014  . Hypertension 04/24/2013  . Diabetes ([place]) 04/24/2013  . Hyperlipidemia 04/24/2013    Past Surgical History:  Procedure Laterality Date  . APPENDECTOMY  1948  . EYE  SURGERY     bleeding and glaucoma  . LAPAROSCOPIC CHOLECYSTECTOMY  1980    OB History    No data available     Home Medications    Prior to Admission medications   Medication Sig Start Date End Date Taking? Authorizing Provider  HYDROcodone-acetaminophen (NORCO/VICODIN) 5-325 MG tablet 1 tab PO q12 hours prn pain 02/15/16  Yes Francine Graven, DO  acetaZOLAMIDE (DIAMOX) 500 MG capsule Take 1 capsule (500 mg total) by mouth 2 (two) times daily. 11/07/14   [name] Hassell Done, FNP  amLODipine (NORVASC) 5 MG tablet Take 1 tablet (5 mg total) by mouth daily. 05/12/15   [name] Hassell Done, FNP  aspirin 81 MG tablet Take 81 mg by mouth daily as needed for pain.     Historical Provider, MD  atorvastatin (LIPITOR) 40 MG tablet Take 1 tablet (40 mg total) by mouth daily. 08/25/15   [name] Hassell Done, FNP  brimonidine-timolol (COMBIGAN) 0.2-0.5 % ophthalmic solution Place 1 drop into both eyes every 12 (twelve) hours.    Historical Provider, MD  fenofibrate (TRICOR) 145 MG tablet Take 1 tablet (145 mg total) by mouth daily. 05/12/15   [name] Hassell Done, FNP  hydrochlorothiazide (HYDRODIURIL) 25 MG tablet Take 1 tablet (25 mg total) by mouth daily. 05/12/15   [name] Hassell Done, FNP  indomethacin (INDOCIN) 25 MG capsule Take 1 capsule (25 mg total) by mouth 3 (three) times daily with meals. For the next 5 days 02/15/16   Francine Graven, DO  latanoprost (XALATAN) 0.005 % ophthalmic  solution Place 1 drop into both eyes at bedtime.    Historical Provider, MD  losartan (COZAAR) 100 MG tablet Take 1 tablet (100 mg total) by mouth daily. 01/13/16   Chipper Herb, MD  metFORMIN (GLUCOPHAGE) 1000 MG tablet Take 1 tablet (1,000 mg total) by mouth 2 (two) times daily with a meal. 01/13/16   Chipper Herb, MD  naproxen (NAPROSYN) 500 MG tablet Take 1 tablet (500 mg total) by mouth 2 (two) times daily. 03/20/15   Milton Ferguson, MD  nebivolol (BYSTOLIC) 10 MG tablet Take 2 tablets (20 mg total) by mouth  daily. 05/12/15   [name] Hassell Done, FNP  potassium chloride SA (K-DUR,KLOR-CON) 20 MEQ tablet Take 1 tablet (20 mEq total) by mouth daily. 03/20/15   Milton Ferguson, MD  raloxifene (EVISTA) 60 MG tablet Take 1 tablet (60 mg total) by mouth daily. 05/12/15   [name] Hassell Done, FNP    Family History Family History  Problem Relation Age of Onset  . Colon cancer Neg Hx   . Stomach cancer Neg Hx     Social History Social History  Substance Use Topics  . Smoking status: Never Smoker  . Smokeless tobacco: Never Used  . Alcohol use Yes     Comment: occasional drink     Allergies   Patient has no known allergies.   Review of Systems Review of Systems  Constitutional: Positive for appetite change. Negative for fever.  Respiratory: Negative for shortness of breath.   Gastrointestinal: Positive for nausea. Negative for abdominal pain and blood in stool.  Genitourinary: Negative for difficulty urinating.  Musculoskeletal: Positive for arthralgias (bilateral feet and right knee) and joint swelling (right knee).  Neurological: Positive for dizziness. Negative for weakness.   Physical Exam Updated Vital Signs BP 150/98   Pulse 84   Temp 98.1 F (36.7 C) (Oral)   Resp 18   Ht 5\' 2"  (1.575 m)   Wt 148 lb (67.1 kg)   SpO2 99%   BMI 27.07 kg/m   Physical Exam  Constitutional: She is oriented to person, place, and time. She appears well-developed and well-nourished.  Appears tired, opacity to her left eye.   HENT:  Head: Normocephalic.  Eyes: EOM are normal.  Neck: Normal range of motion.  Pulmonary/Chest: Effort normal.  Abdominal: She exhibits no distension.  Musculoskeletal: She exhibits edema and tenderness.  Mild swelling bilateral feet. Some TTP of the left forefoot. Small right knee effusion, TTP and increased pain with ROM.   Neurological: She is alert and oriented to person, place, and time.  Psychiatric: She has a normal mood and affect.  Nursing note and vitals  reviewed.  ED Treatments / Results  Labs (all labs ordered are listed, but only abnormal results are displayed) Labs Reviewed  CBC WITH DIFFERENTIAL/PLATELET - Abnormal; Notable for the following:       Result Value   WBC 11.6 (*)    RBC 2.00 (*)    Hemoglobin 5.7 (*)    HCT 18.0 (*)    RDW 15.9 (*)    Neutro Abs 9.7 (*)    All other components within normal limits  BASIC METABOLIC PANEL - Abnormal; Notable for the following:    Potassium 2.8 (*)    Chloride 99 (*)    Glucose, Bld 170 (*)    BUN 37 (*)    Creatinine, Ser 2.43 (*)    Calcium 7.2 (*)    GFR calc non Af Amer 18 (*)    GFR  calc Af Amer 20 (*)    All other components within normal limits  PROTIME-INR - Abnormal; Notable for the following:    Prothrombin Time 15.8 (*)    All other components within normal limits  HEPATIC FUNCTION PANEL - Abnormal; Notable for the following:    Albumin 2.6 (*)    ALT 9 (*)    All other components within normal limits  BASIC METABOLIC PANEL - Abnormal; Notable for the following:    Potassium 2.6 (*)    Glucose, Bld 286 (*)    BUN 40 (*)    Creatinine, Ser 2.10 (*)    Calcium 6.7 (*)    GFR calc non Af Amer 21 (*)    GFR calc Af Amer 24 (*)    All other components within normal limits  CBC - Abnormal; Notable for the following:    WBC 11.6 (*)    RBC 2.60 (*)    Hemoglobin 7.5 (*)    HCT 22.6 (*)    RDW 15.6 (*)    All other components within normal limits  SEDIMENTATION RATE - Abnormal; Notable for the following:    Sed Rate 135 (*)    All other components within normal limits  FOLATE RBC - Abnormal; Notable for the following:    Hematocrit 23.0 (*)    All other components within normal limits  MAGNESIUM - Abnormal; Notable for the following:    Magnesium 1.2 (*)    All other components within normal limits  HEMOGLOBIN A1C - Abnormal; Notable for the following:    Hgb A1c MFr Bld 7.0 (*)    All other components within normal limits  GLUCOSE, CAPILLARY - Abnormal;  Notable for the following:    Glucose-Capillary 287 (*)    All other components within normal limits  MAGNESIUM - Abnormal; Notable for the following:    Magnesium 1.2 (*)    All other components within normal limits  URINALYSIS, ROUTINE W REFLEX MICROSCOPIC (NOT AT [facility]) - Abnormal; Notable for the following:    Protein, ur TRACE (*)    Leukocytes, UA TRACE (*)    All other components within normal limits  GLUCOSE, CAPILLARY - Abnormal; Notable for the following:    Glucose-Capillary 268 (*)    All other components within normal limits  GLUCOSE, CAPILLARY - Abnormal; Notable for the following:    Glucose-Capillary 142 (*)    All other components within normal limits  URINE MICROSCOPIC-ADD ON - Abnormal; Notable for the following:    Squamous Epithelial / LPF 0-5 (*)    Bacteria, UA FEW (*)    All other components within normal limits  HEMOGLOBIN AND HEMATOCRIT, BLOOD - Abnormal; Notable for the following:    Hemoglobin 8.5 (*)    HCT 25.6 (*)    All other components within normal limits  GLUCOSE, CAPILLARY - Abnormal; Notable for the following:    Glucose-Capillary 181 (*)    All other components within normal limits  BASIC METABOLIC PANEL - Abnormal; Notable for the following:    Potassium 3.3 (*)    Glucose, Bld 116 (*)    BUN 43 (*)    Creatinine, Ser 2.34 (*)    Calcium 7.6 (*)    GFR calc non Af Amer 18 (*)    GFR calc Af Amer 21 (*)    All other components within normal limits  CBC - Abnormal; Notable for the following:    WBC 10.9 (*)    RBC 2.83 (*)  Hemoglobin 8.3 (*)    HCT 24.9 (*)    RDW 16.0 (*)    All other components within normal limits  GLUCOSE, CAPILLARY - Abnormal; Notable for the following:    Glucose-Capillary 188 (*)    All other components within normal limits  GLUCOSE, CAPILLARY - Abnormal; Notable for the following:    Glucose-Capillary 118 (*)    All other components within normal limits  BASIC METABOLIC PANEL - Abnormal; Notable for the  following:    Potassium 2.9 (*)    Glucose, Bld 101 (*)    BUN 38 (*)    Creatinine, Ser 2.22 (*)    Calcium 8.4 (*)    GFR calc non Af Amer 20 (*)    GFR calc Af Amer 23 (*)    All other components within normal limits  CBC - Abnormal; Notable for the following:    WBC 10.9 (*)    RBC 2.94 (*)    Hemoglobin 8.6 (*)    HCT 26.6 (*)    RDW 16.2 (*)    All other components within normal limits  GLUCOSE, CAPILLARY - Abnormal; Notable for the following:    Glucose-Capillary 120 (*)    All other components within normal limits  GLUCOSE, CAPILLARY - Abnormal; Notable for the following:    Glucose-Capillary 222 (*)    All other components within normal limits  VITAMIN B12  GAMMA GT  MAGNESIUM  GLUCOSE, CAPILLARY  GLUCOSE, CAPILLARY  GLUCOSE, CAPILLARY  GLUCOSE, CAPILLARY  GLUCOSE, CAPILLARY  GLUCOSE, CAPILLARY  POC OCCULT BLOOD, ED  TYPE AND SCREEN  PREPARE RBC (CROSSMATCH)  ABO/RH  PREPARE RBC (CROSSMATCH)  SURGICAL PATHOLOGY    EKG  EKG Interpretation None       Radiology No results found.  Procedures Procedures (including critical care time)  CRITICAL CARE Performed by: Virgel Manifold Total critical care time: 35 minutes Critical care time was exclusive of separately billable procedures and treating other patients. Critical care was necessary to treat or prevent imminent or life-threatening deterioration. Critical care was time spent personally by me on the following activities: development of treatment plan with patient and/or surrogate as well as nursing, discussions with consultants, evaluation of patient's response to treatment, examination of patient, obtaining history from patient or surrogate, ordering and performing treatments and interventions, ordering and review of laboratory studies, ordering and review of radiographic studies, pulse oximetry and re-evaluation of patient's condition.   Medications Ordered in ED Medications - No data to  display  DIAGNOSTIC STUDIES:  Oxygen Saturation is 99% on RA, normal by my interpretation.    COORDINATION OF CARE:  12:45 PM Discussed treatment plan with pt and daughters at bedside and they agreed to plan.  Initial Impression / Assessment and Plan / ED Course  I have reviewed the triage vital signs and the nursing notes.  Pertinent labs & imaging results that were available during my care of the patient were reviewed by me and considered in my medical decision making (see chart for details).  Clinical Course    83yF with multiple complaints. Joint most likley gout. Severe anemia would explain weakness. Transfuse. Admit.   I personally performed the services described in this documentation, which was scribed in my presence. The recorded information has been reviewed and is accurate.   Final Clinical Impressions(s) / ED Diagnoses   Final diagnoses:  Anemia, unspecified type  AKI (acute kidney injury) ([place])  Hypokalemia  Gout of multiple sites, unspecified cause, unspecified chronicity  New Prescriptions New Prescriptions   No medications on file     Virgel Manifold, MD 03/04/16 1459

## 2016-02-23 NOTE — ED Notes (Signed)
CRITICAL VALUE ALERT  Critical value received:  hgb 5.7  Date of notification:  02/23/16  Time of notification:  1250  Critical value read back:Yes.    Nurse who received alert:  c Dayten Juba rn  Responding MD: Dr. Wilson Singer  Time MD responded:  1250

## 2016-02-23 NOTE — ED Notes (Signed)
Pt had a small bm with no blood present. Pt cleaned, new sheets on bed and dressed in new gown.

## 2016-02-23 NOTE — ED Notes (Signed)
Admitting MD in room with pt.

## 2016-02-23 NOTE — H&P (Signed)
History and Physical    Jackie Hickman WUX:[id number] DOB: April 27, 1932 DOA: 02/23/2016  PCP: [name], FNP Consultants:  Nephrology - Justin Mend Patient coming from: home - lives alone; [place]: daughter, [phone]  Chief Complaint: foot pain, fatigue  HPI: Jackie Hickman is a 82 y.o. female with medical history significant of DM, HTN, HLD, gout, and glaucoma presenting with having pain in her leg.  R leg, pain and numbness in her foot.  Was seen in ER last Sunday (Nov 13) and was given medication for pain (Vicodin).  Unable to stand up and walk, both feet just hurt so bad and swollen on top.  Numbness would come and go since then.  Nothing seemed to make it better or worse.  Unable to stand at all, which is different for her.   A week ago started with urine and fecal incontinence - too slow at getting up to make it successfully to the bathroom.    +urgency - urinary and fecal.   Previously with mild occasional fecal incontinence, small squirts of BM with urination.  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  Memory loss, progressive.  Occasional cough.  Some LE edema.  Some tingling intermittently.  Weight loss - 170-210 usually, 148 today..  Anorexia.  Early satiety.  H/o colon polyps. Last C-scope was last year.  Last report was in 2014.  No h/o EGD.   Renal US in 7/42 - uncertain reason.    There was a lot of discussion about the patient's alcohol intake.  Initially, the patient reported very intermittent use.  However, she lives with her alcoholic son and appears to be drinking regularly, up to 3-5 drinks/day.   ED Course: Prednisone 40 mg PO x 1; Dilaudid 0.75 mg IV x 1; MagOx 400 mg PO x 1; NS with 20 mEq/L x 500 cc; transfusion 2 units prbc  Review of Systems: As per HPI; otherwise 10 point review of systems reviewed and negative.   Ambulatory Status:  Ambulated with a cane prior to symptoms  Past Medical History:    Diagnosis Date  . Diabetes mellitus   . Glaucoma   . Gout   . Hyperlipidemia   . Hypertension   . Left foot pain     Past Surgical History:  Procedure Laterality Date  . APPENDECTOMY  1948  . CERVICAL SPINE SURGERY     fusion of L5-6  . EYE SURGERY     bleeding and glaucoma  . [place]    Social History   Social History  . Marital status: Widowed    Spouse name: N/A  . Number of children: N/A  . Years of education: N/A   Occupational History  . retired -Training and development officer, factory    Social History Main Topics  . Smoking status: Never Smoker  . Smokeless tobacco: Never Used  . Alcohol use Yes     Comment: occasional drink  . Drug use: No  . Sexual activity: Not on file   Other Topics Concern  . Not on file   Social History Narrative  . No narrative on file    No Known Allergies  Family History  Problem Relation Age of Onset  . Alzheimer's disease Mother 55  . Colon cancer Neg Hx   . Stomach cancer Neg Hx     Prior to Admission medications   Medication Sig Start Date End Date Taking? Authorizing Provider  amLODipine (  NORVASC) 5 MG tablet Take 1 tablet (5 mg total) by mouth daily. 05/12/15  Yes [name] Hassell Done, FNP  atorvastatin (LIPITOR) 40 MG tablet Take 1 tablet (40 mg total) by mouth daily. 08/25/15  Yes [name] Hassell Done, FNP  brimonidine-timolol (COMBIGAN) 0.2-0.5 % ophthalmic solution Place 1 drop into both eyes every 12 (twelve) hours.   Yes Historical Provider, MD  fenofibrate (TRICOR) 145 MG tablet Take 1 tablet (145 mg total) by mouth daily. 05/12/15  Yes [name] Hassell Done, FNP  hydrochlorothiazide (HYDRODIURIL) 25 MG tablet Take 1 tablet (25 mg total) by mouth daily. 05/12/15  Yes [name] Hassell Done, FNP  HYDROcodone-acetaminophen (NORCO/VICODIN) 5-325 MG tablet 1 tab PO q12 hours prn pain 02/15/16  Yes Francine Graven, DO  latanoprost (XALATAN) 0.005 % ophthalmic solution Place 1 drop into both eyes at bedtime.   Yes  Historical Provider, MD  losartan (COZAAR) 100 MG tablet Take 1 tablet (100 mg total) by mouth daily. 01/13/16  Yes Chipper Herb, MD  metFORMIN (GLUCOPHAGE) 1000 MG tablet Take 1 tablet (1,000 mg total) by mouth 2 (two) times daily with a meal. 01/13/16  Yes Chipper Herb, MD  nebivolol (BYSTOLIC) 10 MG tablet Take 2 tablets (20 mg total) by mouth daily. 05/12/15  Yes [name] Hassell Done, FNP  indomethacin (INDOCIN) 25 MG capsule Take 1 capsule (25 mg total) by mouth 3 (three) times daily with meals. For the next 5 days 02/15/16   Francine Graven, DO  naproxen (NAPROSYN) 500 MG tablet Take 1 tablet (500 mg total) by mouth 2 (two) times daily. 03/20/15   Milton Ferguson, MD    Physical Exam: Vitals:   02/23/16 1530 02/23/16 1615 02/23/16 1805 02/23/16 1829  BP: 169/93 181/84 (!) 181/91 (!) 165/78  Pulse:  88 88 92  Resp: 15 16 17 18   Temp:   98.4 F (36.9 C) 98.3 F (36.8 C)  TempSrc:   Oral Oral  SpO2:  95% 97% 98%  Weight:   71.3 kg (157 lb 3 oz)   Height:   5' 2"  (1.575 m)      General:  Appears calm and comfortable and is NAD, does have some apparent memory issues Eyes: L pupil reactive, R puil is less so; EOMI ENT:  grossly normal hearing, lips & tongue, mmm Neck:  no LAD, masses or thyromegaly Cardiovascular:  RRR, no m/r/g. No LE edema.  Respiratory:  CTA bilaterally, no w/r/r. Normal respiratory effort. Abdomen:  soft, ntnd, NABS Skin:  no rash or induration seen on limited exam Musculoskeletal: grossly normal tone BUE/BLE, good ROM, no bony abnormality Psychiatric:  grossly normal mood and affect, speech fluent and appropriate, AOx3 but some recall difficulty Neurologic:  CN 2-12 grossly intact, moves all extremities in coordinated fashion, sensation intact  Labs on Admission: I have personally reviewed following labs and imaging studies  CBC:  Recent Labs Lab 02/23/16 1208  WBC 11.6*  NEUTROABS 9.7*  HGB 5.7*  HCT 18.0*  MCV 90.0  PLT 818   Basic Metabolic  Panel:  Recent Labs Lab 02/23/16 1208  NA 141  K 2.8*  CL 99*  CO2 29  GLUCOSE 170*  BUN 37*  CREATININE 2.43*  CALCIUM 7.2*   GFR: Estimated Creatinine Clearance: 16.8 mL/min (by C-G formula based on SCr of 2.43 mg/dL (H)). Liver Function Tests:  Recent Labs Lab 02/23/16 1208  AST 18  ALT 9*  ALKPHOS 63  BILITOT 0.6  PROT 7.5  ALBUMIN 2.6*   No results for input(s): LIPASE, AMYLASE in the  last 168 hours. No results for input(s): AMMONIA in the last 168 hours. Coagulation Profile:  Recent Labs Lab 02/23/16 1208  INR 1.26   Cardiac Enzymes: No results for input(s): CKTOTAL, CKMB, CKMBINDEX, TROPONINI in the last 168 hours. BNP (last 3 results) No results for input(s): PROBNP in the last 8760 hours. HbA1C: No results for input(s): HGBA1C in the last 72 hours. CBG: No results for input(s): GLUCAP in the last 168 hours. Lipid Profile: No results for input(s): CHOL, HDL, LDLCALC, TRIG, CHOLHDL, LDLDIRECT in the last 72 hours. Thyroid Function Tests: No results for input(s): TSH, T4TOTAL, FREET4, T3FREE, THYROIDAB in the last 72 hours. Anemia Panel: No results for input(s): VITAMINB12, FOLATE, FERRITIN, TIBC, IRON, RETICCTPCT in the last 72 hours. Urine analysis:    Component Value Date/Time   BILIRUBINUR neg 04/24/2013 0933   PROTEINUR 1+ 04/24/2013 0933   UROBILINOGEN negative 04/24/2013 0933   NITRITE neg 04/24/2013 0933   LEUKOCYTESUR Negative 04/24/2013 0933    Creatinine Clearance: Estimated Creatinine Clearance: 16.8 mL/min (by C-G formula based on SCr of 2.43 mg/dL (H)).  Sepsis Labs: @LABRCNTIP (procalcitonin:4,lacticidven:4) )No results found for this or any previous visit (from the past 240 hour(s)).   Radiological Exams on Admission: No results found.  EKG: Independently reviewed. Sinus tachycardia with rate 95; nonspecific ST changes with no evidence of acute ischemia  Assessment/Plan Principal Problem:   Anemia Active Problems:    Hypertension   Diabetes ([place])   Hyperlipidemia   Hypokalemia   Protein-calorie malnutrition ([place])   Acute kidney injury superimposed on chronic kidney disease (HCC)   Alcohol abuse   Incontinence   Anemia -Patient with fatigue and recent worsening neuropathy, possibly symptomatic anemia -Hgb in 10/14 was 12.9, 8.8 in 12/16, currently 5.7 -Guaiac negative -Appears to be a normocytic anemia, possibly chronic disease -Discussed various possible causes of anemia including malignancy; other than recent fatigue and unintentional weight loss due to anorexia and possibly early satiety, the patient has no clear symptoms that would readily lend to imaging for diagnosis -Will check B12 and folate levels, although alcohol-related anemia is often megaloblastic rather than normocytic -BUN is elevated, which amy be associated with creatinine.  However, it does appear that the patient may have been taking indocin, possibly for gout.  This is nephrotoxic and GI toxic moreso than other NSAIDs.  She is heme negative, but upper GI bleed may need consideration, particularly if stools turn dark. -Will order abdominal US (see below) and L-spine imaging (see below).  Otherwise, no specific work up except for TSH, ESR for nonspecific anemia evaluation. -Transfused 2 units PRBC by ER physician; will order f/u CBC  AKI on CKD -Baseline creatinine appears to be about 1.2 -Current creatinine is 2.43 -Again, may be related to ETOH abuse and lack of other hydration with poor PO intake and/or to indomethacin. -Will trend after transfusion and gentle IVF hydration -Abdominal US ordered for further evaluation -Hold HCTZ, Cozaar, and Glucophage.  Hypokalemia -Denies n/v -Unusual in the setting of AKI/CKD -Will check mag level.  -Received 10 mEq IV (as well as mag ox) in ER.  This is likely insufficient replacement. -Will give an additional 40 mEq PO x 1 now.  ETOH abuse -CIWA protocol -Check folate and B12 and  replete accordingly.  Incontinence -Patient with urge urinary incontinence as well as fecal incontinence -Also with neuropathy which is likely related to DM but has been fairly acute in its presentation and concordant with incontinence -Has h/o C-spine fusion, but this is  likely to be a lower problem -Denies back pain -Will start with lumbar spine x-rays and consider more intensive imaging pending results  Malnutrition -Low albumin -Family reports anorexia, early satiety, unintentional weight loss -Will order nutrition consult  DM -Hyperglycemia at most ER visits but last A1c in 2/17 was 6.3, will recheck. -This is basically at goal for this patient at this time. -Hold PO meds and cover with SSI.  HTN -Suboptimal control in ER -Continue CCB, BB; hold ARB and HCTZ -Will cover with hydralazine IV prn.  HLD -Continue Lipitor.   DVT prophylaxis: SCDs Code Status: DNR - confirmed with patient/family Family Communication: Daughter present at bedside throughout evalaution Disposition Plan:  Home once clinically improved Consults called: None Admission status: Admit - It is my clinical opinion that admission to INPATIENT is reasonable and necessary because this patient will require at least 2 midnights in the hospital to treat this condition based on the medical complexity of the problems presented.  Given the aforementioned information, the predictability of an adverse outcome is felt to be significant.    [name] MD [facility]  If 7PM-7AM, please contact night-coverage [email or web address] Password [facility]  02/23/2016, 7:29 PM

## 2016-02-23 NOTE — ED Notes (Signed)
Pt has bm, pt cleaned by Melvyn Neth, NT and she stated was no blood or black stool noted.

## 2016-02-24 ENCOUNTER — Encounter (HOSPITAL_COMMUNITY): Payer: Self-pay | Admitting: Gastroenterology

## 2016-02-24 ENCOUNTER — Inpatient Hospital Stay (HOSPITAL_COMMUNITY): Payer: Medicare Other

## 2016-02-24 DIAGNOSIS — D649 Anemia, unspecified: Principal | ICD-10-CM

## 2016-02-24 LAB — URINALYSIS, ROUTINE W REFLEX MICROSCOPIC
Bilirubin Urine: NEGATIVE
Glucose, UA: NEGATIVE mg/dL
Hgb urine dipstick: NEGATIVE
KETONES UR: NEGATIVE mg/dL
NITRITE: NEGATIVE
PH: 5.5 (ref 5.0–8.0)
Specific Gravity, Urine: 1.015 (ref 1.005–1.030)

## 2016-02-24 LAB — GLUCOSE, CAPILLARY
GLUCOSE-CAPILLARY: 268 mg/dL — AB (ref 65–99)
Glucose-Capillary: 142 mg/dL — ABNORMAL HIGH (ref 65–99)
Glucose-Capillary: 181 mg/dL — ABNORMAL HIGH (ref 65–99)
Glucose-Capillary: 188 mg/dL — ABNORMAL HIGH (ref 65–99)

## 2016-02-24 LAB — HEMOGLOBIN AND HEMATOCRIT, BLOOD
HEMATOCRIT: 25.6 % — AB (ref 36.0–46.0)
HEMOGLOBIN: 8.5 g/dL — AB (ref 12.0–15.0)

## 2016-02-24 LAB — CBC
HCT: 22.6 % — ABNORMAL LOW (ref 36.0–46.0)
Hemoglobin: 7.5 g/dL — ABNORMAL LOW (ref 12.0–15.0)
MCH: 28.8 pg (ref 26.0–34.0)
MCHC: 33.2 g/dL (ref 30.0–36.0)
MCV: 86.9 fL (ref 78.0–100.0)
PLATELETS: 324 10*3/uL (ref 150–400)
RBC: 2.6 MIL/uL — ABNORMAL LOW (ref 3.87–5.11)
RDW: 15.6 % — AB (ref 11.5–15.5)
WBC: 11.6 10*3/uL — AB (ref 4.0–10.5)

## 2016-02-24 LAB — SEDIMENTATION RATE: Sed Rate: 135 mm/hr — ABNORMAL HIGH (ref 0–22)

## 2016-02-24 LAB — URINE MICROSCOPIC-ADD ON: RBC / HPF: NONE SEEN RBC/hpf (ref 0–5)

## 2016-02-24 LAB — BASIC METABOLIC PANEL
Anion gap: 9 (ref 5–15)
BUN: 40 mg/dL — AB (ref 6–20)
CHLORIDE: 104 mmol/L (ref 101–111)
CO2: 28 mmol/L (ref 22–32)
CREATININE: 2.1 mg/dL — AB (ref 0.44–1.00)
Calcium: 6.7 mg/dL — ABNORMAL LOW (ref 8.9–10.3)
GFR calc Af Amer: 24 mL/min — ABNORMAL LOW (ref >60)
GFR calc non Af Amer: 21 mL/min — ABNORMAL LOW (ref >60)
Glucose, Bld: 286 mg/dL — ABNORMAL HIGH (ref 65–99)
Potassium: 2.6 mmol/L — CL (ref 3.5–5.1)
SODIUM: 141 mmol/L (ref 135–145)

## 2016-02-24 LAB — VITAMIN B12: Vitamin B-12: 463 pg/mL (ref 180–914)

## 2016-02-24 LAB — GAMMA GT: GGT: 23 U/L (ref 7–50)

## 2016-02-24 LAB — PREPARE RBC (CROSSMATCH)

## 2016-02-24 LAB — MAGNESIUM: MAGNESIUM: 1.2 mg/dL — AB (ref 1.7–2.4)

## 2016-02-24 MED ORDER — POTASSIUM CHLORIDE CRYS ER 20 MEQ PO TBCR
30.0000 meq | EXTENDED_RELEASE_TABLET | Freq: Two times a day (BID) | ORAL | Status: AC
  Administered 2016-02-24 (×2): 30 meq via ORAL
  Filled 2016-02-24 (×2): qty 1

## 2016-02-24 MED ORDER — MAGNESIUM SULFATE 50 % IJ SOLN: 3.0000 g | Freq: Once | INTRAMUSCULAR | Status: DC

## 2016-02-24 MED ORDER — POTASSIUM CHLORIDE IN NACL 20-0.9 MEQ/L-% IV SOLN
INTRAVENOUS | Status: DC
Start: 2016-02-24 — End: 2016-02-25
  Administered 2016-02-24: 09:00:00 via INTRAVENOUS

## 2016-02-24 MED ORDER — INSULIN ASPART 100 UNIT/ML ~~LOC~~ SOLN
0.0000 [IU] | Freq: Three times a day (TID) | SUBCUTANEOUS | Status: DC
  Administered 2016-02-24: 4 [IU] via SUBCUTANEOUS
  Administered 2016-02-26: 7 [IU] via SUBCUTANEOUS

## 2016-02-24 MED ORDER — AMLODIPINE BESYLATE 5 MG PO TABS
10.0000 mg | ORAL_TABLET | Freq: Every day | ORAL | Status: DC
  Administered 2016-02-25 – 2016-02-26 (×2): 10 mg via ORAL
  Filled 2016-02-24 (×2): qty 2

## 2016-02-24 MED ORDER — POTASSIUM CHLORIDE CRYS ER 20 MEQ PO TBCR: 40.0000 meq | EXTENDED_RELEASE_TABLET | Freq: Two times a day (BID) | ORAL | Status: DC

## 2016-02-24 MED ORDER — MAGNESIUM SULFATE 50 % IJ SOLN
3.0000 g | Freq: Once | INTRAVENOUS | Status: AC
  Administered 2016-02-24: 3 g via INTRAVENOUS
  Filled 2016-02-24: qty 6

## 2016-02-24 MED ORDER — INSULIN GLARGINE 100 UNIT/ML ~~LOC~~ SOLN
20.0000 [IU] | Freq: Every day | SUBCUTANEOUS | Status: DC
  Administered 2016-02-24 – 2016-02-25 (×2): 20 [IU] via SUBCUTANEOUS
  Filled 2016-02-24 (×3): qty 0.2

## 2016-02-24 MED ORDER — DICLOFENAC SODIUM 1 % TD GEL
4.0000 g | Freq: Two times a day (BID) | TRANSDERMAL | Status: DC
  Administered 2016-02-24 – 2016-02-26 (×5): 4 g via TOPICAL
  Filled 2016-02-24: qty 100

## 2016-02-24 MED ORDER — INSULIN ASPART 100 UNIT/ML ~~LOC~~ SOLN: 0.0000 [IU] | Freq: Every day | SUBCUTANEOUS | Status: DC

## 2016-02-24 MED ORDER — PANTOPRAZOLE SODIUM 40 MG PO TBEC
40.0000 mg | DELAYED_RELEASE_TABLET | Freq: Every day | ORAL | Status: DC
  Administered 2016-02-24 – 2016-02-26 (×3): 40 mg via ORAL
  Filled 2016-02-24 (×2): qty 1

## 2016-02-24 MED ORDER — PANTOPRAZOLE SODIUM 40 MG PO TBEC: 40.0000 mg | DELAYED_RELEASE_TABLET | Freq: Every day | ORAL | Status: DC

## 2016-02-24 MED ORDER — FUROSEMIDE 10 MG/ML IJ SOLN
10.0000 mg | Freq: Once | INTRAMUSCULAR | Status: AC
  Administered 2016-02-24: 10 mg via INTRAVENOUS
  Filled 2016-02-24: qty 2

## 2016-02-24 MED ORDER — GLUCERNA SHAKE PO LIQD
237.0000 mL | Freq: Two times a day (BID) | ORAL | Status: DC
  Administered 2016-02-24 – 2016-02-26 (×3): 237 mL via ORAL

## 2016-02-24 MED ORDER — SODIUM CHLORIDE 0.9 % IV SOLN
Freq: Once | INTRAVENOUS | Status: AC
  Administered 2016-02-24: 11:00:00 via INTRAVENOUS

## 2016-02-24 MED ORDER — GABAPENTIN 100 MG PO CAPS
100.0000 mg | ORAL_CAPSULE | Freq: Every day | ORAL | Status: DC
  Administered 2016-02-24 – 2016-02-25 (×2): 100 mg via ORAL
  Filled 2016-02-24 (×2): qty 1

## 2016-02-24 NOTE — Evaluation (Signed)
Physical Therapy Evaluation Patient Details Name: Jackie Hickman MRN: [id number] DOB: 26-Feb-1933 Today's Date: 02/24/2016   History of Present Illness  82 year old woman with a history of DM, HTN, gout, glaucoma, DJD, who presented to the ED on 02/23/2016 with a chief complaint of left leg and left foot pain. The patient denied any recent falls. She had been having some generalized weakness and mild fecal and urinary incontinence. She has had some poor appetite and early satiety. She denied bright red blood per rectum or black tarry stools. Her family reports that she drinks alcohol almost every day. In the ED, she was afebrile and hemodynamically stable. Her lab data were significant for a hemoglobin of 5.7, hematocrit of 18, MCV of 90, creatinine of 2.43, potassium of 2.8, and magnesium level of 1.2. She was admitted for further evaluation and management.  02/24/2016 Hgb: 7.5, Hct: 22.6 and pt ordered to have 1 more unit of PRBC's.      Clinical Impression  Pt received in bed, dtr present, and pt is agreeable to PT evaluation.  Pt expressed that she normally just carries her cane with her when she is ambulating, and she is independent with ADL's, including driving and running errands.  States the last time she was at the grocery store was ~1wk ago.  During today's PT evaluation, pt is c/o neck pain.  She required Min A for bed mobility, Min A for transfers, and Min A for ambulation x 40ft with RW.  During gait she fatigued quickly, and required a w/c to be brought up behind her.  Throughout eval she had some difficulty following commands, and was disoriented to situation and date.  Therefore, she is recommended for SNF due to need for 24/7 supervision assistance.      Follow Up Recommendations SNF    Equipment Recommendations  None recommended by PT    Recommendations for Other Services       Precautions / Restrictions Precautions Precautions: Fall Precaution Comments: Due to recent  immobility.  Restrictions Weight Bearing Restrictions: No      Mobility  Bed Mobility Overal bed mobility: Needs Assistance Bed Mobility: Sidelying to Sit   Sidelying to sit: Min assist (to lift trunk up )          Transfers Overall transfer level: Needs assistance Equipment used: Rolling walker (2 wheeled) Transfers: Sit to/from Stand Sit to Stand: Min assist (vc's for hand placement)            Ambulation/Gait Ambulation/Gait assistance: Min assist Ambulation Distance (Feet): 50 Feet Assistive device: Rolling walker (2 wheeled) Gait Pattern/deviations: Wide base of support;Trunk flexed   Gait velocity interpretation: <1.8 ft/sec, indicative of risk for recurrent falls General Gait Details: Pt fatigued quickly.   Even after PT had expressed to the pt that she needed to be able to make it back to the room, pt continue to ambulate.  PT had pt turn around due to noticeable increased in respiratory rate.  Pt was not able to make it back to the room.  PT had pt rest with back against the wall, and Lauren, RN brought a w/c.  Pt was assisted into the w/c and wheeled back to the room.     Stairs            Wheelchair Mobility    Modified Rankin (Stroke Patients Only)       Balance Overall balance assessment: Needs assistance Sitting-balance support: No upper extremity supported;Feet supported Sitting balance-Leahy Scale: Good  Standing balance support: Bilateral upper extremity supported Standing balance-Leahy Scale: Poor                               Pertinent Vitals/Pain Pain Assessment: 0-10 Pain Location: neck - pain when moving.  Pain Descriptors / Indicators: Sharp Pain Intervention(s): Limited activity within patient's tolerance;Monitored during session;Repositioned    Home Living   Living Arrangements: Alone (pt states that her son lives with her.  ) Available Help at Discharge: Available PRN/intermittently (pt states there is  someone there practically every day.  ) Type of Home: House Home Access: Stairs to enter   [employer] of Steps: 2 on the porch with B railing.  Home Layout: One level Home Equipment: Cane - single point      Prior Function Level of Independence: Independent with assistive device(s)   Gait / Transfers Assistance Needed: Pt states that she carries her cane with her just in case.    ADL's / Homemaking Assistance Needed: independent with dressing/bathing, driving, community ambulator - states she gets lazy on the job though.          Hand Dominance   Dominant Hand: Left    Extremity/Trunk Assessment   Upper Extremity Assessment: Generalized weakness           Lower Extremity Assessment: Generalized weakness         Communication      Cognition Arousal/Alertness: Awake/alert Behavior During Therapy: WFL for tasks assessed/performed Overall Cognitive Status: Impaired/Different from baseline Area of Impairment: Orientation;Following commands Orientation Level: Situation;Disoriented to;Time ("October" 2025)     Following Commands: Follows one step commands inconsistently (Pt instructed to scoot fwd on the EOB, and instead she scooted sideways even after command was repeated.  Pt also did not comprehend that she needed to be able to ambulate back to the room.  )            General Comments      Exercises     Assessment/Plan    PT Assessment Patient needs continued PT services  PT Problem List Decreased strength;Decreased activity tolerance;Decreased balance;Decreased mobility;Decreased cognition;Decreased knowledge of use of DME;Decreased safety awareness;Decreased knowledge of precautions;Cardiopulmonary status limiting activity;Pain;Obesity          PT Treatment Interventions DME instruction;Functional mobility training;Therapeutic activities;Balance training;Therapeutic exercise;Cognitive remediation;Patient/family education;Gait training    PT  Goals (Current goals can be found in the Care Plan section)  Acute Rehab PT Goals Patient Stated Goal: Pt wants to get stronger PT Goal Formulation: With patient/family Time For Goal Achievement: 03/02/16 Potential to Achieve Goals: Good    Frequency Min 3X/week   Barriers to discharge Decreased caregiver support Pt is alone during the day.     Co-evaluation               End of Session Equipment Utilized During Treatment: Gait belt Activity Tolerance: Patient limited by fatigue Patient left: in chair;with call bell/phone within reach;with nursing/sitter in room Nurse Communication: Mobility status Ander Purpura, RN aware of pt's mobility status. )    Functional Assessment Tool Used: [employer] "6-clicks"  Functional Limitation: Mobility: Walking and moving around Mobility: Walking and Moving Around Current Status [phone]): At least 40 percent but less than 60 percent impaired, limited or restricted Mobility: Walking and Moving Around Goal Status [phone]): At least 20 percent but less than 40 percent impaired, limited or restricted    Time: [id number] PT Time Calculation (min) (ACUTE  ONLY): 34 min   Charges:   PT Evaluation $PT Eval Low Complexity: 1 Procedure PT Treatments $Gait Training: 8-22 mins   PT G Codes:   PT G-Codes **NOT FOR INPATIENT CLASS** Functional Assessment Tool Used: [employer] "6-clicks"  Functional Limitation: Mobility: Walking and moving around Mobility: Walking and Moving Around Current Status [phone]): At least 40 percent but less than 60 percent impaired, limited or restricted Mobility: Walking and Moving Around Goal Status [phone]): At least 20 percent but less than 40 percent impaired, limited or restricted    Beth Naasir Carreira, PT, DPT X: [phone]

## 2016-02-24 NOTE — Progress Notes (Signed)
This RN spoke to daughter, Lorna Dibble [phone], on telephone earlier. Daughter thinks that patient needs more care than what she is able to get at home. Patient may need facility placement. Per daughter, patient has had more confusion lately. Order for Case Management entered.

## 2016-02-24 NOTE — Progress Notes (Addendum)
PROGRESS NOTE    Jackie Hickman  [id number] DOB: October 27, 1932 DOA: 02/23/2016 PCP: [name], FNP    Brief Narrative:  Patient is an 82 year old woman with a history of DM, HTN, gout, glaucoma, DJD, who presented to the ED on 02/23/2016 with a chief complaint of left leg and left foot pain. The patient denied any recent falls. She had been having some generalized weakness and mild fecal and urinary incontinence. She has had some poor appetite and early satiety. She denied bright red blood per rectum or black tarry stools. Her family reports that she drinks alcohol almost every day. In the ED, she was afebrile and hemodynamically stable. Her lab data were significant for a hemoglobin of 5.7, hematocrit of 18, MCV of 90, creatinine of 2.43, potassium of 2.8, and magnesium level of 1.2. She was admitted for further evaluation and management.   Assessment & Plan:   Principal Problem:   Normocytic anemia Active Problems:   Hypokalemia   Acute kidney injury superimposed on chronic kidney disease (HCC)   Alcohol abuse   Hypomagnesemia   Essential hypertension   Type 2 diabetes mellitus with renal manifestations (HCC)   Hyperlipidemia   Protein-calorie malnutrition (HCC)   Incontinence   1. Severe normocytic anemia. The patient's hemoglobin was 5.7 on admission. Apparently her stool was guaiac negative in the ED. Her hemoglobin 03/2015 was 8.8. The etiology of her anemia is unclear. -She was transfused 2 units of packed red blood cells following admission. Her hemoglobin improved appropriately. -We'll transfuse another unit for goal of 8.0 g or better. -GI was consulted. The tentative plan is for a colonoscopy and/or EGD in the near future. -Protonix was added daily. Naproxen was discontinued. -Vital B12 level and folate RBC pending.  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 Patient's creatinine was 2.43 on admission. Her baseline creatinine  appears to be around 1.2. -She was started on gentle IV fluids for hydration. HCTZ and Cozaar were withheld. -Her creatinine is improving, but not back at baseline. Will continue IV fluids.  Essential hypertension. Patient is treated chronically with amlodipine, bystolic. HCTZ, and losartan. HCTZ and losartan are being held due to AKI. Her blood pressure was uncontrolled. -Continue bystolic and Norvasc, but will increase the dose to 10 mg daily. We'll continue necessary hydralazine IV.  Hypokalemia and hypomagnesemia. Patient's serum potassium was 2.8 and her magnesium was 1.2 on admission.  -We'll continue  potassium chloride supplementation IV and give 3 g g of magnesium sulfate. -We'll continue to monitor and continue repletion/supplementation as needed.  Type 2 diabetes mellitus. Patient is treated chronically with metformin. It is being held in the setting of renal dysfunction. Sliding scale NovoLog was started. -We'll change sliding scale NovoLog to resistant scale and add Lantus. -Hemoglobin A1c ordered and is pending.  Alcohol abuse.  Patient admits to drinking about a private of gin weekly. She was strongly advised to stop drinking indefinitely. CIWA protocol was started. She shows no signs of alcohol withdrawal syndrome.  Foot pain/probable peripheral neuropathy/neck pain.  Patient has a history of gout, but likely has DJD. Apparently, she was treated with NSAIDs. -We'll order a cervical spine x-ray for evaluation. -We'll start gabapentin daily at bedtime, topical Voltaren for her neck pain, and continue when necessary Vicodin. -PT consult ordered.       DVT prophylaxis: SCDs Code Status: Full code Family Communication: Discussed with the daughter Disposition Plan: Discharge when clinically appropriate   Consultants:   Gastroenterology  Procedures:  None  Antimicrobials:  None    Subjective: Patient complains of right-sided neck pain. She denies  weakness in her arms or legs. She reports long-standing numbness and tingling in her feet. The pain is not as bad as when she first came in.  Objective: Vitals:   02/23/16 1829 02/23/16 2052 02/24/16 0107 02/24/16 0445  BP: (!) 165/78 (!) 188/85 (!) 155/82 (!) 172/98  Pulse: 92 86 80 85  Resp: 18 18  16   Temp: 98.3 F (36.8 C) 97.8 F (36.6 C)  97.4 F (36.3 C)  TempSrc: Oral Oral  Oral  SpO2: 98% 100%  100%  Weight:      Height:        Intake/Output Summary (Last 24 hours) at 02/24/16 1031 Last data filed at 02/24/16 0700  Gross per 24 hour  Intake          2828.83 ml  Output                0 ml  Net          2828.83 ml   Filed Weights   02/23/16 1138 02/23/16 1805  Weight: 67.1 kg (148 lb) 71.3 kg (157 lb 3 oz)    Examination:  General exam: Pleasant obese 82 year old African-American woman in no acute distress.  Respiratory system: Clear to auscultation. Respiratory effort normal. Cardiovascular system: S1 & S2 with a 1 to 2/6 systolic murmur. No pedal edema. Gastrointestinal system: Abdomen is obese, nondistended, soft and nontender. No organomegaly or masses felt. Normal bowel sounds heard. Central nervous system: Alert and oriented to herself and hospital. Her speech is clear. No focal neurological deficits. Musculoskeletal: She has some mild-moderate tenderness over her paracervical spine muscles and right trapezius and shoulder without erythema or fluctuance or warmth. Good range of motion of her neck. Skin: No rashes, lesions or ulcers Psychiatry: Judgement and insight appear normal. Mood & affect appropriate.     Data Reviewed: I have personally reviewed following labs and imaging studies  CBC:  Recent Labs Lab 02/23/16 1208 02/24/16 0542  WBC 11.6* 11.6*  NEUTROABS 9.7*  --   HGB 5.7* 7.5*  HCT 18.0* 22.6*  MCV 90.0 86.9  PLT 340 0000000   Basic Metabolic Panel:  Recent Labs Lab 02/23/16 1208 02/24/16 0542 02/24/16 0600  NA 141 141  --   K 2.8*  2.6*  --   CL 99* 104  --   CO2 29 28  --   GLUCOSE 170* 286*  --   BUN 37* 40*  --   CREATININE 2.43* 2.10*  --   CALCIUM 7.2* 6.7*  --   MG 1.2*  --  1.2*   GFR: Estimated Creatinine Clearance: 19.4 mL/min (by C-G formula based on SCr of 2.1 mg/dL (H)). Liver Function Tests:  Recent Labs Lab 02/23/16 1208  AST 18  ALT 9*  ALKPHOS 63  BILITOT 0.6  PROT 7.5  ALBUMIN 2.6*   No results for input(s): LIPASE, AMYLASE in the last 168 hours. No results for input(s): AMMONIA in the last 168 hours. Coagulation Profile:  Recent Labs Lab 02/23/16 1208  INR 1.26   Cardiac Enzymes: No results for input(s): CKTOTAL, CKMB, CKMBINDEX, TROPONINI in the last 168 hours. BNP (last 3 results) No results for input(s): PROBNP in the last 8760 hours. HbA1C: No results for input(s): HGBA1C in the last 72 hours. CBG:  Recent Labs Lab 02/23/16 2056 02/24/16 0847  GLUCAP 287* 268*   Lipid Profile: No  results for input(s): CHOL, HDL, LDLCALC, TRIG, CHOLHDL, LDLDIRECT in the last 72 hours. Thyroid Function Tests: No results for input(s): TSH, T4TOTAL, FREET4, T3FREE, THYROIDAB in the last 72 hours. Anemia Panel: No results for input(s): VITAMINB12, FOLATE, FERRITIN, TIBC, IRON, RETICCTPCT in the last 72 hours. Sepsis Labs: No results for input(s): PROCALCITON, LATICACIDVEN in the last 168 hours.  No results found for this or any previous visit (from the past 240 hour(s)).       Radiology Studies: Dg Lumbar Spine 2-3 Views  Result Date: 02/24/2016 CLINICAL DATA:  Right lower leg pain EXAM: LUMBAR SPINE - 2-3 VIEW COMPARISON:  None available FINDINGS: Bones are osteopenic. Normal alignment. Preserved vertebral body heights. Diffuse lower thoracic and lumbar degenerative spondylosis at all levels with disc space narrowing, sclerosis and endplate osteophytes. Lower lumbar facet arthropathy at L5-S1. Transitional S1 segment noted. Pedicles appear intact. Normal SI joints for age.  Aortoiliac atherosclerosis noted. Bowel gas pattern is normal.  Remote cholecystectomy evident. IMPRESSION: Diffuse thoracolumbar degenerative spondylosis without definite acute osseous finding or malalignment by plain radiography. Aortoiliac atherosclerosis Electronically Signed   By: Jerilynn Mages.  Shick M.D.   On: 02/24/2016 08:17   US Abdomen Complete  Result Date: 02/24/2016 CLINICAL DATA:  Nonspecific pain and incontinence EXAM: ABDOMEN ULTRASOUND COMPLETE COMPARISON:  None. FINDINGS: Gallbladder: Not visualized and suspected absent. Common bile duct: Diameter: 5 mm. There is no intrahepatic, common hepatic, or common bile duct dilatation. Liver: No focal lesion identified. Within normal limits in parenchymal echogenicity. IVC: No abnormality visualized. Pancreas: No mass or inflammatory focus. Spleen: Size and appearance within normal limits. Right Kidney: Length: 9.4 cm. Echogenicity within normal limits. No mass or hydronephrosis visualized. Left Kidney: Length: 9.7 cm. Echogenicity within normal limits. No mass or hydronephrosis visualized. Abdominal aorta: No aneurysm visualized. Other findings: No demonstrable ascites. IMPRESSION: Gallbladder not visualized and presumed absent. Study otherwise unremarkable. Electronically Signed   By: Lowella Grip III M.D.   On: 02/24/2016 08:42        Scheduled Meds: . amLODipine  5 mg Oral Daily  . atorvastatin  40 mg Oral q1800  . brimonidine  1 drop Both Eyes Q12H   And  . timolol  1 drop Both Eyes Q12H  . fenofibrate  160 mg Oral Daily  . folic acid  1 mg Oral Daily  . insulin aspart  0-15 Units Subcutaneous TID WC  . latanoprost  1 drop Both Eyes QHS  . magnesium sulfate 1 - 4 g bolus IVPB  3 g Intravenous Once  . multivitamin with minerals  1 tablet Oral Daily  . nebivolol  20 mg Oral Daily  . potassium chloride  30 mEq Oral BID  . sodium chloride flush  3 mL Intravenous Q12H  . thiamine  100 mg Oral Daily   Or  . thiamine  100 mg  Intravenous Daily   Continuous Infusions: . 0.9 % NaCl with KCl 20 mEq / L 75 mL/hr at 02/24/16 0836  . lactated ringers 50 mL/hr at 02/23/16 2359     LOS: 1 day    Time spent: 72 minutes    Rexene Alberts, MD [facility] Pager [phone]  If 7PM-7AM, please contact night-coverage [email or web address] Password [email or web address] 02/24/2016, 10:31 AM

## 2016-02-24 NOTE — Consult Note (Signed)
Referring Provider: Dr. Caryn Hickman  Primary Care Physician:  Jackie Pretty, FNP Primary Gastroenterologist:  Dr. Fuller Hickman, last saw in 2014   Date of Admission: 02/23/16 Date of Consultation: 02/24/16  Reason for Consultation: Severe anemia   HPI:  Jackie Hickman is an 82 y.o. year old very pleasant female who presented to the ED and found to have severe anemia with Hgb 5.7. Heme negative. Hgb 11 months ago was 8.8 at time of ED presentation for gout. No repeat Hgb since that time.  Prior to this several years ago was 12.9. She is pleasantly confused, stating she is unsure why her family brought her to the ED. I spoke with Jackie Hickman, her daughter, at the bedside. According to daughter, she has noted patient's baseline of mild confusion worsening lately. She notes a steady decline over the past year but worsening over the past month. Decreased appetite for the past month, worsening weakness, fecal incontinence noted occasionally. Sleeping more than normal, no energy. No overt GI bleeding. No aspirin powders but possibly has been taking Ibuprofen. Indomethacin prescribed by ED Nov 12th, 2017, for a short course. Notes joint pain, right knee pain. Daughter is unsure how much alcohol patient is drinking. Patient notes she drinks just socially. Her son comes and goes from her house, and he is a "heavy drinker".    Received 2 units PRBCs with improvement in Hgb to 7.5. Denies any abdominal pain. States any pain she has had will be in right lower leg, joint pain. Denies any nausea, vomiting.   History of tubulovillous adenoma with high grade dysplasia in 2013 removed in piecemeal fashion, repeat colonoscopy in 2014 with tubulovillous adenoma s/p piecemeal removal, last colonoscopy Nov 2014 without any polyps and surveillance recommended in Nov 2016 (with more extensive bowel prep) due to history of multiple adenomatous polyps and history of high-grade dysplasia. EGD in remote past (2005) with gastritis.    Past Medical History:  Diagnosis Date  . Diabetes mellitus   . Glaucoma   . Gout   . Hyperlipidemia   . Hypertension   . Left foot pain     Past Surgical History:  Procedure Laterality Date  . APPENDECTOMY  1948  . BACK SURGERY     fusion of L5-6  . COLONOSCOPY  08/2011   Dr. Silvio Hickman: 6 mm sessile polyp in cecum (tubulovillous adenoma with HGD), 25 mm sessile polyp in ascending colon (Tubular adenoma). Piecemeal polypectomy   . COLONOSCOPY  08/2012   Dr. Fuller Hickman: multiple sessile polyps (6-12 mm), tubular adenoma and tubulovillous adenoma, piecemeal polypectomy   . COLONOSCOPY  02/2013   Dr. Fuller Hickman: post-polypectomy scar in ascending colon, otherwise normal   . ESOPHAGOGASTRODUODENOSCOPY  2005   Dr. Fuller Hickman: gastritis   . EYE SURGERY     bleeding and glaucoma  . [place]    Prior to Admission medications   Medication Sig Start Date End Date Taking? Authorizing Provider  amLODipine (NORVASC) 5 MG tablet Take 1 tablet (5 mg total) by mouth daily. 05/12/15  Yes Jackie Hassell Done, FNP  atorvastatin (LIPITOR) 40 MG tablet Take 1 tablet (40 mg total) by mouth daily. 08/25/15  Yes Jackie Hassell Done, FNP  brimonidine-timolol (COMBIGAN) 0.2-0.5 % ophthalmic solution Place 1 drop into both eyes every 12 (twelve) hours.   Yes Historical Provider, MD  fenofibrate (TRICOR) 145 MG tablet Take 1 tablet (145 mg total) by mouth daily. 05/12/15  Yes Jackie Hassell Done, FNP  hydrochlorothiazide (HYDRODIURIL) 25 MG tablet Take 1 tablet (25 mg  total) by mouth daily. 05/12/15  Yes Jackie Hassell Done, FNP  HYDROcodone-acetaminophen (NORCO/VICODIN) 5-325 MG tablet 1 tab PO q12 hours prn pain 02/15/16  Yes Jackie Graven, DO  latanoprost (XALATAN) 0.005 % ophthalmic solution Place 1 drop into both eyes at bedtime.   Yes Historical Provider, MD  losartan (COZAAR) 100 MG tablet Take 1 tablet (100 mg total) by mouth daily. 01/13/16  Yes Jackie Herb, MD  metFORMIN  (GLUCOPHAGE) 1000 MG tablet Take 1 tablet (1,000 mg total) by mouth 2 (two) times daily with a meal. 01/13/16  Yes Jackie Herb, MD  nebivolol (BYSTOLIC) 10 MG tablet Take 2 tablets (20 mg total) by mouth daily. 05/12/15  Yes Jackie Hassell Done, FNP  naproxen (NAPROSYN) 500 MG tablet Take 1 tablet (500 mg total) by mouth 2 (two) times daily. 03/20/15   Jackie Ferguson, MD    Current Facility-Administered Medications  Medication Dose Route Frequency Provider Last Rate Last Dose  . 0.9 % NaCl with KCl 20 mEq/ L  infusion   Intravenous Continuous Jackie Alberts, MD 75 mL/hr at 02/24/16 0836    . acetaminophen (TYLENOL) tablet 650 mg  650 mg Oral Q6H PRN Jackie Bongo, MD       Or  . acetaminophen (TYLENOL) suppository 650 mg  650 mg Rectal Q6H PRN Jackie Bongo, MD      . amLODipine (NORVASC) tablet 5 mg  5 mg Oral Daily Jackie Bongo, MD      . atorvastatin (LIPITOR) tablet 40 mg  40 mg Oral q1800 Jackie Bongo, MD   40 mg at 02/23/16 1900  . brimonidine (ALPHAGAN) 0.2 % ophthalmic solution 1 drop  1 drop Both Eyes Q12H Jackie Bongo, MD   1 drop at 02/24/16 0018   And  . timolol (TIMOPTIC) 0.5 % ophthalmic solution 1 drop  1 drop Both Eyes Q12H Jackie Bongo, MD   1 drop at 02/24/16 0021  . fenofibrate tablet 160 mg  160 mg Oral Daily Jackie Bongo, MD   160 mg at 02/23/16 1900  . folic acid (FOLVITE) tablet 1 mg  1 mg Oral Daily Jackie Bongo, MD   1 mg at 02/23/16 1901  . hydrALAZINE (APRESOLINE) injection 10 mg  10 mg Intravenous Q4H PRN Jackie Bongo, MD      . HYDROcodone-acetaminophen (NORCO/VICODIN) 5-325 MG per tablet 1 tablet  1 tablet Oral Q6H PRN Jackie Bongo, MD      . insulin aspart (novoLOG) injection 0-15 Units  0-15 Units Subcutaneous TID WC Jackie Bongo, MD   8 Units at 02/24/16 0855  . lactated ringers infusion   Intravenous Continuous Jackie Bongo, MD 50 mL/hr at 02/23/16 2359    . latanoprost (XALATAN) 0.005 % ophthalmic solution 1 drop  1 drop Both Eyes QHS  Jackie Bongo, MD   1 drop at 02/24/16 0019  . LORazepam (ATIVAN) tablet 1 mg  1 mg Oral Q6H PRN Jackie Bongo, MD       Or  . LORazepam (ATIVAN) injection 1 mg  1 mg Intravenous Q6H PRN Jackie Bongo, MD      . magnesium sulfate 3 g in dextrose 5 % 100 mL IVPB  3 g Intravenous Once Jackie Alberts, MD      . multivitamin with minerals tablet 1 tablet  1 tablet Oral Daily Jackie Bongo, MD   1 tablet at 02/23/16 1901  . nebivolol (BYSTOLIC) tablet 20 mg  20 mg Oral Daily Jackie Bongo, MD      . ondansetron [facility]) tablet 4  mg  4 mg Oral Q6H PRN Jackie Bongo, MD       Or  . ondansetron [facility]) injection 4 mg  4 mg Intravenous Q6H PRN Jackie Bongo, MD      . potassium chloride (K-DUR,KLOR-CON) CR tablet 30 mEq  30 mEq Oral BID Jackie Alberts, MD      . sodium chloride flush (NS) 0.9 % injection 3 mL  3 mL Intravenous Q12H Jackie Bongo, MD   3 mL at 02/24/16 0010  . thiamine (VITAMIN B-1) tablet 100 mg  100 mg Oral Daily Jackie Bongo, MD   100 mg at 02/24/16 [id number]   Or  . thiamine (B-1) injection 100 mg  100 mg Intravenous Daily Jackie Bongo, MD        Allergies as of 02/23/2016  . (No Known Allergies)    Family History  Problem Relation Age of Onset  . Alzheimer's disease Mother 29  . Colon cancer Neg Hx   . Stomach cancer Neg Hx     Social History   Social History  . Marital status: Widowed    Spouse name: N/A  . Number of children: N/A  . Years of education: N/A   Occupational History  . retired -Training and development officer, factory    Social History Main Topics  . Smoking status: Never Smoker  . Smokeless tobacco: Never Used  . Alcohol use Yes     Comment: socially. drinks gin and smirnoff vodka   . Drug use: No  . Sexual activity: Not on file   Other Topics Concern  . Not on file   Social History Narrative  . No narrative on file    Review of Systems: Gen: see HPI  CV: Denies chest pain, heart palpitations, syncope, edema  Resp: Denies shortness of breath with rest,  cough, wheezing GI: see HPI GU : Denies urinary burning, urinary frequency, urinary incontinence.  MS: see HP I Derm: Denies rash, itching, dry skin Psych: see HP I Heme: Denies bruising, bleeding, and enlarged lymph nodes.  Physical Exam: Vital signs in last 24 hours: Temp:  [97.4 F (36.3 C)-98.4 F (36.9 C)] 97.4 F (36.3 C) (11/21 0445) Pulse Rate:  [79-92] 85 (11/21 0445) Resp:  [15-18] 16 (11/21 0445) BP: (137-188)/(74-98) 172/98 (11/21 0445) SpO2:  [95 %-100 %] 100 % (11/21 0445) Weight:  [148 lb (67.1 kg)-157 lb 3 oz (71.3 kg)] 157 lb 3 oz (71.3 kg) (11/20 1805) Last BM Date: 02/23/16 General:   Alert,  Well-developed, well-nourished, pleasant and cooperative in NAD. Pleasantly confused, believes it is 1925. Knows she is in the [place] but unsure what city.  Head:  Normocephalic and atraumatic. Eyes:  Sclera clear, no icterus.    Ears:  Normal auditory acuity. Nose:  No deformity, discharge,  or lesions. Mouth:  No deformity or lesions Lungs:  Clear throughout to auscultation.   No wheezes, crackles, or rhonchi. No acute distress. Heart:  S1 S2 present Abdomen:  Soft, nontender and nondistended. No masses, hepatosplenomegaly or hernias noted. Normal bowel sounds, without guarding, and without rebound.   Rectal:  Deferred until time of colonoscopy.   Msk:  Symmetrical without gross deformities. Normal posture. Extremities:  Without lower extremity edema but right knee slightly more edematous than left  Neurologic:  Alert and  oriented to person and knows she is in the [place]. Does not know the city or year, stating it is [birth year].  Psych:  Alert and cooperative. Normal mood and affect.  Intake/Output from previous day: 11/20 0701 -  11/21 0700 In: 2828.8 [P.O.:480; I.V.:1376.8; Blood:972] Out: -  Intake/Output this shift: No intake/output data recorded.  Lab Results:  Recent Labs  02/23/16 1208 02/24/16 0542  WBC 11.6* 11.6*  HGB 5.7* 7.5*  HCT 18.0* 22.6*  PLT  340 324   BMET  Recent Labs  02/23/16 1208 02/24/16 0542  NA 141 141  K 2.8* 2.6*  CL 99* 104  CO2 29 28  GLUCOSE 170* 286*  BUN 37* 40*  CREATININE 2.43* 2.10*  CALCIUM 7.2* 6.7*   LFT  Recent Labs  02/23/16 1208  PROT 7.5  ALBUMIN 2.6*  AST 18  ALT 9*  ALKPHOS 63  BILITOT 0.6  BILIDIR 0.1  IBILI 0.5   PT/INR  Recent Labs  02/23/16 1208  LABPROT 15.8*  INR 1.26    Impression: 82 year old female presenting with acute on chronic anemia, heme negative and without overt GI bleeding. Constellation of associated symptoms to include weakness, worsening confusion, decreased appetite, and weight loss. Notably, her Hgb was 8.8 approximately 11 months ago but several years ago was normal. With her baseline of mild confusion and living alone, her history is somewhat unclear regarding NSAID use and ETOH use. She does have a significant history of tubulovillous adenomas with high grade dysplasia, and prior colonoscopies were completed by Dr. Fuller Hickman. Most recent was in Nov 2014 without evidence of recurrent adenomatous polyps; however, she is overdue for surveillance now. Hgb improved to 7.5 after 2 units PRBCs. Colonoscopy +/- EGD recommended in near future; will discuss with Dr. Oneida Alar timing of this. Continue supportive measures and anticipate one additional unit of PRBCs.   [place]: Continue to monitor H/H Anticipate an additional unit of PRBCs Management of hypokelamia per attending  Anticipate colonoscopy +/- EGD in near future: to discuss with Dr. Oneida Alar Protonix po daily   Annitta Needs, ANP-BC [facility]     LOS: 1 day    02/24/2016, 10:07 AM

## 2016-02-24 NOTE — Progress Notes (Signed)
Initial Nutrition Assessment  DOCUMENTATION CODES:  Not applicable  INTERVENTION:  Glucerna Shake po BID, each supplement provides 220 kcal and 10 grams of protein  Brief education on a weight maintaining diet NUTRITION DIAGNOSIS:  Increased nutrient needs related to acute illness (AKI) as evidenced by estimated nutritional requirements for this condition  GOAL:  Patient will meet greater than or equal to 90% of their needs  MONITOR:  PO intake, Supplement acceptance, Labs, I & O's  REASON FOR ASSESSMENT:  Consult Assessment of nutrition requirement/status  ASSESSMENT:  82 y/o female PMHx DM, HTN, HLD, gout, glaucoma. Presents with R leg pain,numbness in foot, early satiety and new fecal/urinary incontinence. Worked up for anemia and AKI on CKD. Possible ETOH abuse.    Pt and multiple family members at bedside. Pt herself reports a excellent appetite. She says she will eat throughout the entire day at home. She wouldn't specify about exactly what she was eating.   During the patient reporting a very god appetite with no trouble eating, multiple family members were shaking their heads in disagreement, however when asked about their opinions they had no comments.   Pt did not drink any nutritional supplements, take any vitamins or minerals or follow any type of diet.   She has a history of diarrhea. Believed to be related to metformin.  Family reports the pt's body weight was 200 lbs "when she started (losing)". There is no supporting documentation for this statement. She does appear to have a slow gradual weight loss, but this is natural in elderly. Her weight this admission is an outlier.   Rd recommended that if patient is, in fact, suffering from a reduced appetite and weight loss, she should consider drinking nutritional supplements such as Ensure or Glucerna. Alternatively, RD listed high kcal/protein foods she could prioritize to maintain weight ie cheese, eggs, peanut butter,  whole milk, butter milk.   Assessment: Family reports poor PO intake and weight loss, however there is no objective information to support this statement. Pt reports good intake, there is no history significant weight loss nor is their any signs of fat/muscle wasting.    Labs: Hypokalemia, hypomagnesemia, WBC: 11.6, H/H:7.5/22.6, Albumin:2.6 Medications: Folate, thiamin, mag, kcl, mvi w/ min, NS w/ KCL   Recent Labs Lab 02/23/16 1208 02/24/16 0542 02/24/16 0600  NA 141 141  --   K 2.8* 2.6*  --   CL 99* 104  --   CO2 29 28  --   BUN 37* 40*  --   CREATININE 2.43* 2.10*  --   CALCIUM 7.2* 6.7*  --   MG 1.2*  --  1.2*  GLUCOSE 170* 286*  --    Diet Order:  Diet Carb Modified Fluid consistency: Thin; Room service appropriate? Yes  Skin:  Reviewed, no issues  Last BM:  11/20- loose, watery  Height:  Ht Readings from Last 1 Encounters:  02/23/16 5\' 2"  (1.575 m)   Weight:  Wt Readings from Last 1 Encounters:  02/23/16 157 lb 3 oz (71.3 kg)   Wt Readings from Last 10 Encounters:  02/23/16 157 lb 3 oz (71.3 kg)  02/15/16 148 lb (67.1 kg)  05/12/15 148 lb 6.4 oz (67.3 kg)  03/20/15 146 lb (66.2 kg)  02/07/15 146 lb (66.2 kg)  11/07/14 150 lb 12.8 oz (68.4 kg)  08/05/14 150 lb (68 kg)  05/03/14 152 lb (68.9 kg)  04/15/14 150 lb (68 kg)  01/25/14 150 lb 12.8 oz (68.4 kg)   Ideal  Body Weight:  50 kg  BMI:  Body mass index is 28.75 kg/m.  Estimated Nutritional Needs:  Kcal:  1650-1850 kcals (23-26 kcal/kg bw) Protein:  60-70 g Pro (1.2-1.4 g/kg ibw) Fluid:  1.7-1.9 Liters (1 ml/kcal)  EDUCATION NEEDS:  Education needs addressed  Burtis Junes RD, LDN, CNSC Clinical Nutrition Pager: [phone] 02/24/2016 10:57 AM

## 2016-02-25 ENCOUNTER — Encounter (HOSPITAL_COMMUNITY)
Admission: EM | Disposition: A | Discharge status: Skilled Nursing Facility | Payer: Self-pay | Source: Home / Self Care | Attending: Internal Medicine

## 2016-02-25 ENCOUNTER — Encounter (HOSPITAL_COMMUNITY): Payer: Self-pay | Admitting: *Deleted

## 2016-02-25 DIAGNOSIS — Z86010 Personal history of colonic polyps: Secondary | ICD-10-CM

## 2016-02-25 DIAGNOSIS — K3189 Other diseases of stomach and duodenum: Secondary | ICD-10-CM

## 2016-02-25 DIAGNOSIS — D120 Benign neoplasm of cecum: Secondary | ICD-10-CM

## 2016-02-25 DIAGNOSIS — R627 Adult failure to thrive: Secondary | ICD-10-CM

## 2016-02-25 HISTORY — PX: COLONOSCOPY: SHX5424

## 2016-02-25 HISTORY — PX: ESOPHAGOGASTRODUODENOSCOPY: SHX5428

## 2016-02-25 LAB — GLUCOSE, CAPILLARY
GLUCOSE-CAPILLARY: 118 mg/dL — AB (ref 65–99)
GLUCOSE-CAPILLARY: 70 mg/dL (ref 65–99)
Glucose-Capillary: 75 mg/dL (ref 65–99)
Glucose-Capillary: 78 mg/dL (ref 65–99)
Glucose-Capillary: 73 mg/dL (ref 65–99)
Glucose-Capillary: 99 mg/dL (ref 65–99)

## 2016-02-25 LAB — BASIC METABOLIC PANEL
ANION GAP: 9 (ref 5–15)
BUN: 43 mg/dL — ABNORMAL HIGH (ref 6–20)
CHLORIDE: 105 mmol/L (ref 101–111)
CO2: 25 mmol/L (ref 22–32)
Calcium: 7.6 mg/dL — ABNORMAL LOW (ref 8.9–10.3)
Creatinine, Ser: 2.34 mg/dL — ABNORMAL HIGH (ref 0.44–1.00)
GFR calc non Af Amer: 18 mL/min — ABNORMAL LOW (ref >60)
GFR, EST AFRICAN AMERICAN: 21 mL/min — AB (ref >60)
Glucose, Bld: 116 mg/dL — ABNORMAL HIGH (ref 65–99)
Potassium: 3.3 mmol/L — ABNORMAL LOW (ref 3.5–5.1)
Sodium: 139 mmol/L (ref 135–145)

## 2016-02-25 LAB — TYPE AND SCREEN
ABO/RH(D): O POS
Antibody Screen: NEGATIVE
Unit division: 0
Unit division: 0
Unit division: 0

## 2016-02-25 LAB — HEMOGLOBIN A1C
Hgb A1c MFr Bld: 7 % — ABNORMAL HIGH (ref 4.8–5.6)
Mean Plasma Glucose: 154 mg/dL

## 2016-02-25 LAB — CBC
HEMATOCRIT: 24.9 % — AB (ref 36.0–46.0)
HEMOGLOBIN: 8.3 g/dL — AB (ref 12.0–15.0)
MCH: 29.3 pg (ref 26.0–34.0)
MCHC: 33.3 g/dL (ref 30.0–36.0)
MCV: 88 fL (ref 78.0–100.0)
Platelets: 356 10*3/uL (ref 150–400)
RBC: 2.83 MIL/uL — AB (ref 3.87–5.11)
RDW: 16 % — ABNORMAL HIGH (ref 11.5–15.5)
WBC: 10.9 10*3/uL — ABNORMAL HIGH (ref 4.0–10.5)

## 2016-02-25 LAB — MAGNESIUM: Magnesium: 1.9 mg/dL (ref 1.7–2.4)

## 2016-02-25 LAB — FOLATE RBC
Folate, Hemolysate: 301.4 ng/mL
Folate, RBC: 1310 ng/mL (ref >498)
Hematocrit: 23 % — ABNORMAL LOW (ref 34.0–46.6)

## 2016-02-25 SURGERY — EGD (ESOPHAGOGASTRODUODENOSCOPY)
Anesthesia: Moderate Sedation

## 2016-02-25 MED ORDER — PROMETHAZINE HCL 25 MG/ML IJ SOLN
INTRAMUSCULAR | Status: AC
  Filled 2016-02-25: qty 1

## 2016-02-25 MED ORDER — MEPERIDINE HCL 100 MG/ML IJ SOLN
INTRAMUSCULAR | Status: AC
  Filled 2016-02-25: qty 2

## 2016-02-25 MED ORDER — BISACODYL 5 MG PO TBEC
10.0000 mg | DELAYED_RELEASE_TABLET | Freq: Once | ORAL | Status: AC
  Administered 2016-02-25: 10 mg via ORAL
  Filled 2016-02-25: qty 2

## 2016-02-25 MED ORDER — LIDOCAINE VISCOUS 2 % MT SOLN
OROMUCOSAL | Status: AC
  Filled 2016-02-25: qty 15

## 2016-02-25 MED ORDER — STERILE WATER FOR IRRIGATION IR SOLN
Status: DC | PRN
  Administered 2016-02-25: 4 mL

## 2016-02-25 MED ORDER — LIDOCAINE VISCOUS 2 % MT SOLN
OROMUCOSAL | Status: DC | PRN
  Administered 2016-02-25: 5 mL via OROMUCOSAL

## 2016-02-25 MED ORDER — SODIUM CHLORIDE 0.9% FLUSH
INTRAVENOUS | Status: AC
  Filled 2016-02-25: qty 10

## 2016-02-25 MED ORDER — PEG 3350-KCL-NA BICARB-NACL 420 G PO SOLR
4000.0000 mL | Freq: Once | ORAL | Status: AC
  Administered 2016-02-25: 4000 mL via ORAL

## 2016-02-25 MED ORDER — SODIUM CHLORIDE 0.9 % IV SOLN
INTRAVENOUS | Status: DC
  Administered 2016-02-25: 15:00:00 via INTRAVENOUS

## 2016-02-25 MED ORDER — PROMETHAZINE HCL 25 MG/ML IJ SOLN
12.5000 mg | INTRAMUSCULAR | Status: AC
  Administered 2016-02-25: 12.5 mg via INTRAVENOUS

## 2016-02-25 MED ORDER — DEXTROSE IN LACTATED RINGERS 5 % IV SOLN
INTRAVENOUS | Status: DC
  Administered 2016-02-25: 12:00:00 via INTRAVENOUS

## 2016-02-25 MED ORDER — MIDAZOLAM HCL 5 MG/5ML IJ SOLN
INTRAMUSCULAR | Status: DC | PRN
  Administered 2016-02-25: 1 mg via INTRAVENOUS

## 2016-02-25 MED ORDER — MIDAZOLAM HCL 5 MG/5ML IJ SOLN
INTRAMUSCULAR | Status: AC
  Filled 2016-02-25: qty 10

## 2016-02-25 MED ORDER — ONDANSETRON HCL 4 MG/2ML IJ SOLN
INTRAMUSCULAR | Status: AC
  Filled 2016-02-25: qty 2

## 2016-02-25 MED ORDER — ONDANSETRON HCL 4 MG/2ML IJ SOLN
INTRAMUSCULAR | Status: DC | PRN
  Administered 2016-02-25: 4 mg via INTRAVENOUS

## 2016-02-25 MED ORDER — MEPERIDINE HCL 100 MG/ML IJ SOLN
INTRAMUSCULAR | Status: DC | PRN
Start: 2016-02-25 — End: 2016-02-25
  Administered 2016-02-25: 25 mg via INTRAVENOUS

## 2016-02-25 NOTE — Progress Notes (Signed)
PT Cancellation Note  Patient Details Name: Jackie Hickman MRN: [id number] DOB: [birth year]   Cancelled Treatment:    Reason Eval/Treat Not Completed: Other (comment) (Pt with NT - just getting ready to get a bath.  Will check back later. )   Beth Jailen Coward, PT, DPT X: [phone]

## 2016-02-25 NOTE — Progress Notes (Addendum)
Subjective:  Patient pleasant but confused. Alert and oriented to person only. Thinks she is in a nursing home. October is the month and the year is 19. Denies abdominal pain. Tells me she wants the colonoscopy and possible upper endoscopy before going home.   Objective: Vital signs in last 24 hours: Temp:  [98.1 F (36.7 C)-98.6 F (37 C)] 98.4 F (36.9 C) (11/22 [id number]) Pulse Rate:  [66-100] 66 (11/22 0613) Resp:  [16-20] 20 (11/22 [id number]) BP: (115-143)/(64-84) 124/81 (11/22 [id number]) SpO2:  [94 %-100 %] 98 % (11/22 [id number]) Last BM Date: 02/24/16 General:   Alert,  Well-developed, well-nourished, pleasant and cooperative in NAD Head:  Normocephalic and atraumatic. Eyes:  Sclera clear, no icterus.   Abdomen:  Soft, nontender and nondistended.  Normal bowel sounds, without guarding, and without rebound.   Extremities:  Without clubbing, deformity or edema. Neurologic:  Alert and  oriented to person only.  Skin:  Intact without significant lesions or rashes. Psych:  Alert and cooperative. Normal mood and affect.  Intake/Output from previous day: 11/21 0701 - 11/22 0700 In: 2256.7 [I.V.:1519.2; Blood:737.5] Out: -  Intake/Output this shift: No intake/output data recorded.  Lab Results: CBC  Recent Labs  02/23/16 1208 02/24/16 0542 02/24/16 1957 02/25/16 0621  WBC 11.6* 11.6*  --  10.9*  HGB 5.7* 7.5* 8.5* 8.3*  HCT 18.0* 22.6* 25.6* 24.9*  MCV 90.0 86.9  --  88.0  PLT 340 324  --  356   BMET  Recent Labs  02/23/16 1208 02/24/16 0542 02/25/16 0621  NA 141 141 139  K 2.8* 2.6* 3.3*  CL 99* 104 105  CO2 29 28 25   GLUCOSE 170* 286* 116*  BUN 37* 40* 43*  CREATININE 2.43* 2.10* 2.34*  CALCIUM 7.2* 6.7* 7.6*   LFTs  Recent Labs  02/23/16 1208  BILITOT 0.6  BILIDIR 0.1  IBILI 0.5  ALKPHOS 63  AST 18  ALT 9*  PROT 7.5  ALBUMIN 2.6*   No results for input(s): LIPASE in the last 72 hours. PT/INR  Recent Labs  02/23/16 1208  LABPROT 15.8*  INR 1.26       Imaging Studies: Dg Cervical Spine 1 View  Result Date: 02/24/2016 CLINICAL DATA:  Neck pain. EXAM: CERVICAL SPINE 1 VIEW COMPARISON:  No recent prior . FINDINGS: Multilevel degenerative change. No acute abnormality identified. No evidence of fracture dislocation. Posterior cervical wiring C6-C7 noted. IMPRESSION: Postsurgical change cervical spine. Diffuse degenerative change. No acute bony abnormality identified. No evidence of fracture or dislocation. Electronically Signed   By: Marcello Moores  Register   On: 02/24/2016 11:59   Dg Lumbar Spine 2-3 Views  Result Date: 02/24/2016 CLINICAL DATA:  Right lower leg pain EXAM: LUMBAR SPINE - 2-3 VIEW COMPARISON:  None available FINDINGS: Bones are osteopenic. Normal alignment. Preserved vertebral body heights. Diffuse lower thoracic and lumbar degenerative spondylosis at all levels with disc space narrowing, sclerosis and endplate osteophytes. Lower lumbar facet arthropathy at L5-S1. Transitional S1 segment noted. Pedicles appear intact. Normal SI joints for age. Aortoiliac atherosclerosis noted. Bowel gas pattern is normal.  Remote cholecystectomy evident. IMPRESSION: Diffuse thoracolumbar degenerative spondylosis without definite acute osseous finding or malalignment by plain radiography. Aortoiliac atherosclerosis Electronically Signed   By: Jerilynn Mages.  Shick M.D.   On: 02/24/2016 08:17   US Abdomen Complete  Result Date: 02/24/2016 CLINICAL DATA:  Nonspecific pain and incontinence EXAM: ABDOMEN ULTRASOUND COMPLETE COMPARISON:  None. FINDINGS: Gallbladder: Not visualized and suspected absent. Common bile duct: Diameter: 5 mm. There is  no intrahepatic, common hepatic, or common bile duct dilatation. Liver: No focal lesion identified. Within normal limits in parenchymal echogenicity. IVC: No abnormality visualized. Pancreas: No mass or inflammatory focus. Spleen: Size and appearance within normal limits. Right Kidney: Length: 9.4 cm. Echogenicity within normal limits.  No mass or hydronephrosis visualized. Left Kidney: Length: 9.7 cm. Echogenicity within normal limits. No mass or hydronephrosis visualized. Abdominal aorta: No aneurysm visualized. Other findings: No demonstrable ascites. IMPRESSION: Gallbladder not visualized and presumed absent. Study otherwise unremarkable. Electronically Signed   By: Lowella Grip III M.D.   On: 02/24/2016 08:42   Dg Foot Complete Left  Result Date: 02/15/2016 CLINICAL DATA:  Left foot pain without trauma.  History of gout. EXAM: LEFT FOOT - COMPLETE 3+ VIEW COMPARISON:  None. FINDINGS: Diffuse osteopenia. No fractures. Small plantar spur. Small erosion at the distal aspect of the first proximal phalanx is probably not changed given difference in technique. IMPRESSION: No acute interval change. Electronically Signed   By: Dorise Bullion III M.D   On: 02/15/2016 15:46  [2 weeks]   Assessment:  82 year old female presenting with acute on chronic anemia, heme negative and without overt GI bleeding. Constellation of associated symptoms to include weakness, worsening confusion, decreased appetite, and weight loss. With her baseline of mild confusion and living alone, her history is somewhat unclear regarding NSAID use and ETOH use. She does have a significant history of tubulovillous adenomas with high grade dysplasia, and prior colonoscopies were completed by Dr. Fuller Plan. Most recent was in Nov 2014 without evidence of recurrent adenomatous polyps; however, she is overdue for surveillance now. Hgb improved to 8.3 after 3 units PRBCs.  Creatinine remains elevated. Double from one year ago. Potassium improved.   Plan: 1. Continue fluid resuscitation. 2. To discuss with family regarding patient's desire for colonoscopy +/- EGD prior to discharge.  3. Currently patient is NPO. Will give her clear liquids.   Laureen Ochs. Bernarda Caffey [facility] [phone] 11/22/20179:05 AM     LOS: 2 days     Addendum: Spoke to Dr. Gala Romney, patient's daughter, [name] Ngom. Plan for EGD/TCS this afternoon. Plan for Phenergan 12.5mg  IV on call to endo given ?etoh history. Spoke with melanie in endo about phenergan.  The risks, benefits, limitations, imponderables and alternatives regarding both EGD and colonoscopy have been reviewed with the patient. Questions have been answered. All parties agreeable.    Laureen Ochs. Bernarda Caffey [facility] [phone] 11/22/201710:22 AM

## 2016-02-25 NOTE — Progress Notes (Signed)
PT Cancellation Note  Patient Details Name: ULA CATES MRN: [id number] DOB: February 12, 1933   Cancelled Treatment:    Reason Eval/Treat Not Completed: Other (comment) (Attempted to see pt again this PM, however transport arrived to take pt off the floor for EGD.  Will check back on Friday (02/27/16) if pt is still in acute care setting. )   Beth Jeric Slagel, PT, DPT X: [phone]

## 2016-02-25 NOTE — Op Note (Signed)
[facility] Patient Name: Jackie Hickman Procedure Date: 02/25/2016 3:03 PM MRN: [id number] Date of Birth: 1932-12-31 Attending MD: Norvel Richards , MD CSN: [id number] Age: 82 Admit Type: Outpatient Procedure:                Colonoscopy Indications:              High risk colon cancer surveillance: Personal                            history of colonic polyps; profound anemia. Providers:                Norvel Richards, MD, Gwynneth Albright RN,                            RN, Isabella Stalling, Technician Referring MD:              Medicines:                Midazolam 1 mg IV, Meperidine 25 mg IV, Ondansetron                            4 mg IV, Promethazine [id number] mg IV Complications:            No immediate complications. Estimated Blood Loss:     Estimated blood loss was minimal. Procedure:                Pre-Anesthesia Assessment:                           - Prior to the procedure, a History and Physical                            was performed, and patient medications and                            allergies were reviewed. The patient's tolerance of                            previous anesthesia was also reviewed. The risks                            and benefits of the procedure and the sedation                            options and risks were discussed with the patient.                            All questions were answered, and informed consent                            was obtained. Prior Anticoagulants: The patient has                            taken no previous anticoagulant or antiplatelet  agents. ASA Grade Assessment: III - A patient with                            severe systemic disease. After reviewing the risks                            and benefits, the patient was deemed in                            satisfactory condition to undergo the procedure.                           After obtaining informed consent, the colonoscope                             was passed under direct vision. Throughout the                            procedure, the patient's blood pressure, pulse, and                            oxygen saturations were monitored continuously. The                            [id number] scope was introduced through                            the anus and advanced to the 5 cm into the ileum.                            The colonoscopy was performed without difficulty.                            The patient tolerated the procedure well. The                            quality of the bowel preparation was adequate. The                            terminal ileum, ileocecal valve, appendiceal                            orifice, and rectum were photographed. The entire                            colon was well visualized. Scope In: 3:06:54 PM Scope Out: 3:17:14 PM Scope Withdrawal Time: 0 hours 7 minutes 59 seconds  Total Procedure Duration: 0 hours 10 minutes 20 seconds  Findings:      The perianal and digital rectal examinations were normal.      A 5 mm polyp was found in the cecum. The polyp was semi-pedunculated.       The polyp was removed with a cold snare. Resection and retrieval were       complete. Estimated  blood loss was minimal.      The exam was otherwise without abnormality on direct and retroflexion       views. The distal 5 cm of terminal ileal mucosa appeared normal as well. Impression:               - One 5 mm polyp in the cecum, removed with a cold                            snare. Resected and retrieved.                           - The examination was otherwise normal on direct                            and retroflexion views. Moderate Sedation:      Moderate (conscious) sedation was administered by the endoscopy nurse       and supervised by the endoscopist. The following parameters were       monitored: oxygen saturation, heart rate, blood pressure, respiratory       rate, EKG,  adequacy of pulmonary ventilation, and response to care.       Total physician intraservice time was 30 minutes. Recommendation:           - Return patient to hospital ward for ongoing care.                           - Diabetic (ADA) diet.                           - No repeat colonoscopy due to age.                           - Return to GI office (date not yet determined).                            See EGD report. Recommend follow hemoglobin                            closely. Consider other non-GI bleeding causes of                            anemia. Patient can follow-up with our group or Dr.                            Fuller Plan Procedure Code(s):        --- Professional ---                           [phone], Colonoscopy, flexible; with removal of                            tumor(s), polyp(s), or other lesion(s) by snare                            technique  [id number], Moderate sedation services provided by the                            same physician or other qualified health care                            professional performing the diagnostic or                            therapeutic service that the sedation supports,                            requiring the presence of an independent trained                            observer to assist in the monitoring of the                            patient's level of consciousness and physiological                            status; initial 15 minutes of intraservice time,                            patient age 11 years or older                           [phone], Moderate sedation services; each additional                            15 minutes intraservice time Diagnosis Code(s):        --- Professional ---                           Z86.010, Personal history of colonic polyps                           D12.0, Benign neoplasm of cecum CPT copyright 2016 [employer]. All rights reserved. The codes documented in  this report are preliminary and upon coder review may  be revised to meet current compliance requirements. Cristopher Estimable. Candice Tobey, MD Norvel Richards, MD 02/25/2016 3:32:04 PM This report has been signed electronically. Number of Addenda: 0

## 2016-02-25 NOTE — Care Management Important Message (Signed)
Important Message  Patient Details  Name: Jackie Hickman MRN: [id number] Date of Birth: [birth year]   Medicare Important Message Given:  Yes    Sherald Barge, RN 02/25/2016, 1:13 PM

## 2016-02-25 NOTE — Clinical Social Work Placement (Signed)
   CLINICAL SOCIAL WORK PLACEMENT  NOTE  Date:  02/25/2016  Patient Details  Name: Jackie Hickman MRN: [id number] Date of Birth: 1932/10/09  Clinical Social Work is seeking post-discharge placement for this patient at the [place] level of care (*CSW will initial, date and re-position this form in  chart as items are completed):  Yes   Patient/family provided with [facility] Work Department's list of facilities offering this level of care within the geographic area requested by the patient (or if unable, by the patient's family).  Yes   Patient/family informed of their freedom to choose among providers that offer the needed level of care, that participate in Medicare, Medicaid or managed care program needed by the patient, have an available bed and are willing to accept the patient.  Yes   Patient/family informed of [facility]'s ownership interest in [facility] and [facility], as well as of the fact that they are under no obligation to receive care at these facilities.  PASRR submitted to EDS on 02/25/16     PASRR number received on 02/25/16     Existing PASRR number confirmed on       FL2 transmitted to all facilities in geographic area requested by pt/family on 02/25/16     FL2 transmitted to all facilities within larger geographic area on       Patient informed that his/her managed care company has contracts with or will negotiate with certain facilities, including the following:            Patient/family informed of bed offers received.  Patient chooses bed at       Physician recommends and patient chooses bed at      Patient to be transferred to   on  .  Patient to be transferred to facility by       Patient family notified on   of transfer.  Name of family member notified:        PHYSICIAN       Additional Comment:    _______________________________________________ Salome Arnt, [place] 02/25/2016, 8:59  AM [phone]

## 2016-02-25 NOTE — Progress Notes (Signed)
PROGRESS NOTE    Jackie Hickman  [id number] DOB: [birth year] DOA: 02/23/2016 PCP: [name], FNP    Brief Narrative:  Patient is an 82 year old woman with a history of DM, HTN, gout, glaucoma, DJD, who presented to the ED on 02/23/2016 with a chief complaint of left leg and left foot pain. The patient denied any recent falls. She had been having some generalized weakness and mild fecal and urinary incontinence. She has had some poor appetite and early satiety. She denied bright red blood per rectum or black tarry stools. Her family reports that she drinks alcohol almost every day. In the ED, she was afebrile and hemodynamically stable. Her lab data were significant for a hemoglobin of 5.7, hematocrit of 18, MCV of 90, creatinine of 2.43, potassium of 2.8, and magnesium level of 1.2. She was admitted for further evaluation and management.   Assessment & Plan:   Principal Problem:   Normocytic anemia Active Problems:   Essential hypertension   Type 2 diabetes mellitus with renal manifestations (HCC)   Hyperlipidemia   Hypokalemia   Protein-calorie malnutrition (HCC)   Acute kidney injury superimposed on chronic kidney disease (HCC)   Alcohol abuse   Incontinence   Hypomagnesemia   Severe normocytic anemia. The patient's hemoglobin was 5.7 on admission. Apparently her stool was guaiac negative in the ED. Her hemoglobin 03/2015 was 8.8. The etiology of her anemia is unclear. -She was transfused a total of 3 units of packed red blood cells following admission. Her hemoglobin improved appropriately. -GI was consulted. The tentative plan is for a colonoscopy and/or EGD today. -Protonix was added daily. Naproxen was discontinued.  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 Patient's creatinine was 2.43 on admission, down to 2.34 on 11/22. Her baseline creatinine appears to be around 1.2. -She was started on gentle IV fluids for hydration. HCTZ and  Cozaar were withheld. -Her creatinine is improving, but not back at baseline. Will continue IV fluids. -recheck renal function in am; suspect this may be a new baseline for her.  Essential hypertension. Patient is treated chronically with amlodipine, bystolic. HCTZ, and losartan. HCTZ and losartan are being held due to AKI. Her blood pressure was uncontrolled. -Continue bystolic and Norvasc, but will increase the dose to 10 mg daily. We'll continue necessary hydralazine IV. -Do not anticipate further medication changes during this hospitalization.  Hypokalemia and hypomagnesemia. Patient's serum potassium was 2.8 and her magnesium was 1.2 on admission.  -We'll continue to monitor and continue repletion/supplementation as needed.  Type 2 diabetes mellitus. Patient is treated chronically with metformin. It is being held in the setting of renal dysfunction. Sliding scale NovoLog was started. -We'll change sliding scale NovoLog to resistant scale and add Lantus. -Hemoglobin A1c ordered and is pending.  Alcohol abuse.  Patient admits to drinking about a private of gin weekly. She was strongly advised to stop drinking indefinitely. CIWA protocol was started. She shows no signs of alcohol withdrawal syndrome.  Foot pain/probable peripheral neuropathy/neck pain.  Patient has a history of gout, but likely has DJD. Apparently, she was treated with NSAIDs. -We'll order a cervical spine x-ray for evaluation. -We'll start gabapentin daily at bedtime, topical Voltaren for her neck pain, and continue when necessary Vicodin. -PT consult ordered.       DVT prophylaxis: SCDs Code Status: Full code Family Communication: Discussed with the daughter Disposition Plan: to SNF likely in am.   Consultants:   Gastroenterology  Procedures:  None  Antimicrobials:  None    Subjective: Patient complains of right-sided neck pain. She denies weakness in her arms or legs. She reports  long-standing numbness and tingling in her feet. The pain is not as bad as when she first came in.  Objective: Vitals:   02/24/16 1702 02/24/16 1740 02/24/16 2100 02/25/16 0613  BP: (!) 143/80 129/64 (!) 141/84 124/81  Pulse: 71 66 71 66  Resp: 16 16 20 20   Temp: 98.5 F (36.9 C) 98.3 F (36.8 C) 98.6 F (37 C) 98.4 F (36.9 C)  TempSrc: Oral Oral Oral Oral  SpO2: 100% 100% 94% 98%  Weight:      Height:        Intake/Output Summary (Last 24 hours) at 02/25/16 1140 Last data filed at 02/25/16 0900  Gross per 24 hour  Intake          1156.67 ml  Output                0 ml  Net          1156.67 ml   Filed Weights   02/23/16 1138 02/23/16 1805  Weight: 67.1 kg (148 lb) 71.3 kg (157 lb 3 oz)    Examination:  General exam: Pleasant obese 82 year old African-American woman in no acute distress.  Respiratory system: Clear to auscultation. Respiratory effort normal. Cardiovascular system: S1 & S2 with a 1 to 2/6 systolic murmur. No pedal edema. Gastrointestinal system: Abdomen is obese, nondistended, soft and nontender. No organomegaly or masses felt. Normal bowel sounds heard. Central nervous system: Alert and oriented to herself and hospital. Her speech is clear. No focal neurological deficits. Musculoskeletal: She has some mild-moderate tenderness over her paracervical spine muscles and right trapezius and shoulder without erythema or fluctuance or warmth. Good range of motion of her neck. Skin: No rashes, lesions or ulcers Psychiatry: Judgement and insight appear normal. Mood & affect appropriate.     Data Reviewed: I have personally reviewed following labs and imaging studies  CBC:  Recent Labs Lab 02/23/16 1208 02/24/16 0542 02/24/16 1957 02/25/16 0621  WBC 11.6* 11.6*  --  10.9*  NEUTROABS 9.7*  --   --   --   HGB 5.7* 7.5* 8.5* 8.3*  HCT 18.0* 22.6* 25.6* 24.9*  MCV 90.0 86.9  --  88.0  PLT 340 324  --  [id number]   Basic Metabolic Panel:  Recent Labs Lab  02/23/16 1208 02/24/16 0542 02/24/16 0600 02/25/16 0621  NA 141 141  --  139  K 2.8* 2.6*  --  3.3*  CL 99* 104  --  105  CO2 29 28  --  25  GLUCOSE 170* 286*  --  116*  BUN 37* 40*  --  43*  CREATININE 2.43* 2.10*  --  2.34*  CALCIUM 7.2* 6.7*  --  7.6*  MG 1.2*  --  1.2* 1.9   GFR: Estimated Creatinine Clearance: 17.4 mL/min (by C-G formula based on SCr of 2.34 mg/dL (H)). Liver Function Tests:  Recent Labs Lab 02/23/16 1208  AST 18  ALT 9*  ALKPHOS 63  BILITOT 0.6  PROT 7.5  ALBUMIN 2.6*   No results for input(s): LIPASE, AMYLASE in the last 168 hours. No results for input(s): AMMONIA in the last 168 hours. Coagulation Profile:  Recent Labs Lab 02/23/16 1208  INR 1.26   Cardiac Enzymes: No results for input(s): CKTOTAL, CKMB, CKMBINDEX, TROPONINI in the last 168 hours. BNP (last 3 results) No results for input(s): PROBNP in the  last 8760 hours. HbA1C:  Recent Labs  02/23/16 1208  HGBA1C 7.0*   CBG:  Recent Labs Lab 02/24/16 0847 02/24/16 1146 02/24/16 1624 02/24/16 2111 02/25/16 0740  GLUCAP 268* 142* 181* 188* 118*   Lipid Profile: No results for input(s): CHOL, HDL, LDLCALC, TRIG, CHOLHDL, LDLDIRECT in the last 72 hours. Thyroid Function Tests: No results for input(s): TSH, T4TOTAL, FREET4, T3FREE, THYROIDAB in the last 72 hours. Anemia Panel:  Recent Labs  02/23/16 1208  VITAMINB12 463   Sepsis Labs: No results for input(s): PROCALCITON, LATICACIDVEN in the last 168 hours.  No results found for this or any previous visit (from the past 240 hour(s)).       Radiology Studies: Dg Cervical Spine 1 View  Result Date: 02/24/2016 CLINICAL DATA:  Neck pain. EXAM: CERVICAL SPINE 1 VIEW COMPARISON:  No recent prior . FINDINGS: Multilevel degenerative change. No acute abnormality identified. No evidence of fracture dislocation. Posterior cervical wiring C6-C7 noted. IMPRESSION: Postsurgical change cervical spine. Diffuse degenerative  change. No acute bony abnormality identified. No evidence of fracture or dislocation. Electronically Signed   By: Marcello Moores  Register   On: 02/24/2016 11:59   Dg Lumbar Spine 2-3 Views  Result Date: 02/24/2016 CLINICAL DATA:  Right lower leg pain EXAM: LUMBAR SPINE - 2-3 VIEW COMPARISON:  None available FINDINGS: Bones are osteopenic. Normal alignment. Preserved vertebral body heights. Diffuse lower thoracic and lumbar degenerative spondylosis at all levels with disc space narrowing, sclerosis and endplate osteophytes. Lower lumbar facet arthropathy at L5-S1. Transitional S1 segment noted. Pedicles appear intact. Normal SI joints for age. Aortoiliac atherosclerosis noted. Bowel gas pattern is normal.  Remote cholecystectomy evident. IMPRESSION: Diffuse thoracolumbar degenerative spondylosis without definite acute osseous finding or malalignment by plain radiography. Aortoiliac atherosclerosis Electronically Signed   By: Jerilynn Mages.  Shick M.D.   On: 02/24/2016 08:17   US Abdomen Complete  Result Date: 02/24/2016 CLINICAL DATA:  Nonspecific pain and incontinence EXAM: ABDOMEN ULTRASOUND COMPLETE COMPARISON:  None. FINDINGS: Gallbladder: Not visualized and suspected absent. Common bile duct: Diameter: 5 mm. There is no intrahepatic, common hepatic, or common bile duct dilatation. Liver: No focal lesion identified. Within normal limits in parenchymal echogenicity. IVC: No abnormality visualized. Pancreas: No mass or inflammatory focus. Spleen: Size and appearance within normal limits. Right Kidney: Length: 9.4 cm. Echogenicity within normal limits. No mass or hydronephrosis visualized. Left Kidney: Length: 9.7 cm. Echogenicity within normal limits. No mass or hydronephrosis visualized. Abdominal aorta: No aneurysm visualized. Other findings: No demonstrable ascites. IMPRESSION: Gallbladder not visualized and presumed absent. Study otherwise unremarkable. Electronically Signed   By: Lowella Grip III M.D.   On:  02/24/2016 08:42        Scheduled Meds: . amLODipine  10 mg Oral Daily  . atorvastatin  40 mg Oral q1800  . bisacodyl  10 mg Oral Once  . brimonidine  1 drop Both Eyes Q12H   And  . timolol  1 drop Both Eyes Q12H  . diclofenac sodium  4 g Topical BID  . feeding supplement (GLUCERNA SHAKE)  237 mL Oral BID BM  . fenofibrate  160 mg Oral Daily  . folic acid  1 mg Oral Daily  . gabapentin  100 mg Oral QHS  . insulin aspart  0-20 Units Subcutaneous TID WC  . insulin aspart  0-5 Units Subcutaneous QHS  . insulin glargine  20 Units Subcutaneous QHS  . latanoprost  1 drop Both Eyes QHS  . multivitamin with minerals  1 tablet Oral Daily  .  nebivolol  20 mg Oral Daily  . pantoprazole  40 mg Oral Daily  . promethazine  12.5 mg Intravenous On Call  . sodium chloride flush  3 mL Intravenous Q12H  . thiamine  100 mg Oral Daily   Or  . thiamine  100 mg Intravenous Daily   Continuous Infusions: . sodium chloride    . 0.9 % NaCl with KCl 20 mEq / L 75 mL/hr at 02/24/16 0836  . lactated ringers 50 mL/hr at 02/23/16 2359     LOS: 2 days    Time spent: 25 minutes    Lelon Frohlich, MD [facility] Pager [phone]  If 7PM-7AM, please contact night-coverage [email or web address] Password [email or web address] 02/25/2016, 11:40 AM

## 2016-02-25 NOTE — Clinical Social Work Note (Signed)
Clinical Social Work Assessment  Patient Details  Name: Jackie Hickman MRN: [id number] Date of Birth: May 25, 1932  Date of referral:  02/25/16               Reason for consult:  Discharge Planning                Permission sought to share information with:  Family Supports Permission granted to share information::  Yes, Verbal Permission Granted  Name::     Jackie Hickman  Agency::     Relationship::  daughter  Contact Information:     Housing/Transportation Living arrangements for the past 2 months:  Single Family Home Source of Information:  Patient, Adult Children Patient Interpreter Needed:  None Criminal Activity/Legal Involvement Pertinent to Current Situation/Hospitalization:  No - Comment as needed Significant Relationships:  Adult Children Lives with:  Adult Children Do you feel safe going back to the place where you live?  Yes Need for family participation in patient care:  Yes (Comment)  Care giving concerns:  Pt will need SNF.    Social Worker assessment / plan:  CSW met with pt at bedside. Pt oriented to self and place only. She agrees for CSW to speak with her daughter, Jackie Hickman. Per [facility], pt has been living with her son. They were hopeful that pt would be able to go to SNF at d/c. CSW informed her of PT recommendation for SNF. Jackie Hickman requests placement at [facility] or [facility] facility.   Employment status:  Retired Surveyor, [employer] PT Recommendations:  [place] / Referral to community resources:  [place]  Patient/Family's Response to care:  Pt's daughter requests short term rehab when medically stable.   Patient/Family's Understanding of and Emotional Response to Diagnosis, Current Treatment, and Prognosis:  Pt's family appears to be aware of admission diagnosis and treatment plan.   Emotional Assessment Appearance:  Appears stated age Attitude/Demeanor/Rapport:  Other (Cooperative) Affect  (typically observed):  Pleasant Orientation:  Oriented to Self, Oriented to Place Alcohol / Substance use:  Not Applicable Psych involvement (Current and /or in the community):  No (Comment)  Discharge Needs  Concerns to be addressed:  Discharge Planning Concerns Readmission within the last 30 days:  No Current discharge risk:  Physical Impairment Barriers to Discharge:  No Barriers Identified   Jackie Hickman, [place] 02/25/2016, 9:03 AM [phone]

## 2016-02-25 NOTE — Op Note (Signed)
[facility] Patient Name: Jackie Hickman Procedure Date: 02/25/2016 2:41 PM MRN: [id number] Date of Birth: [birth year] Attending MD: Norvel Richards , MD CSN: [id number] Age: 82 Admit Type: Outpatient Procedure:                Upper GI endoscopy Indications:              Failure to thrive Providers:                Norvel Richards, MD, Janeece Riggers, RN, Isabella Stalling, Technician Referring MD:              Medicines:                Midazolam 1 mg IV, Meperidine 25 mg IV,                            Promethazine 12.5 mg IV, Ondansetron 4 mg IV Complications:            No immediate complications. Estimated Blood Loss:     Estimated blood loss was minimal. Procedure:                Pre-Anesthesia Assessment:                           - Prior to the procedure, a History and Physical                            was performed, and patient medications and                            allergies were reviewed. The patient's tolerance of                            previous anesthesia was also reviewed. The risks                            and benefits of the procedure and the sedation                            options and risks were discussed with the patient.                            All questions were answered, and informed consent                            was obtained. Prior Anticoagulants: The patient has                            taken no previous anticoagulant or antiplatelet                            agents. ASA Grade Assessment: III - A patient with  severe systemic disease. After reviewing the risks                            and benefits, the patient was deemed in                            satisfactory condition to undergo the procedure.                           After obtaining informed consent, the endoscope was                            passed under direct vision. Throughout the   procedure, the patient's blood pressure, pulse, and                            oxygen saturations were monitored continuously. The                            [id number]) was introduced through the                            mouth, and advanced to the second part of duodenum.                            The upper GI endoscopy was accomplished without                            difficulty. The patient tolerated the procedure                            well. Scope In: 2:56:58 PM Scope Out: 3:01:39 PM Total Procedure Duration: 0 hours 4 minutes 41 seconds  Findings:      The examined esophagus was normal.      Multiple dispersed, non-bleeding erosions were found in the gastric       antrum. This was biopsied with a cold forceps for histology.      The duodenal bulb and second portion of the duodenum were normal. Impression:               - Normal esophagus.                           - Non-bleeding erosive gastropathy. Biopsied.                           - Normal duodenal bulb and second portion of the                            duodenum. Moderate Sedation:      Moderate (conscious) sedation was administered by the endoscopy nurse       and supervised by the endoscopist. The following parameters were       monitored: oxygen saturation, heart rate, blood pressure, respiratory       rate, EKG, adequacy of pulmonary ventilation, and response to care.  Total physician intraservice time was 9 minutes. Recommendation:           - Patient has a contact number available for                            emergencies. The signs and symptoms of potential                            delayed complications were discussed with the                            patient. Return to normal activities tomorrow.                            Written discharge instructions were provided to the                            patient.                           - Resume previous diet.                           -  Return patient to hospital ward for ongoing care.                           - Resume previous diet.                           - Continue present medications. Follow up on                            pathology.                           - No repeat upper endoscopy.                           - Return to GI office (date not yet determined).                            See colonoscopy report. Procedure Code(s):        --- Professional ---                           [phone], Esophagogastroduodenoscopy, flexible,                            transoral; with biopsy, single or multiple Diagnosis Code(s):        --- Professional ---                           K31.89, Other diseases of stomach and duodenum                           R62.7, Adult failure to thrive CPT copyright 2016 American Medical Association. All rights reserved.  The codes documented in this report are preliminary and upon coder review may  be revised to meet current compliance requirements. Cristopher Estimable. Lucciano Vitali, MD Norvel Richards, MD 02/25/2016 3:20:05 PM This report has been signed electronically. Number of Addenda: 0

## 2016-02-25 NOTE — Clinical Social Work Placement (Addendum)
   CLINICAL SOCIAL WORK PLACEMENT  NOTE  Date:  02/25/2016  Patient Details  Name: Jackie Hickman MRN: [id number] Date of Birth: June 03, 1932  Clinical Social Work is seeking post-discharge placement for this patient at the [facility] level of care (*CSW will initial, date and re-position this form in  chart as items are completed):  Yes   Patient/family provided with [facility] Work Department's list of facilities offering this level of care within the geographic area requested by the patient (or if unable, by the patient's family).  Yes   Patient/family informed of their freedom to choose among providers that offer the needed level of care, that participate in Medicare, Medicaid or managed care program needed by the patient, have an available bed and are willing to accept the patient.  Yes   Patient/family informed of [facility]'s ownership interest in [facility] and [facility], as well as of the fact that they are under no obligation to receive care at these facilities.  PASRR submitted to EDS on 02/25/16     PASRR number received on 02/25/16     Existing PASRR number confirmed on       FL2 transmitted to all facilities in geographic area requested by pt/family on 02/25/16     FL2 transmitted to all facilities within larger geographic area on       Patient informed that his/her managed care company has contracts with or will negotiate with certain facilities, including the following:        Yes   Patient/family informed of bed offers received.  Patient chooses bed at [facility]     Physician recommends and patient chooses bed at      Patient to be transferred to [facility] on  .  Patient to be transferred to facility by       Patient family notified on   of transfer.  Name of family member notified:        PHYSICIAN       Additional Comment:  Facility aware of possible d/c tomorrow and can accept on holiday. If ready,  contact Hoyle Sauer in admissions.  _______________________________________________ Salome Arnt, [place] 02/25/2016, 11:54 AM [phone]

## 2016-02-25 NOTE — [place] FL2 (Signed)
[place] MEDICAID FL2 LEVEL OF CARE SCREENING TOOL     IDENTIFICATION  Patient Name: Jackie Hickman Birthdate: [birth year] Sex: female Admission Date (Current Location): 02/23/2016  [facility] and [place] Number:  [employer] and Address:  [facility] [address], [place]      Provider Number: [id number]  Attending Physician Name and Address:  Koleen Nimrod Acost*  Relative Name and Phone Number:       Current Level of Care: Hospital Recommended Level of Care: [place] Prior Approval Number:    Date Approved/Denied:   PASRR Number: [id number]  Discharge Plan: SNF    Current Diagnoses: Patient Active Problem List   Diagnosis Date Noted  . Hypomagnesemia 02/24/2016  . Normocytic anemia 02/23/2016  . Hypokalemia 02/23/2016  . Protein-calorie malnutrition ([place]) 02/23/2016  . Acute kidney injury superimposed on chronic kidney disease ([place]) 02/23/2016  . Alcohol abuse 02/23/2016  . Incontinence 02/23/2016  . BMI 26.0-26.9,adult 11/07/2014  . Essential hypertension 04/24/2013  . Type 2 diabetes mellitus with renal manifestations ([place]) 04/24/2013  . Hyperlipidemia 04/24/2013    Orientation RESPIRATION BLADDER Height & Weight     Self, Place  Normal Incontinent Weight: 157 lb 3 oz (71.3 kg) Height:  5\' 2"  (157.5 cm)  BEHAVIORAL SYMPTOMS/MOOD NEUROLOGICAL BOWEL NUTRITION STATUS  Other (Comment) (none)  (n/a) Incontinent Diet (NPO time specified. See d/c summary for updates)  AMBULATORY STATUS COMMUNICATION OF NEEDS Skin   Limited Assist Verbally Normal                       Personal Care Assistance Level of Assistance  Bathing, Feeding, Dressing Bathing Assistance: Limited assistance Feeding assistance: Limited assistance Dressing Assistance: Limited assistance     Functional Limitations Info  Sight, Hearing, Speech Sight Info: Impaired Hearing Info: Impaired Speech Info: Adequate    SPECIAL  CARE FACTORS FREQUENCY  PT (By licensed PT)     PT Frequency: 5              Contractures Contractures Info: Not present    Additional Factors Info  Insulin Sliding Scale Code Status Info: DNR Allergies Info: No known allergies           Current Medications (02/25/2016):  This is the current hospital active medication list Current Facility-Administered Medications  Medication Dose Route Frequency Provider Last Rate Last Dose  . 0.9 % NaCl with KCl 20 mEq/ L  infusion   Intravenous Continuous Rexene Alberts, MD 75 mL/hr at 02/24/16 0836    . acetaminophen (TYLENOL) tablet 650 mg  650 mg Oral Q6H PRN [name], MD       Or  . acetaminophen (TYLENOL) suppository 650 mg  650 mg Rectal Q6H PRN [name], MD      . amLODipine (NORVASC) tablet 10 mg  10 mg Oral Daily Rexene Alberts, MD   10 mg at 02/25/16 0837  . atorvastatin (LIPITOR) tablet 40 mg  40 mg Oral q1800 [name], MD   40 mg at 02/24/16 1713  . brimonidine (ALPHAGAN) 0.2 % ophthalmic solution 1 drop  1 drop Both Eyes Q12H [name], MD   1 drop at 02/25/16 0838   And  . timolol (TIMOPTIC) 0.5 % ophthalmic solution 1 drop  1 drop Both Eyes Q12H [name], MD   1 drop at 02/25/16 0837  . diclofenac sodium (VOLTAREN) 1 % transdermal gel 4 g  4 g Topical BID Rexene Alberts,  MD   4 g at 02/25/16 0838  . feeding supplement (GLUCERNA SHAKE) (GLUCERNA SHAKE) liquid 237 mL  237 mL Oral BID BM Rexene Alberts, MD   237 mL at 02/25/16 1000  . fenofibrate tablet 160 mg  160 mg Oral Daily [name], MD   160 mg at 02/25/16 0836  . folic acid (FOLVITE) tablet 1 mg  1 mg Oral Daily [name], MD   1 mg at 02/25/16 0836  . gabapentin (NEURONTIN) capsule 100 mg  100 mg Oral QHS Rexene Alberts, MD   100 mg at 02/24/16 2225  . hydrALAZINE (APRESOLINE) injection 10 mg  10 mg Intravenous Q4H PRN Rexene Alberts, MD      . HYDROcodone-acetaminophen (NORCO/VICODIN) 5-325 MG per tablet 1 tablet  1 tablet Oral Q6H PRN  [name], MD   1 tablet at 02/25/16 [phone]  . insulin aspart (novoLOG) injection 0-20 Units  0-20 Units Subcutaneous TID WC Rexene Alberts, MD   4 Units at 02/24/16 1644  . insulin aspart (novoLOG) injection 0-5 Units  0-5 Units Subcutaneous QHS Rexene Alberts, MD      . insulin glargine (LANTUS) injection 20 Units  20 Units Subcutaneous QHS Rexene Alberts, MD   20 Units at 02/24/16 2224  . lactated ringers infusion   Intravenous Continuous [name], MD 50 mL/hr at 02/23/16 2359    . latanoprost (XALATAN) 0.005 % ophthalmic solution 1 drop  1 drop Both Eyes QHS [name], MD   1 drop at 02/24/16 2224  . LORazepam (ATIVAN) tablet 1 mg  1 mg Oral Q6H PRN [name], MD       Or  . LORazepam (ATIVAN) injection 1 mg  1 mg Intravenous Q6H PRN [name], MD      . multivitamin with minerals tablet 1 tablet  1 tablet Oral Daily [name], MD   1 tablet at 02/25/16 0837  . nebivolol (BYSTOLIC) tablet 20 mg  20 mg Oral Daily [name], MD   20 mg at 02/24/16 1058  . ondansetron (ZOFRAN) tablet 4 mg  4 mg Oral Q6H PRN [name], MD       Or  . ondansetron [facility]) injection 4 mg  4 mg Intravenous Q6H PRN [name], MD      . pantoprazole (PROTONIX) EC tablet 40 mg  40 mg Oral Daily Annitta Needs, NP   40 mg at 02/25/16 [id number]  . sodium chloride flush (NS) 0.9 % injection 3 mL  3 mL Intravenous Q12H [name], MD   3 mL at 02/25/16 1000  . thiamine (VITAMIN B-1) tablet 100 mg  100 mg Oral Daily [name], MD   100 mg at 02/25/16 [id number]   Or  . thiamine (B-1) injection 100 mg  100 mg Intravenous Daily [name], MD         Discharge Medications: Please see discharge summary for a list of discharge medications.  Relevant Imaging Results:  Relevant Lab Results:   Additional Information SSN: [id number]  Salome Arnt, [place]

## 2016-02-25 NOTE — Care Management Note (Signed)
Case Management Note  Patient Details  Name: Jackie Hickman MRN: [id number] Date of Birth: 1932/09/13  Subjective/Objective:                  Pt from home with family. Pt has recommended SNF. CSW is aware and has arranged for SNF placement. Anticipate DC hon 02/26/2016.  Action/Plan: No CM needs anticipated.   Expected Discharge Date:      02/26/2016            Expected Discharge Plan:  [place]  In-House Referral:  Clinical Social Work  Discharge planning Services  CM Consult  Post Acute Care Choice:  NA Choice offered to:  NA  Status of Service:  Completed, signed off Sherald Barge, RN 02/25/2016, 2:04 PM

## 2016-02-26 DIAGNOSIS — E876 Hypokalemia: Secondary | ICD-10-CM | POA: Diagnosis not present

## 2016-02-26 DIAGNOSIS — H409 Unspecified glaucoma: Secondary | ICD-10-CM | POA: Diagnosis not present

## 2016-02-26 DIAGNOSIS — M5135 Other intervertebral disc degeneration, thoracolumbar region: Secondary | ICD-10-CM | POA: Diagnosis not present

## 2016-02-26 DIAGNOSIS — E7800 Pure hypercholesterolemia, unspecified: Secondary | ICD-10-CM | POA: Diagnosis not present

## 2016-02-26 DIAGNOSIS — D649 Anemia, unspecified: Secondary | ICD-10-CM | POA: Diagnosis not present

## 2016-02-26 DIAGNOSIS — N181 Chronic kidney disease, stage 1: Secondary | ICD-10-CM | POA: Diagnosis not present

## 2016-02-26 DIAGNOSIS — M6281 Muscle weakness (generalized): Secondary | ICD-10-CM | POA: Diagnosis not present

## 2016-02-26 DIAGNOSIS — M8580 Other specified disorders of bone density and structure, unspecified site: Secondary | ICD-10-CM | POA: Diagnosis not present

## 2016-02-26 DIAGNOSIS — D638 Anemia in other chronic diseases classified elsewhere: Secondary | ICD-10-CM | POA: Diagnosis not present

## 2016-02-26 DIAGNOSIS — Z7401 Bed confinement status: Secondary | ICD-10-CM | POA: Diagnosis not present

## 2016-02-26 DIAGNOSIS — I10 Essential (primary) hypertension: Secondary | ICD-10-CM | POA: Diagnosis not present

## 2016-02-26 DIAGNOSIS — E785 Hyperlipidemia, unspecified: Secondary | ICD-10-CM | POA: Diagnosis not present

## 2016-02-26 DIAGNOSIS — R279 Unspecified lack of coordination: Secondary | ICD-10-CM | POA: Diagnosis not present

## 2016-02-26 DIAGNOSIS — E441 Mild protein-calorie malnutrition: Secondary | ICD-10-CM | POA: Diagnosis not present

## 2016-02-26 DIAGNOSIS — K219 Gastro-esophageal reflux disease without esophagitis: Secondary | ICD-10-CM | POA: Diagnosis not present

## 2016-02-26 DIAGNOSIS — N179 Acute kidney failure, unspecified: Secondary | ICD-10-CM | POA: Diagnosis not present

## 2016-02-26 DIAGNOSIS — E119 Type 2 diabetes mellitus without complications: Secondary | ICD-10-CM | POA: Diagnosis not present

## 2016-02-26 DIAGNOSIS — E875 Hyperkalemia: Secondary | ICD-10-CM | POA: Diagnosis not present

## 2016-02-26 DIAGNOSIS — E782 Mixed hyperlipidemia: Secondary | ICD-10-CM | POA: Diagnosis not present

## 2016-02-26 DIAGNOSIS — Z79899 Other long term (current) drug therapy: Secondary | ICD-10-CM | POA: Diagnosis not present

## 2016-02-26 DIAGNOSIS — M109 Gout, unspecified: Secondary | ICD-10-CM | POA: Diagnosis not present

## 2016-02-26 DIAGNOSIS — D518 Other vitamin B12 deficiency anemias: Secondary | ICD-10-CM | POA: Diagnosis not present

## 2016-02-26 LAB — BASIC METABOLIC PANEL
Anion gap: 9 (ref 5–15)
BUN: 38 mg/dL — AB (ref 6–20)
CHLORIDE: 105 mmol/L (ref 101–111)
CO2: 26 mmol/L (ref 22–32)
CREATININE: 2.22 mg/dL — AB (ref 0.44–1.00)
Calcium: 8.4 mg/dL — ABNORMAL LOW (ref 8.9–10.3)
GFR calc Af Amer: 23 mL/min — ABNORMAL LOW (ref >60)
GFR calc non Af Amer: 20 mL/min — ABNORMAL LOW (ref >60)
Glucose, Bld: 101 mg/dL — ABNORMAL HIGH (ref 65–99)
Potassium: 2.9 mmol/L — ABNORMAL LOW (ref 3.5–5.1)
Sodium: 140 mmol/L (ref 135–145)

## 2016-02-26 LAB — GLUCOSE, CAPILLARY
GLUCOSE-CAPILLARY: 92 mg/dL (ref 65–99)
GLUCOSE-CAPILLARY: 120 mg/dL — AB (ref 65–99)
GLUCOSE-CAPILLARY: 222 mg/dL — AB (ref 65–99)

## 2016-02-26 LAB — CBC
HCT: 26.6 % — ABNORMAL LOW (ref 36.0–46.0)
Hemoglobin: 8.6 g/dL — ABNORMAL LOW (ref 12.0–15.0)
MCH: 29.3 pg (ref 26.0–34.0)
MCHC: 32.3 g/dL (ref 30.0–36.0)
MCV: 90.5 fL (ref 78.0–100.0)
PLATELETS: 390 10*3/uL (ref 150–400)
RBC: 2.94 MIL/uL — ABNORMAL LOW (ref 3.87–5.11)
RDW: 16.2 % — AB (ref 11.5–15.5)
WBC: 10.9 10*3/uL — ABNORMAL HIGH (ref 4.0–10.5)

## 2016-02-26 MED ORDER — POTASSIUM CHLORIDE CRYS ER 20 MEQ PO TBCR
40.0000 meq | EXTENDED_RELEASE_TABLET | Freq: Once | ORAL | Status: AC
  Administered 2016-02-26: 40 meq via ORAL
  Filled 2016-02-26: qty 2

## 2016-02-26 MED ORDER — HYDROCODONE-ACETAMINOPHEN 5-325 MG PO TABS: ORAL_TABLET | ORAL | 0 refills | Status: DC

## 2016-02-26 MED ORDER — PANTOPRAZOLE SODIUM 40 MG PO TBEC: 40.0000 mg | DELAYED_RELEASE_TABLET | Freq: Every day | ORAL | 2 refills | Status: DC

## 2016-02-26 NOTE — Progress Notes (Addendum)
Clinical Education officer, museum (CSW) received a call from [employer] stating that patient is ready for D/C today. Per [facility] admissions coordinator at [facility] patient can come today to room 415-B. CSW sent D/C orders to [facility]. CSW contacted patient's daughter Lorna Dibble and made her aware of above. RN is getting DNR and norco RX signed to send with patient. CSW will arrange EMS when RN notifies CSW everything is ready.   CSW called [facility] for transport at 5:47 pm.   [facility], LCSW [phone]

## 2016-02-26 NOTE — Progress Notes (Signed)
  Report called to [facility] 

## 2016-02-26 NOTE — Discharge Summary (Signed)
Physician Discharge Summary  Jackie Hickman [id number] DOB: 1932/12/29 DOA: 02/23/2016  PCP: [name], FNP  Admit date: 02/23/2016 Discharge date: 02/26/2016  Time spent: 45 minutes  Recommendations for Outpatient Follow-up:  -Will be discharged back to SNF today.   Discharge Diagnoses:  Principal Problem:   Normocytic anemia Active Problems:   Essential hypertension   Type 2 diabetes mellitus with renal manifestations ([place])   Hyperlipidemia   Hypokalemia   Protein-calorie malnutrition ([place])   Acute kidney injury superimposed on chronic kidney disease ([place])   Alcohol abuse   Incontinence   Hypomagnesemia   Discharge Condition: Stable and improved  Filed Weights   02/23/16 1138 02/23/16 1805 02/25/16 1429  Weight: 67.1 kg (148 lb) 71.3 kg (157 lb 3 oz) 71.3 kg (157 lb 3 oz)    History of present illness:  As per Dr. Lorin Mercy on 11/20: Jackie Hickman is a 82 y.o. female with medical history significant of DM, HTN, HLD, gout, and glaucoma presenting with having pain in her leg.  R leg, pain and numbness in her foot.  Was seen in ER last Sunday (Nov 13) and was given medication for pain (Vicodin).  Unable to stand up and walk, both feet just hurt so bad and swollen on top.  Numbness would come and go since then.  Nothing seemed to make it better or worse.  Unable to stand at all, which is different for her.   A week ago started with urine and fecal incontinence - too slow at getting up to make it successfully to the bathroom.    +urgency - urinary and fecal.   Previously with mild occasional fecal incontinence, small squirts of BM with urination.  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  Memory loss, progressive.  Occasional cough.  Some LE edema.  Some tingling intermittently.  Weight loss - 170-210 usually, 148 today..  Anorexia.  Early satiety.  H/o colon polyps. Last C-scope was last year.  Last  report was in 2014.  No h/o EGD.   Renal US in 0000000 - uncertain reason.    There was a lot of discussion about the patient's alcohol intake.  Initially, the patient reported very intermittent use.  However, she lives with her alcoholic son and appears to be drinking regularly, up to 3-5 drinks/day.   ED Course: Prednisone 40 mg PO x 1; Dilaudid 0.75 mg IV x 1; MagOx 400 mg PO x 1; NS with 20 mEq/L x 500 cc; transfusion 2 units prbc  Hospital Course:   Severe normocytic anemia. The patient's hemoglobin was 5.7 on admission. Apparently her stool was guaiac negative in the ED. Her hemoglobin 03/2015 was 8.8. The etiology of her anemia is unclear. -She was transfused a total of 3 units of packed red blood cells following admission. Her hemoglobin improved appropriately and has been stable since. -EGD/colonoscopy without source of bleeding. -Protonix was added daily. Naproxen was discontinued.  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 Patient's creatinine was 2.43 on admission, down to 2.34 on 11/22, 2.22 on DC Her baseline creatinine appears to be around 1.2. -She was started on gentle IV fluids for hydration. HCTZ and Cozaar were withheld. -Her creatinine is improving, but not back at baseline. Wonder if this is a new baseline for her. -Advised to continue aggressive PO fluids at SNF.  Essential hypertension. Patient is treated chronically with amlodipine, bystolic. HCTZ,  and losartan. HCTZ and losartan are being held due to AKI. Her blood pressure was uncontrolled. -Continue bystolic and Norvasc, but will increase the dose to 10 mg daily. We'll continue necessary hydralazine IV. -Do not anticipate further medication changes during this hospitalization.  Hypokalemia and hypomagnesemia. Patient's serum potassium was 2.9 on DC. Will give 40 meq prior to DC. and her magnesium was 1.2 on admission.  -We'll continue to monitor and continue  repletion/supplementation as needed.  Type 2 diabetes mellitus. Patient is treated chronically with metformin. It is being held in the setting of renal dysfunction. Sliding scale NovoLog was started. -We'll change sliding scale NovoLog to resistant scale and add Lantus. -Hemoglobin A1c ordered and is pending.  Alcohol abuse.  Patient admits to drinking about a private of gin weekly. She was strongly advised to stop drinking indefinitely. CIWA protocol was started. She shows no signs of alcohol withdrawal syndrome.    Procedures: EGD: Impression:               - Normal esophagus.                           - Non-bleeding erosive gastropathy. Biopsied.                           - Normal duodenal bulb and second portion of the                             duodenum. Colonoscopy : Impression:               - One 5 mm polyp in the cecum, removed with a cold                            snare. Resected and retrieved.                           - The examination was otherwise normal on direct                             and retroflexion views.  Consultations:  GI  Discharge Instructions  Discharge Instructions    Diet - low sodium heart healthy    Complete by:  As directed    Increase activity slowly    Complete by:  As directed        Medication List    STOP taking these medications   hydrochlorothiazide 25 MG tablet Commonly known as:  HYDRODIURIL   losartan 100 MG tablet Commonly known as:  COZAAR   metFORMIN 1000 MG tablet Commonly known as:  GLUCOPHAGE   naproxen 500 MG tablet Commonly known as:  NAPROSYN     TAKE these medications   amLODipine 5 MG tablet Commonly known as:  NORVASC Take 1 tablet (5 mg total) by mouth daily.   atorvastatin 40 MG tablet Commonly known as:  LIPITOR Take 1 tablet (40 mg total) by mouth daily.   COMBIGAN 0.2-0.5 % ophthalmic solution Generic drug:  brimonidine-timolol Place 1 drop into both eyes every 12 (twelve) hours.     fenofibrate 145 MG tablet Commonly known as:  TRICOR Take 1 tablet (145 mg total) by mouth daily.   HYDROcodone-acetaminophen 5-325 MG tablet Commonly known as:  NORCO/VICODIN 1 tab PO q12 hours prn pain   latanoprost 0.005 % ophthalmic solution Commonly known as:  XALATAN Place 1 drop into both eyes at bedtime.   nebivolol 10 MG tablet Commonly known as:  BYSTOLIC Take 2 tablets (20 mg total) by mouth daily.   pantoprazole 40 MG tablet Commonly known as:  PROTONIX Take 1 tablet (40 mg total) by mouth daily. Start taking on:  02/27/2016      No Known Allergies    The results of significant diagnostics from this hospitalization (including imaging, microbiology, ancillary and laboratory) are listed below for reference.    Significant Diagnostic Studies: Dg Cervical Spine 1 View  Result Date: 02/24/2016 CLINICAL DATA:  Neck pain. EXAM: CERVICAL SPINE 1 VIEW COMPARISON:  No recent prior . FINDINGS: Multilevel degenerative change. No acute abnormality identified. No evidence of fracture dislocation. Posterior cervical wiring C6-C7 noted. IMPRESSION: Postsurgical change cervical spine. Diffuse degenerative change. No acute bony abnormality identified. No evidence of fracture or dislocation. Electronically Signed   By: Marcello Moores  Register   On: 02/24/2016 11:59   Dg Lumbar Spine 2-3 Views  Result Date: 02/24/2016 CLINICAL DATA:  Right lower leg pain EXAM: LUMBAR SPINE - 2-3 VIEW COMPARISON:  None available FINDINGS: Bones are osteopenic. Normal alignment. Preserved vertebral body heights. Diffuse lower thoracic and lumbar degenerative spondylosis at all levels with disc space narrowing, sclerosis and endplate osteophytes. Lower lumbar facet arthropathy at L5-S1. Transitional S1 segment noted. Pedicles appear intact. Normal SI joints for age. Aortoiliac atherosclerosis noted. Bowel gas pattern is normal.  Remote cholecystectomy evident. IMPRESSION: Diffuse thoracolumbar degenerative  spondylosis without definite acute osseous finding or malalignment by plain radiography. Aortoiliac atherosclerosis Electronically Signed   By: Jerilynn Mages.  Shick M.D.   On: 02/24/2016 08:17   US Abdomen Complete  Result Date: 02/24/2016 CLINICAL DATA:  Nonspecific pain and incontinence EXAM: ABDOMEN ULTRASOUND COMPLETE COMPARISON:  None. FINDINGS: Gallbladder: Not visualized and suspected absent. Common bile duct: Diameter: 5 mm. There is no intrahepatic, common hepatic, or common bile duct dilatation. Liver: No focal lesion identified. Within normal limits in parenchymal echogenicity. IVC: No abnormality visualized. Pancreas: No mass or inflammatory focus. Spleen: Size and appearance within normal limits. Right Kidney: Length: 9.4 cm. Echogenicity within normal limits. No mass or hydronephrosis visualized. Left Kidney: Length: 9.7 cm. Echogenicity within normal limits. No mass or hydronephrosis visualized. Abdominal aorta: No aneurysm visualized. Other findings: No demonstrable ascites. IMPRESSION: Gallbladder not visualized and presumed absent. Study otherwise unremarkable. Electronically Signed   By: Lowella Grip III M.D.   On: 02/24/2016 08:42   Dg Foot Complete Left  Result Date: 02/15/2016 CLINICAL DATA:  Left foot pain without trauma.  History of gout. EXAM: LEFT FOOT - COMPLETE 3+ VIEW COMPARISON:  None. FINDINGS: Diffuse osteopenia. No fractures. Small plantar spur. Small erosion at the distal aspect of the first proximal phalanx is probably not changed given difference in technique. IMPRESSION: No acute interval change. Electronically Signed   By: Dorise Bullion III M.D   On: 02/15/2016 15:46    Microbiology: No results found for this or any previous visit (from the past 240 hour(s)).   Labs: Basic Metabolic Panel:  Recent Labs Lab 02/23/16 1208 02/24/16 0542 02/24/16 0600 02/25/16 0621 02/26/16 0549  NA 141 141  --  139 140  K 2.8* 2.6*  --  3.3* 2.9*  CL 99* 104  --  105 105    CO2 29 28  --  25 26  GLUCOSE 170* 286*  --  116* 101*  BUN 37* 40*  --  43* 38*  CREATININE 2.43* 2.10*  --  2.34* 2.22*  CALCIUM 7.2* 6.7*  --  7.6* 8.4*  MG 1.2*  --  1.2* 1.9  --    Liver Function Tests:  Recent Labs Lab 02/23/16 1208  AST 18  ALT 9*  ALKPHOS 63  BILITOT 0.6  PROT 7.5  ALBUMIN 2.6*   No results for input(s): LIPASE, AMYLASE in the last 168 hours. No results for input(s): AMMONIA in the last 168 hours. CBC:  Recent Labs Lab 02/23/16 1208 02/24/16 0542 02/24/16 0546 02/24/16 1957 02/25/16 0621 02/26/16 0549  WBC 11.6* 11.6*  --   --  10.9* 10.9*  NEUTROABS 9.7*  --   --   --   --   --   HGB 5.7* 7.5*  --  8.5* 8.3* 8.6*  HCT 18.0* 22.6* 23.0* 25.6* 24.9* 26.6*  MCV 90.0 86.9  --   --  88.0 90.5  PLT 340 324  --   --  356 390   Cardiac Enzymes: No results for input(s): CKTOTAL, CKMB, CKMBINDEX, TROPONINI in the last 168 hours. BNP: BNP (last 3 results) No results for input(s): BNP in the last 8760 hours.  ProBNP (last 3 results) No results for input(s): PROBNP in the last 8760 hours.  CBG:  Recent Labs Lab 02/25/16 1526 02/25/16 1645 02/25/16 2116 02/26/16 0748 02/26/16 1126  GLUCAP 78 73 99 92 120*       Signed:  HERNANDEZ ACOSTA,Richad Ramsay  [facility] Pager: [phone] 02/26/2016, 3:25 PM

## 2016-02-26 NOTE — Clinical Social Work Placement (Signed)
   CLINICAL SOCIAL WORK PLACEMENT  NOTE  Date:  02/26/2016  Patient Details  Name: Jackie Hickman MRN: [id number] Date of Birth: 08/14/1932  Clinical Social Work is seeking post-discharge placement for this patient at the [place] level of care (*CSW will initial, date and re-position this form in  chart as items are completed):  Yes   Patient/family provided with [place] Work Department's list of facilities offering this level of care within the geographic area requested by the patient (or if unable, by the patient's family).  Yes   Patient/family informed of their freedom to choose among providers that offer the needed level of care, that participate in Medicare, Medicaid or managed care program needed by the patient, have an available bed and are willing to accept the patient.  Yes   Patient/family informed of [place]'s ownership interest in [facility] and [facility], as well as of the fact that they are under no obligation to receive care at these facilities.  PASRR submitted to EDS on 02/25/16     PASRR number received on 02/25/16     Existing PASRR number confirmed on       FL2 transmitted to all facilities in geographic area requested by pt/family on 02/25/16     FL2 transmitted to all facilities within larger geographic area on       Patient informed that his/her managed care company has contracts with or will negotiate with certain facilities, including the following:        Yes   Patient/family informed of bed offers received.  Patient chooses bed at [facility]     Physician recommends and patient chooses bed at      Patient to be transferred to [facility] on 02/26/16.  Patient to be transferred to facility by  [facility] )     Patient family notified on 02/26/16 of transfer.  Name of family member notified:   (Patient's daughter Jackie Hickman is aware of D/C today. )     PHYSICIAN       Additional  Comment:    _______________________________________________ Carolyna Yerian, Veronia Beets, LCSW 02/26/2016, 3:48 PM

## 2016-02-26 NOTE — Progress Notes (Signed)
EMS here to take pt to [facility].

## 2016-03-01 DIAGNOSIS — M109 Gout, unspecified: Secondary | ICD-10-CM | POA: Diagnosis not present

## 2016-03-01 DIAGNOSIS — E875 Hyperkalemia: Secondary | ICD-10-CM | POA: Diagnosis not present

## 2016-03-01 DIAGNOSIS — D649 Anemia, unspecified: Secondary | ICD-10-CM | POA: Diagnosis not present

## 2016-03-01 DIAGNOSIS — E119 Type 2 diabetes mellitus without complications: Secondary | ICD-10-CM | POA: Diagnosis not present

## 2016-03-02 ENCOUNTER — Encounter (HOSPITAL_COMMUNITY): Payer: Self-pay | Admitting: Internal Medicine

## 2016-03-04 ENCOUNTER — Encounter: Payer: Self-pay | Admitting: Internal Medicine

## 2016-03-17 DIAGNOSIS — D638 Anemia in other chronic diseases classified elsewhere: Secondary | ICD-10-CM | POA: Diagnosis not present

## 2016-03-17 DIAGNOSIS — E119 Type 2 diabetes mellitus without complications: Secondary | ICD-10-CM | POA: Diagnosis not present

## 2016-03-17 DIAGNOSIS — N181 Chronic kidney disease, stage 1: Secondary | ICD-10-CM | POA: Diagnosis not present

## 2016-04-05 DIAGNOSIS — D649 Anemia, unspecified: Secondary | ICD-10-CM | POA: Diagnosis not present

## 2016-04-05 DIAGNOSIS — E46 Unspecified protein-calorie malnutrition: Secondary | ICD-10-CM | POA: Diagnosis not present

## 2016-04-05 DIAGNOSIS — N181 Chronic kidney disease, stage 1: Secondary | ICD-10-CM | POA: Diagnosis not present

## 2016-04-06 ENCOUNTER — Telehealth: Payer: Self-pay

## 2016-04-06 ENCOUNTER — Encounter: Payer: Self-pay | Admitting: Internal Medicine

## 2016-04-06 NOTE — Telephone Encounter (Signed)
Per RMR-  Send letter to patient.  Send copy of letter with path to referring provider and PCP.   Offer a f/u appt wn next month w extender re: FTT.

## 2016-04-06 NOTE — Telephone Encounter (Signed)
Letter mailed to the pt. 

## 2016-04-06 NOTE — Telephone Encounter (Signed)
APPT MADE

## 2016-05-10 ENCOUNTER — Ambulatory Visit: Payer: Medicare Other | Admitting: Gastroenterology

## 2016-05-10 ENCOUNTER — Encounter: Payer: Self-pay | Admitting: Gastroenterology

## 2016-05-10 ENCOUNTER — Telehealth: Payer: Self-pay | Admitting: Gastroenterology

## 2016-05-10 NOTE — Telephone Encounter (Signed)
PATIENT WAS A NO SHOW AND LETTER SENT  °

## 2016-06-15 DIAGNOSIS — I10 Essential (primary) hypertension: Secondary | ICD-10-CM | POA: Diagnosis not present

## 2016-06-15 DIAGNOSIS — K219 Gastro-esophageal reflux disease without esophagitis: Secondary | ICD-10-CM | POA: Diagnosis not present

## 2016-06-15 DIAGNOSIS — M6281 Muscle weakness (generalized): Secondary | ICD-10-CM | POA: Diagnosis not present

## 2016-07-26 DIAGNOSIS — D649 Anemia, unspecified: Secondary | ICD-10-CM | POA: Diagnosis not present

## 2016-07-27 DIAGNOSIS — D649 Anemia, unspecified: Secondary | ICD-10-CM | POA: Diagnosis not present

## 2016-07-28 DIAGNOSIS — D649 Anemia, unspecified: Secondary | ICD-10-CM | POA: Diagnosis not present

## 2016-07-29 DIAGNOSIS — D649 Anemia, unspecified: Secondary | ICD-10-CM | POA: Diagnosis not present

## 2016-07-30 DIAGNOSIS — D649 Anemia, unspecified: Secondary | ICD-10-CM | POA: Diagnosis not present

## 2016-08-02 DIAGNOSIS — D649 Anemia, unspecified: Secondary | ICD-10-CM | POA: Diagnosis not present

## 2016-08-03 DIAGNOSIS — D649 Anemia, unspecified: Secondary | ICD-10-CM | POA: Diagnosis not present

## 2016-08-04 DIAGNOSIS — E7800 Pure hypercholesterolemia, unspecified: Secondary | ICD-10-CM | POA: Diagnosis not present

## 2016-08-04 DIAGNOSIS — D649 Anemia, unspecified: Secondary | ICD-10-CM | POA: Diagnosis not present

## 2016-08-04 DIAGNOSIS — E782 Mixed hyperlipidemia: Secondary | ICD-10-CM | POA: Diagnosis not present

## 2016-08-05 DIAGNOSIS — D649 Anemia, unspecified: Secondary | ICD-10-CM | POA: Diagnosis not present

## 2016-08-06 DIAGNOSIS — I10 Essential (primary) hypertension: Secondary | ICD-10-CM | POA: Diagnosis not present

## 2016-08-06 DIAGNOSIS — N182 Chronic kidney disease, stage 2 (mild): Secondary | ICD-10-CM | POA: Diagnosis not present

## 2016-08-06 DIAGNOSIS — D649 Anemia, unspecified: Secondary | ICD-10-CM | POA: Diagnosis not present

## 2016-08-09 DIAGNOSIS — D649 Anemia, unspecified: Secondary | ICD-10-CM | POA: Diagnosis not present

## 2016-08-10 DIAGNOSIS — D649 Anemia, unspecified: Secondary | ICD-10-CM | POA: Diagnosis not present

## 2016-08-12 DIAGNOSIS — D649 Anemia, unspecified: Secondary | ICD-10-CM | POA: Diagnosis not present

## 2016-08-13 DIAGNOSIS — D649 Anemia, unspecified: Secondary | ICD-10-CM | POA: Diagnosis not present

## 2016-08-15 DIAGNOSIS — D649 Anemia, unspecified: Secondary | ICD-10-CM | POA: Diagnosis not present

## 2016-08-16 DIAGNOSIS — D649 Anemia, unspecified: Secondary | ICD-10-CM | POA: Diagnosis not present

## 2016-08-17 DIAGNOSIS — D649 Anemia, unspecified: Secondary | ICD-10-CM | POA: Diagnosis not present

## 2016-08-18 DIAGNOSIS — D649 Anemia, unspecified: Secondary | ICD-10-CM | POA: Diagnosis not present

## 2016-08-19 DIAGNOSIS — D649 Anemia, unspecified: Secondary | ICD-10-CM | POA: Diagnosis not present

## 2016-08-23 DIAGNOSIS — D649 Anemia, unspecified: Secondary | ICD-10-CM | POA: Diagnosis not present

## 2016-08-24 DIAGNOSIS — M5135 Other intervertebral disc degeneration, thoracolumbar region: Secondary | ICD-10-CM | POA: Diagnosis not present

## 2016-08-24 DIAGNOSIS — M6281 Muscle weakness (generalized): Secondary | ICD-10-CM | POA: Diagnosis not present

## 2016-08-24 DIAGNOSIS — I10 Essential (primary) hypertension: Secondary | ICD-10-CM | POA: Diagnosis not present

## 2016-08-24 DIAGNOSIS — D649 Anemia, unspecified: Secondary | ICD-10-CM | POA: Diagnosis not present

## 2016-08-25 DIAGNOSIS — D649 Anemia, unspecified: Secondary | ICD-10-CM | POA: Diagnosis not present

## 2016-08-26 DIAGNOSIS — D649 Anemia, unspecified: Secondary | ICD-10-CM | POA: Diagnosis not present

## 2016-08-27 DIAGNOSIS — D649 Anemia, unspecified: Secondary | ICD-10-CM | POA: Diagnosis not present

## 2016-08-30 DIAGNOSIS — D649 Anemia, unspecified: Secondary | ICD-10-CM | POA: Diagnosis not present

## 2016-08-31 DIAGNOSIS — D649 Anemia, unspecified: Secondary | ICD-10-CM | POA: Diagnosis not present

## 2016-09-01 DIAGNOSIS — D649 Anemia, unspecified: Secondary | ICD-10-CM | POA: Diagnosis not present

## 2016-09-02 DIAGNOSIS — D649 Anemia, unspecified: Secondary | ICD-10-CM | POA: Diagnosis not present

## 2016-09-03 DIAGNOSIS — D649 Anemia, unspecified: Secondary | ICD-10-CM | POA: Diagnosis not present

## 2016-09-06 DIAGNOSIS — D649 Anemia, unspecified: Secondary | ICD-10-CM | POA: Diagnosis not present

## 2016-09-07 DIAGNOSIS — D649 Anemia, unspecified: Secondary | ICD-10-CM | POA: Diagnosis not present

## 2016-09-08 DIAGNOSIS — D649 Anemia, unspecified: Secondary | ICD-10-CM | POA: Diagnosis not present

## 2016-09-09 DIAGNOSIS — D649 Anemia, unspecified: Secondary | ICD-10-CM | POA: Diagnosis not present

## 2016-09-10 DIAGNOSIS — D649 Anemia, unspecified: Secondary | ICD-10-CM | POA: Diagnosis not present

## 2016-09-13 DIAGNOSIS — D649 Anemia, unspecified: Secondary | ICD-10-CM | POA: Diagnosis not present

## 2016-09-14 DIAGNOSIS — D649 Anemia, unspecified: Secondary | ICD-10-CM | POA: Diagnosis not present

## 2016-09-15 DIAGNOSIS — D649 Anemia, unspecified: Secondary | ICD-10-CM | POA: Diagnosis not present

## 2016-09-15 DIAGNOSIS — E119 Type 2 diabetes mellitus without complications: Secondary | ICD-10-CM | POA: Diagnosis not present

## 2016-09-15 DIAGNOSIS — Z79899 Other long term (current) drug therapy: Secondary | ICD-10-CM | POA: Diagnosis not present

## 2016-09-16 DIAGNOSIS — D649 Anemia, unspecified: Secondary | ICD-10-CM | POA: Diagnosis not present

## 2016-09-17 DIAGNOSIS — D649 Anemia, unspecified: Secondary | ICD-10-CM | POA: Diagnosis not present

## 2016-09-20 DIAGNOSIS — E119 Type 2 diabetes mellitus without complications: Secondary | ICD-10-CM | POA: Diagnosis not present

## 2016-09-20 DIAGNOSIS — D649 Anemia, unspecified: Secondary | ICD-10-CM | POA: Diagnosis not present

## 2016-09-21 DIAGNOSIS — D649 Anemia, unspecified: Secondary | ICD-10-CM | POA: Diagnosis not present

## 2016-09-22 DIAGNOSIS — D649 Anemia, unspecified: Secondary | ICD-10-CM | POA: Diagnosis not present

## 2016-09-23 DIAGNOSIS — D649 Anemia, unspecified: Secondary | ICD-10-CM | POA: Diagnosis not present

## 2016-09-24 DIAGNOSIS — D649 Anemia, unspecified: Secondary | ICD-10-CM | POA: Diagnosis not present

## 2016-09-27 DIAGNOSIS — D649 Anemia, unspecified: Secondary | ICD-10-CM | POA: Diagnosis not present

## 2016-09-28 DIAGNOSIS — D649 Anemia, unspecified: Secondary | ICD-10-CM | POA: Diagnosis not present

## 2016-09-29 DIAGNOSIS — D649 Anemia, unspecified: Secondary | ICD-10-CM | POA: Diagnosis not present

## 2016-09-30 DIAGNOSIS — D649 Anemia, unspecified: Secondary | ICD-10-CM | POA: Diagnosis not present

## 2016-10-01 DIAGNOSIS — D649 Anemia, unspecified: Secondary | ICD-10-CM | POA: Diagnosis not present

## 2016-10-04 DIAGNOSIS — D649 Anemia, unspecified: Secondary | ICD-10-CM | POA: Diagnosis not present

## 2016-10-05 DIAGNOSIS — D649 Anemia, unspecified: Secondary | ICD-10-CM | POA: Diagnosis not present

## 2016-10-07 DIAGNOSIS — D649 Anemia, unspecified: Secondary | ICD-10-CM | POA: Diagnosis not present

## 2016-10-08 DIAGNOSIS — D649 Anemia, unspecified: Secondary | ICD-10-CM | POA: Diagnosis not present

## 2016-10-09 DIAGNOSIS — D649 Anemia, unspecified: Secondary | ICD-10-CM | POA: Diagnosis not present

## 2016-10-11 DIAGNOSIS — D649 Anemia, unspecified: Secondary | ICD-10-CM | POA: Diagnosis not present

## 2016-10-12 DIAGNOSIS — D649 Anemia, unspecified: Secondary | ICD-10-CM | POA: Diagnosis not present

## 2016-10-13 DIAGNOSIS — D649 Anemia, unspecified: Secondary | ICD-10-CM | POA: Diagnosis not present

## 2016-10-14 DIAGNOSIS — D649 Anemia, unspecified: Secondary | ICD-10-CM | POA: Diagnosis not present

## 2016-10-15 DIAGNOSIS — D649 Anemia, unspecified: Secondary | ICD-10-CM | POA: Diagnosis not present

## 2016-10-22 DIAGNOSIS — E119 Type 2 diabetes mellitus without complications: Secondary | ICD-10-CM | POA: Diagnosis not present

## 2016-10-22 DIAGNOSIS — Z79899 Other long term (current) drug therapy: Secondary | ICD-10-CM | POA: Diagnosis not present

## 2016-11-26 DIAGNOSIS — M6281 Muscle weakness (generalized): Secondary | ICD-10-CM | POA: Diagnosis not present

## 2016-11-26 DIAGNOSIS — I10 Essential (primary) hypertension: Secondary | ICD-10-CM | POA: Diagnosis not present

## 2016-11-26 DIAGNOSIS — M5135 Other intervertebral disc degeneration, thoracolumbar region: Secondary | ICD-10-CM | POA: Diagnosis not present

## 2016-12-09 DIAGNOSIS — K219 Gastro-esophageal reflux disease without esophagitis: Secondary | ICD-10-CM | POA: Diagnosis not present

## 2016-12-09 DIAGNOSIS — M6281 Muscle weakness (generalized): Secondary | ICD-10-CM | POA: Diagnosis not present

## 2016-12-09 DIAGNOSIS — I10 Essential (primary) hypertension: Secondary | ICD-10-CM | POA: Diagnosis not present

## 2016-12-27 DIAGNOSIS — E119 Type 2 diabetes mellitus without complications: Secondary | ICD-10-CM | POA: Diagnosis not present

## 2017-09-22 ENCOUNTER — Other Ambulatory Visit: Payer: Self-pay

## 2017-09-22 NOTE — Patient Outreach (Signed)
Jackie Hickman) Care Management  09/22/2017  Jackie Hickman Jul 12, 1932 [id number]   Medication Adherence call to Jackie Hickman patient's telephone number under [facility] is disconnected Epic has a number but can not leave messages patient is due on Losartan 50 mg. Jackie Hickman is showing past due under [place] [employer] care Ins.  [place] Management Direct Dial [phone]  Fax [phone] [email or web address]

## 2017-10-04 ENCOUNTER — Other Ambulatory Visit: Payer: Self-pay

## 2017-10-04 NOTE — Patient Outreach (Signed)
[facility]) Care Management  10/04/2017  Jackie Hickman 03/14/1933 [id number]   Medication Adherence call to Jackie Hickman patient's telephone number under [facility] is disconnected Epic has a number but no answer patient is due on Losartan 50 mg and atorvastatin  40 mg. Jackie Hickman is showing past due under [place].  [place] Management Direct Dial [phone]  Fax [phone] [email or web address]

## 2017-10-30 IMAGING — DX DG FOOT COMPLETE 3+V*L*
3 series · 3 of 3 positions shown · non-contrast
Comparison: None.

CLINICAL DATA: Left foot pain without trauma.  History of gout.

EXAM:
LEFT FOOT - COMPLETE 3+ VIEW

[foot ap]
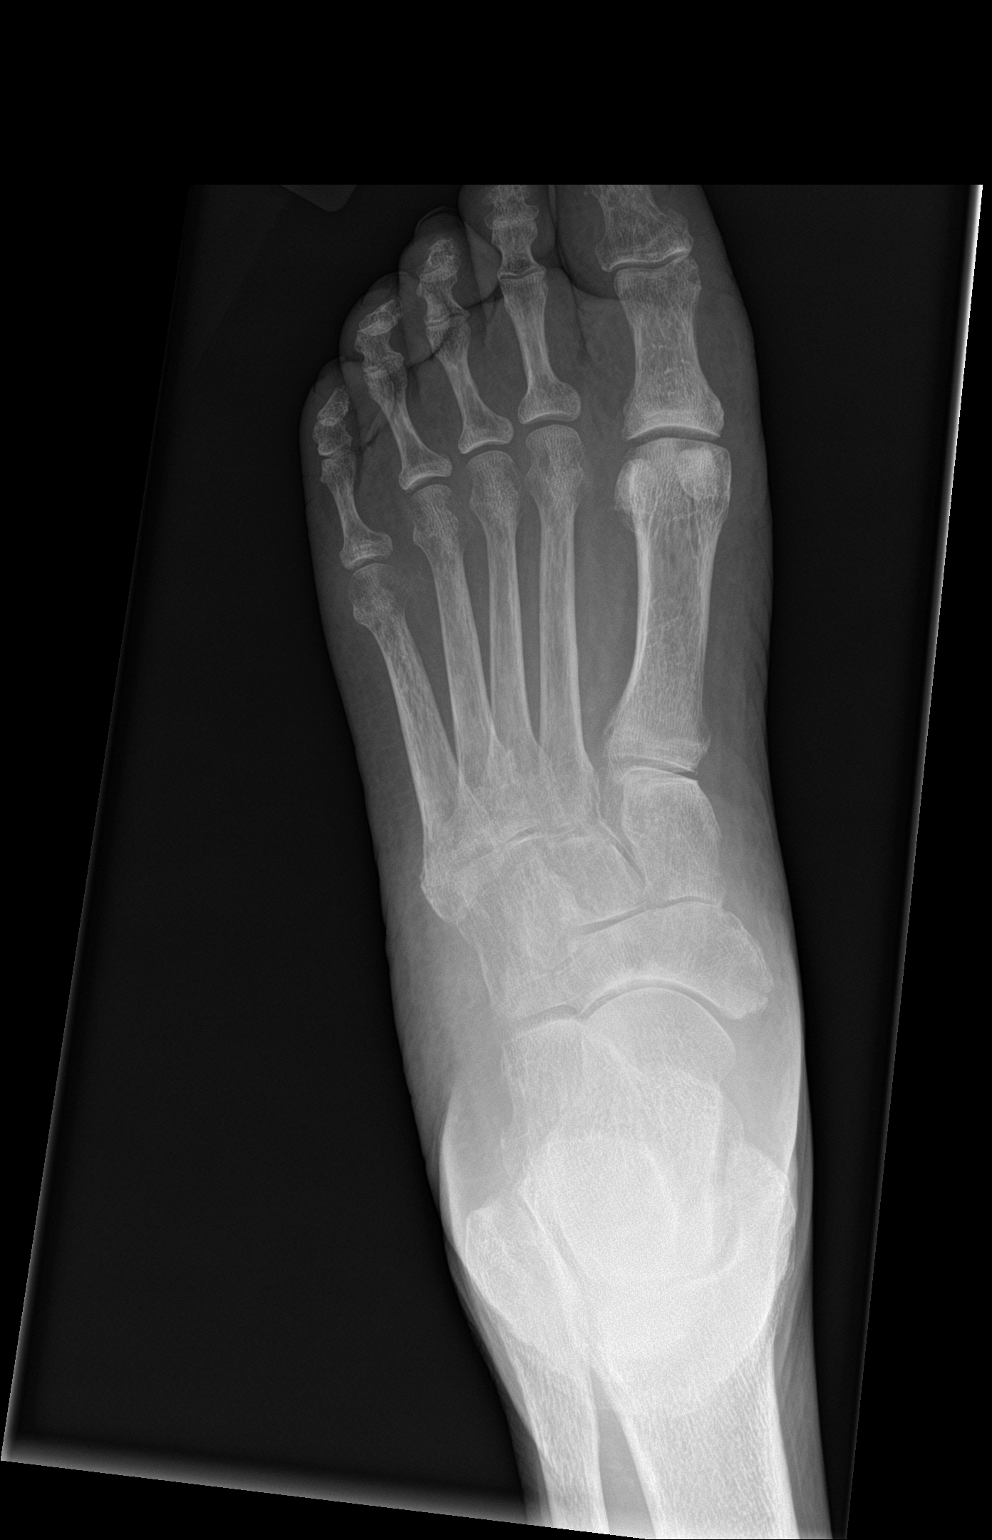

[foot obl]
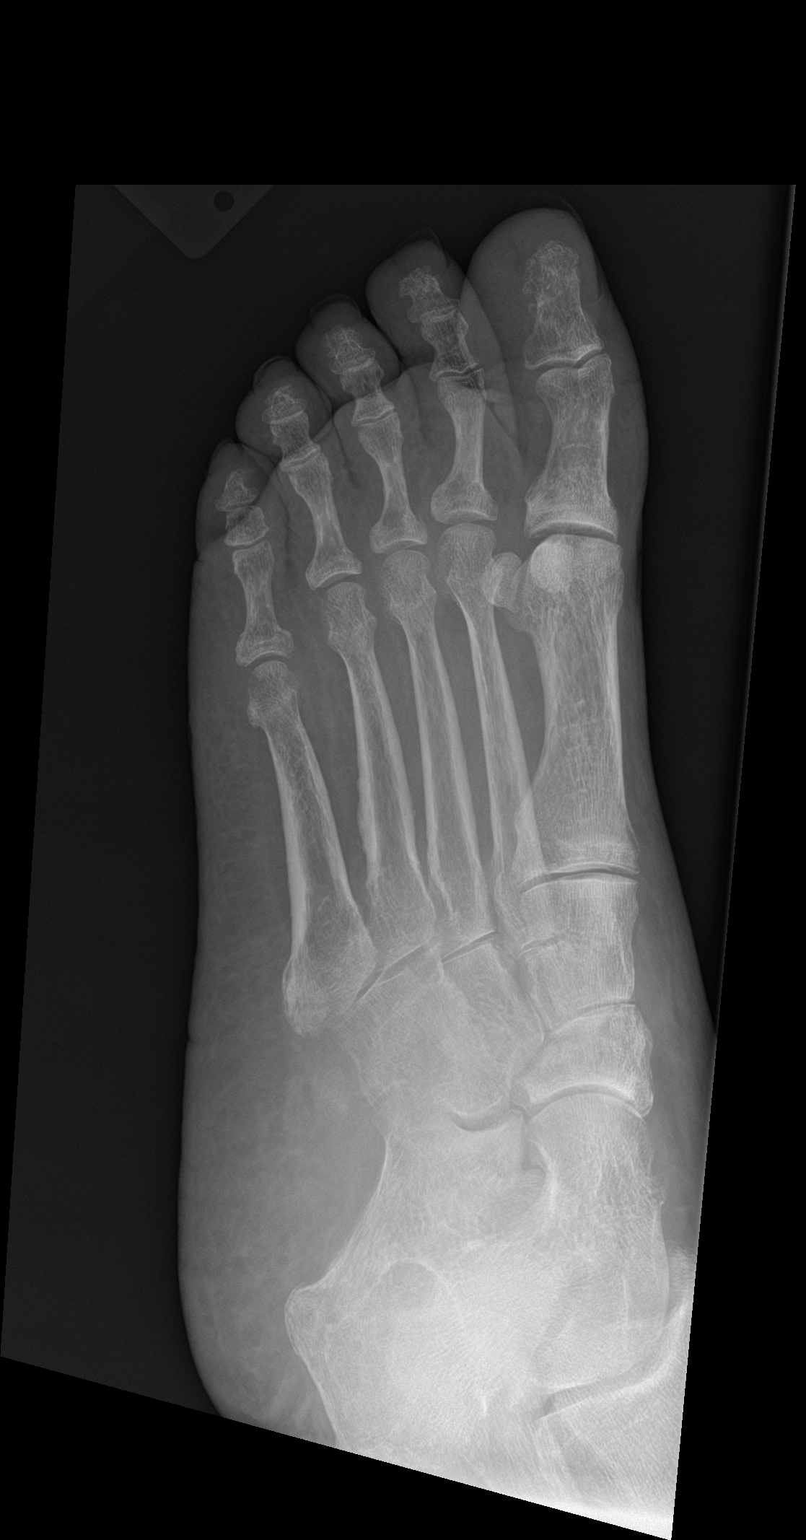

[foot lat]
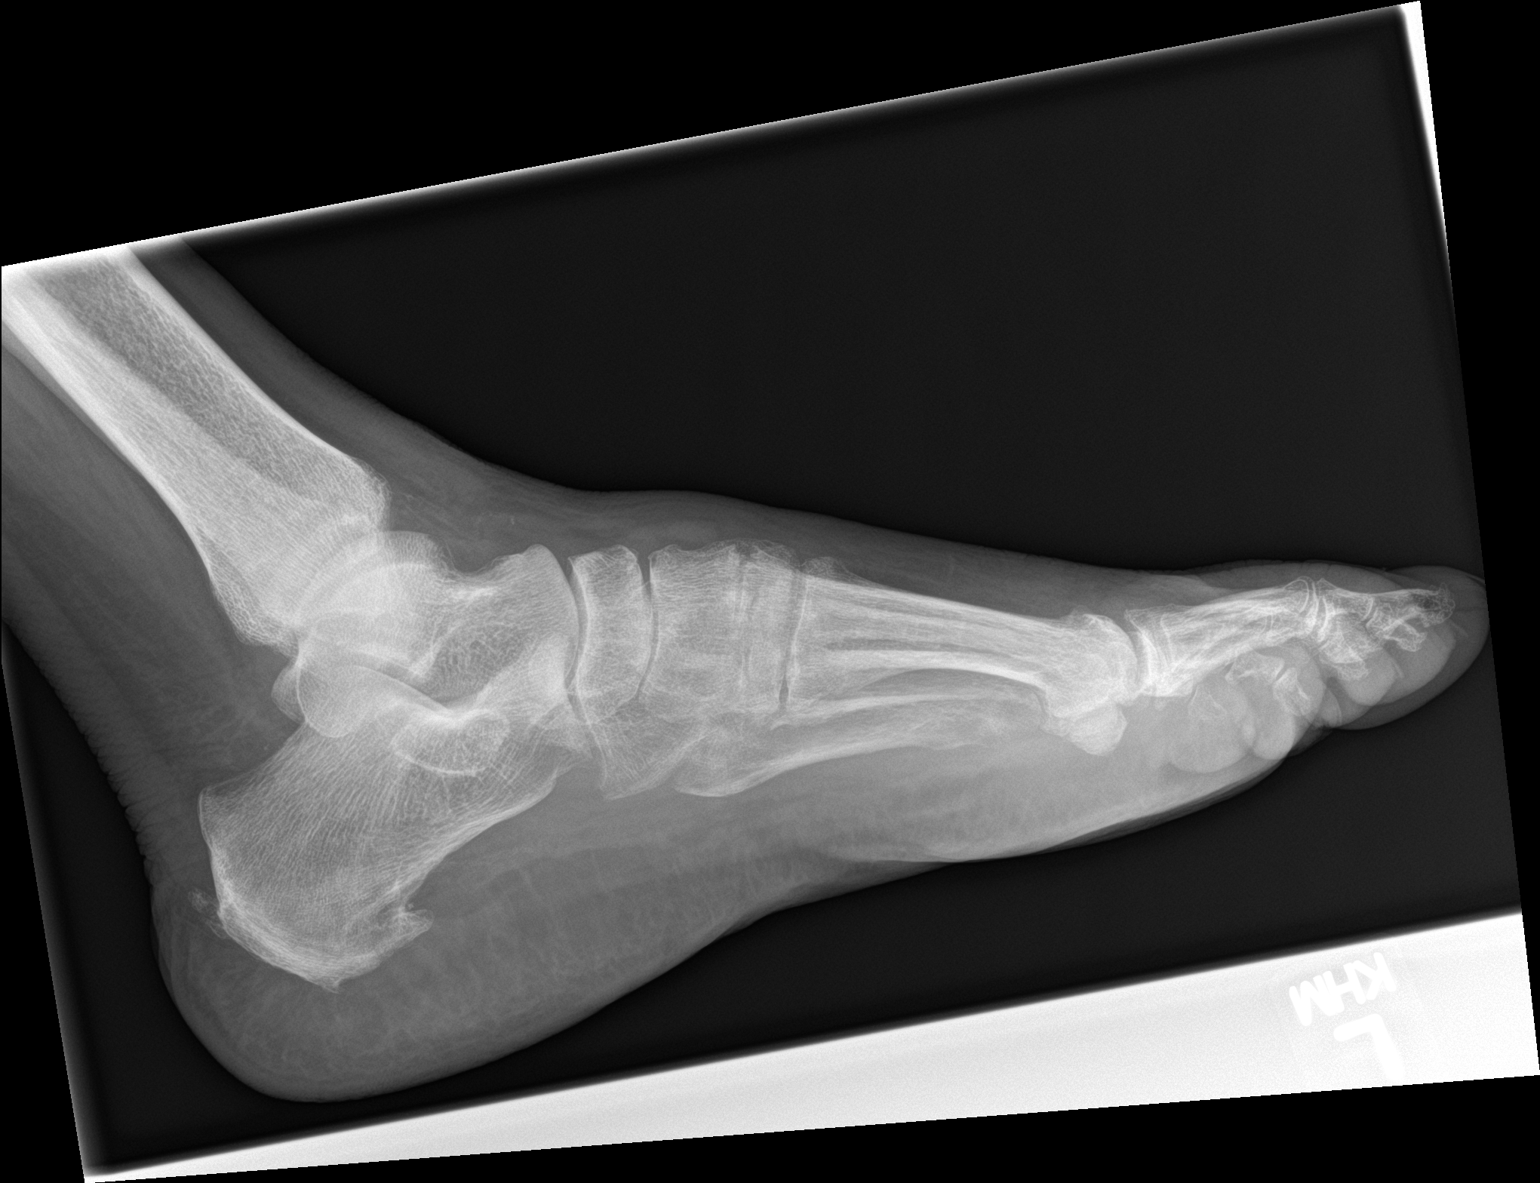

[3 of 3 positions shown; findings below may reference images not displayed]

FINDINGS: Diffuse osteopenia. No fractures. Small plantar spur. Small erosion
at the distal aspect of the first proximal phalanx is probably not
changed given difference in technique.
IMPRESSION: No acute interval change.

## 2017-11-08 IMAGING — DX DG CERVICAL SPINE 1V
2 series · 2 of 2 positions shown · non-contrast
Comparison: No recent prior .

CLINICAL DATA: Neck pain.

EXAM:
CERVICAL SPINE 1 VIEW

[c-spine lat]
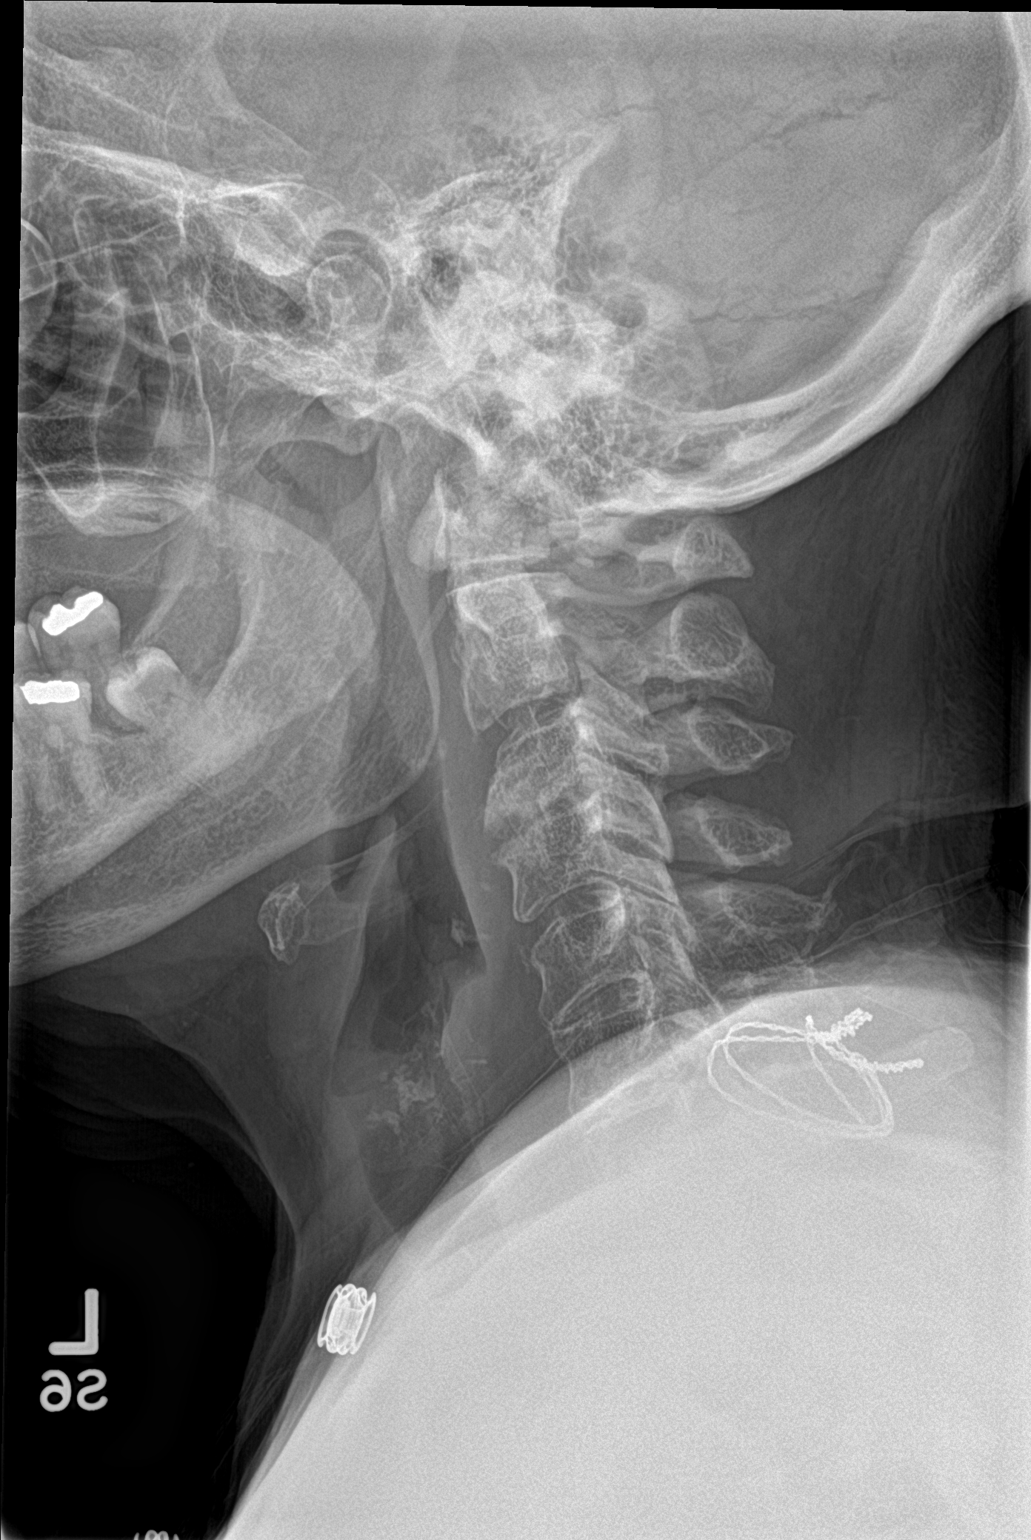

[t-spine swimmers]
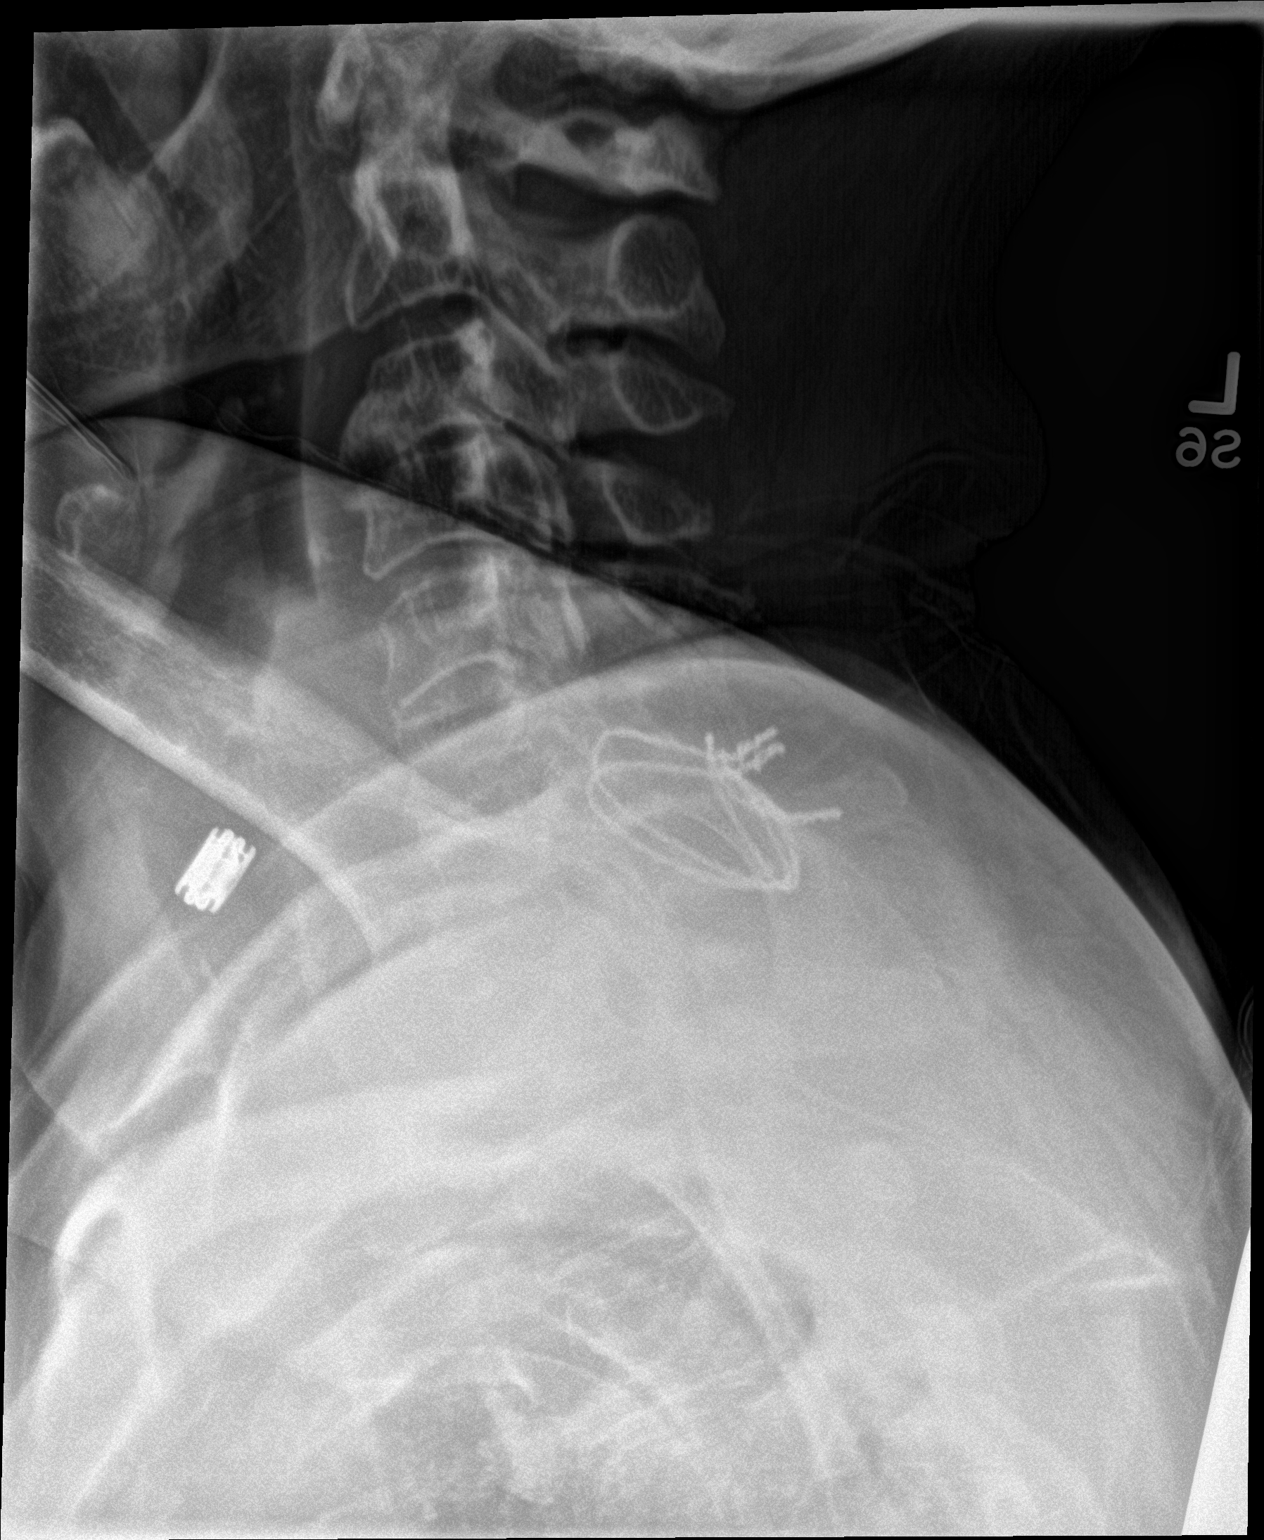

[2 of 2 positions shown; findings below may reference images not displayed]

FINDINGS: Multilevel degenerative change. No acute abnormality identified. No
evidence of fracture dislocation. Posterior cervical wiring C6-C7
noted.
IMPRESSION: Postsurgical change cervical spine. Diffuse degenerative change. No
acute bony abnormality identified. No evidence of fracture or
dislocation.

## 2018-03-13 IMAGING — US US ABDOMEN COMPLETE
1 series · 14 of 25 positions shown · non-contrast
Comparison: None.

CLINICAL DATA: Nonspecific pain and incontinence

EXAM:
ABDOMEN ULTRASOUND COMPLETE

[Series 1: us abdomen complete · 0.21mm/px · 14 of 86 slices shown]
[im 1/86]
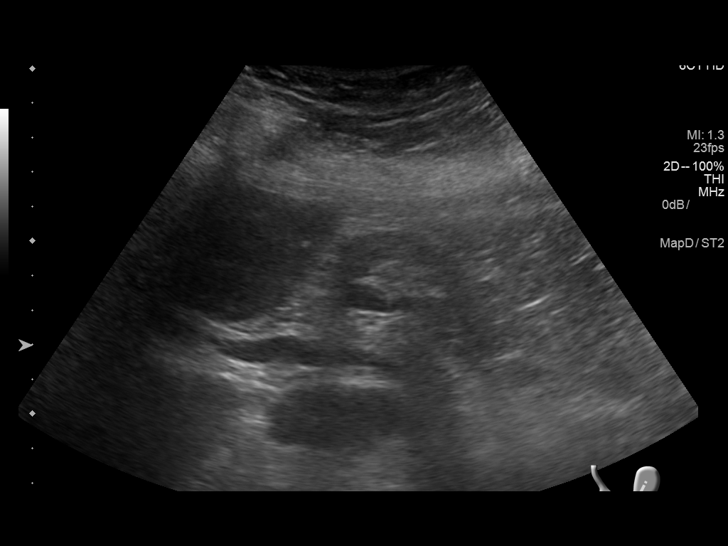
[im 8/86]
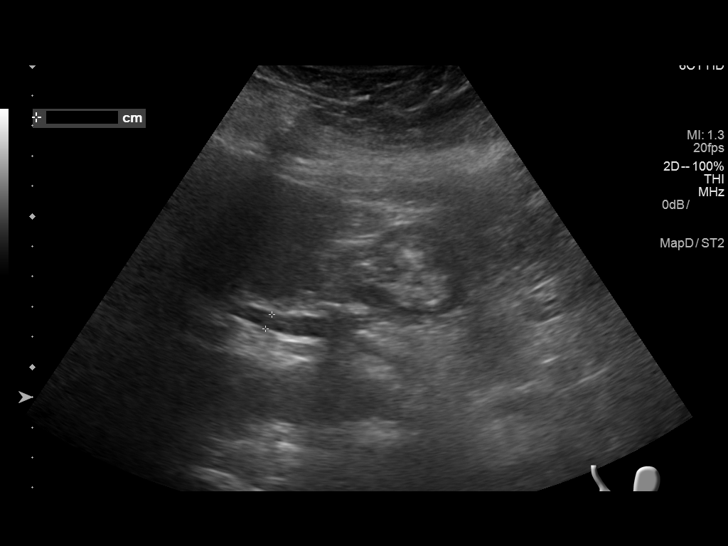
[im 15/86]
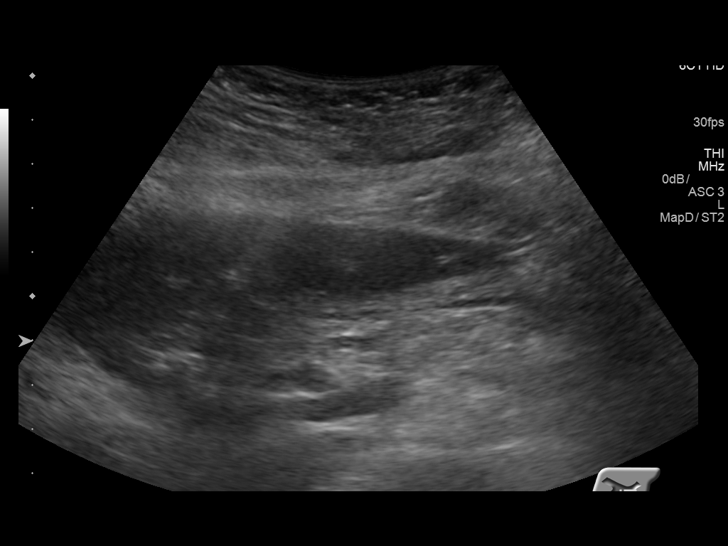
[im 22/86]
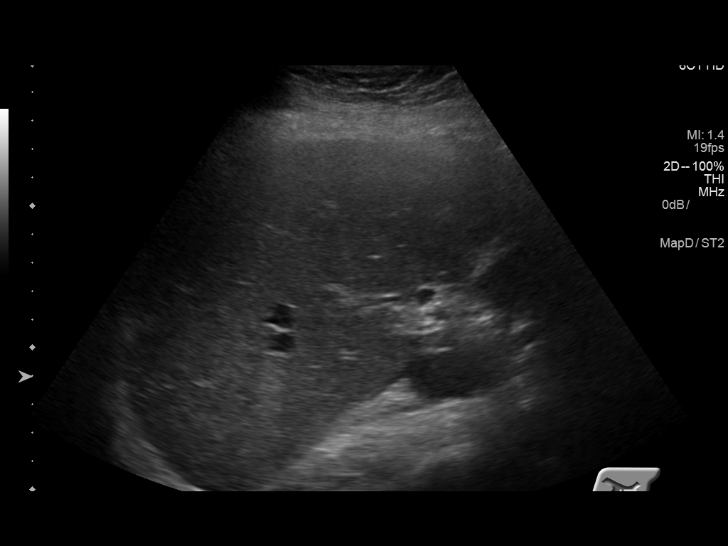
[im 29/86]
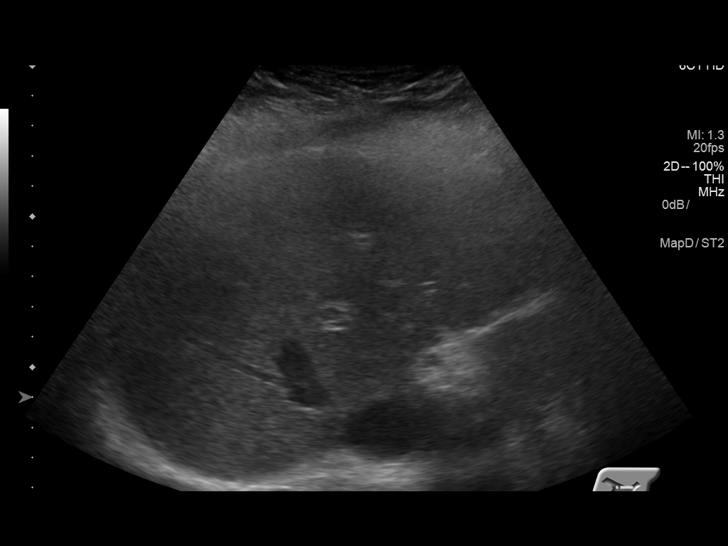
[im 32/86]
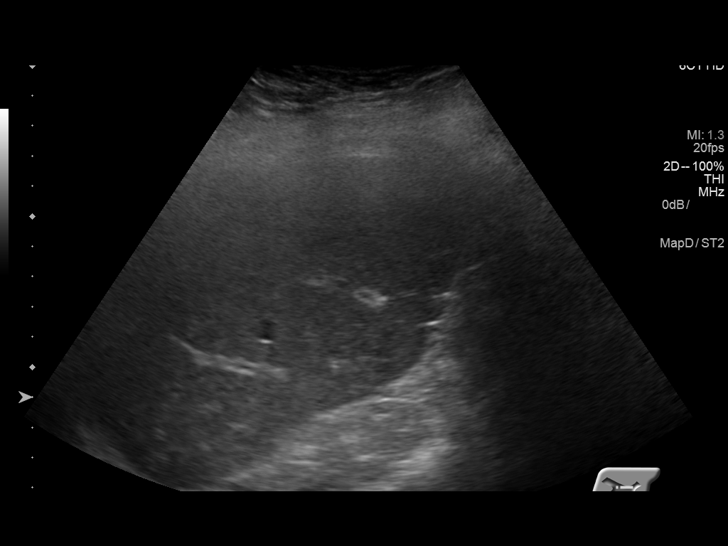
[im 39/86]
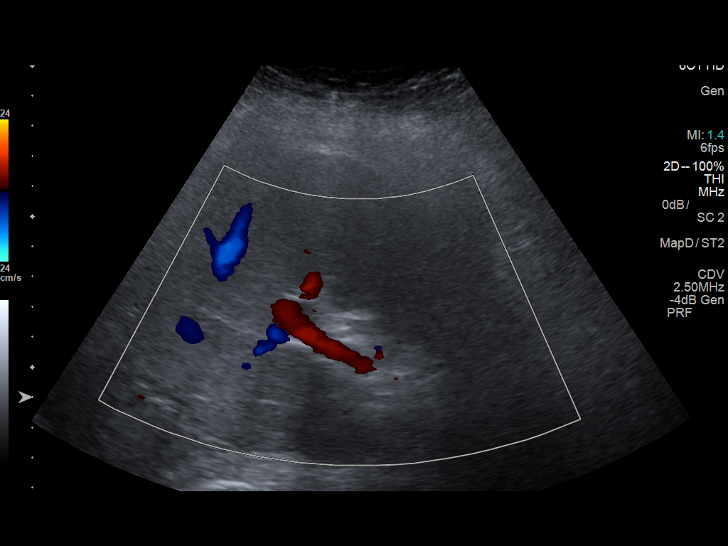
[im 47/86]
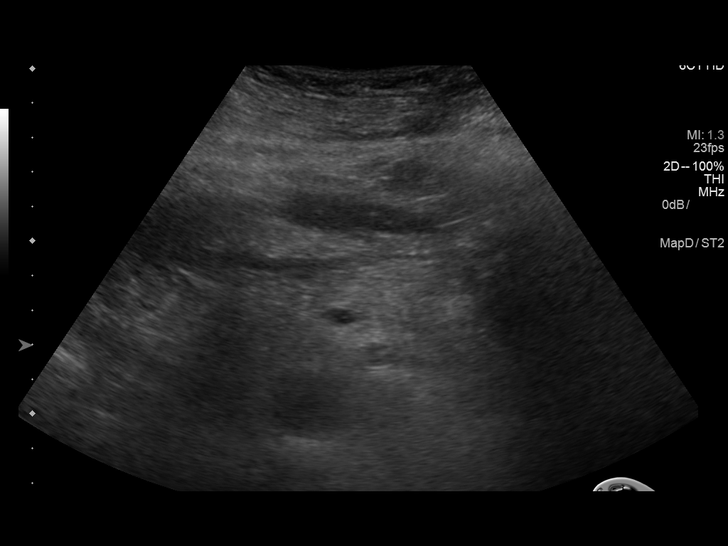
[im 54/86]
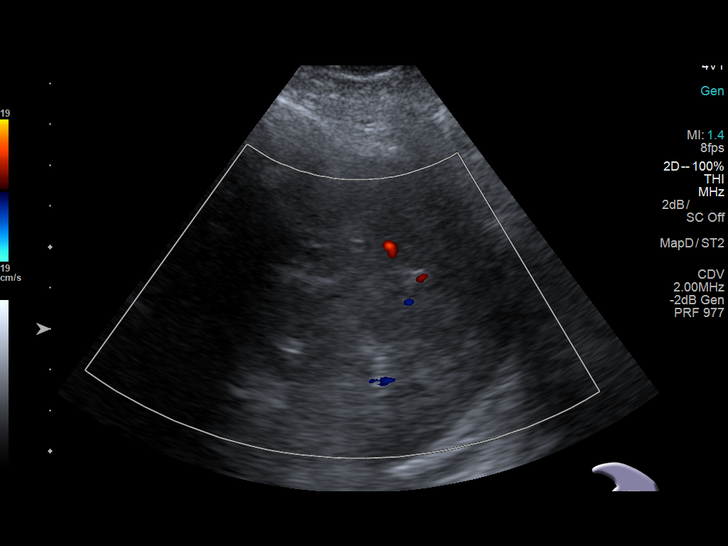
[im 57/86]
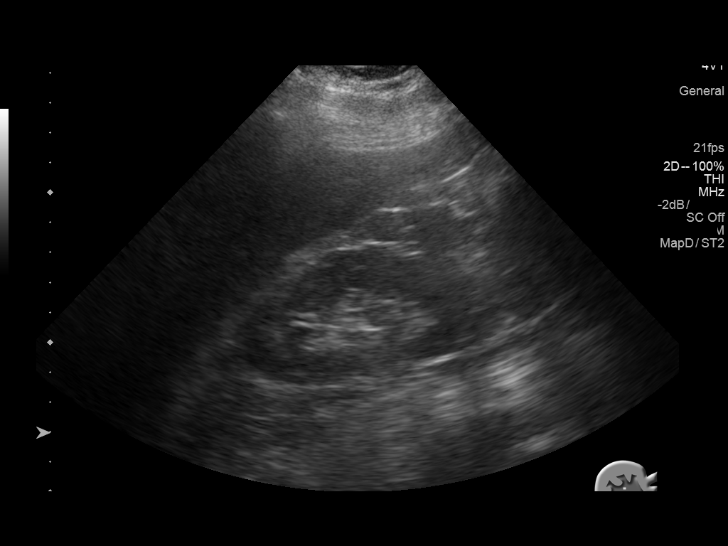
[im 64/86]
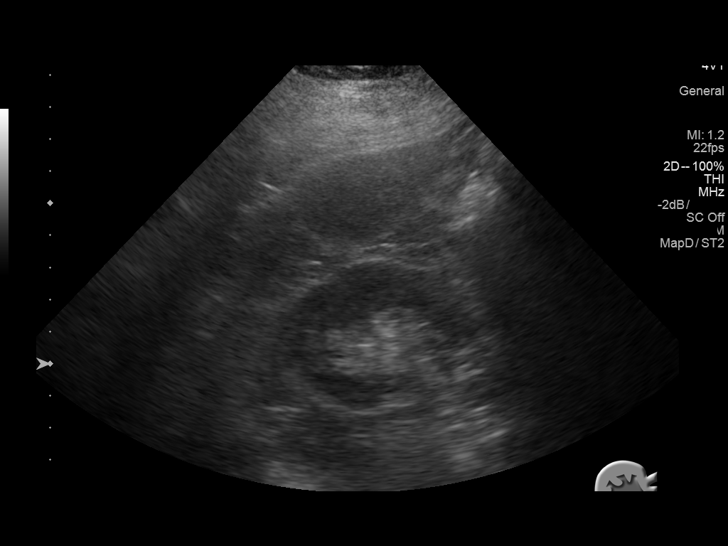
[im 71/86]
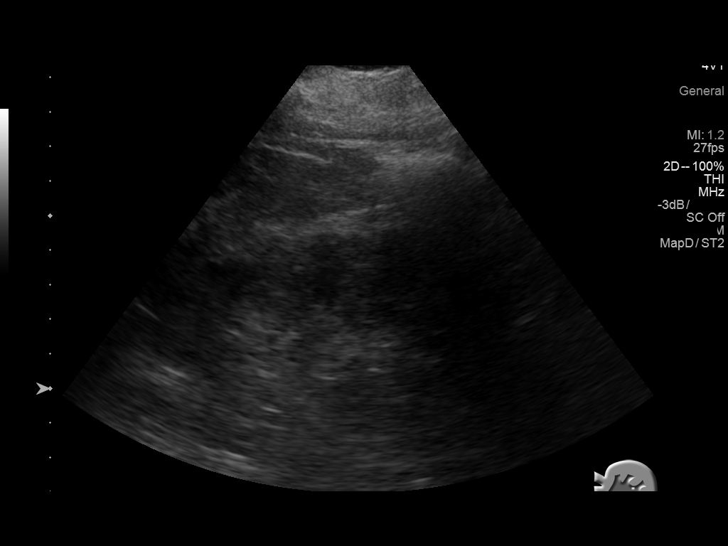
[im 78/86]
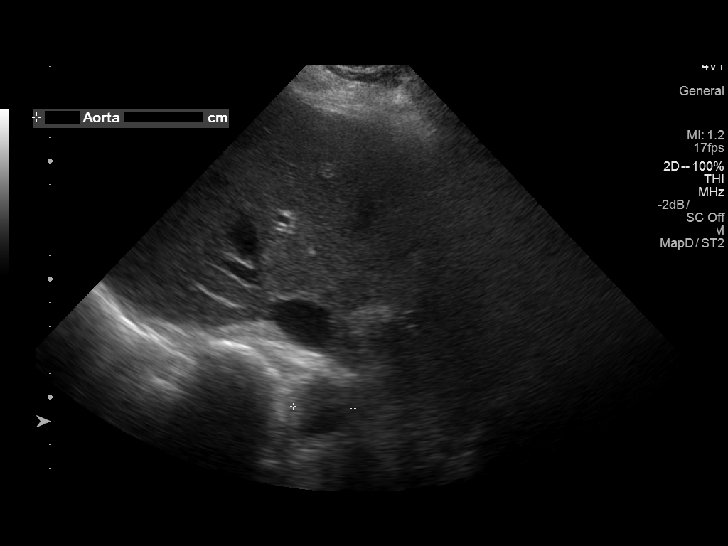
[im 86/86]
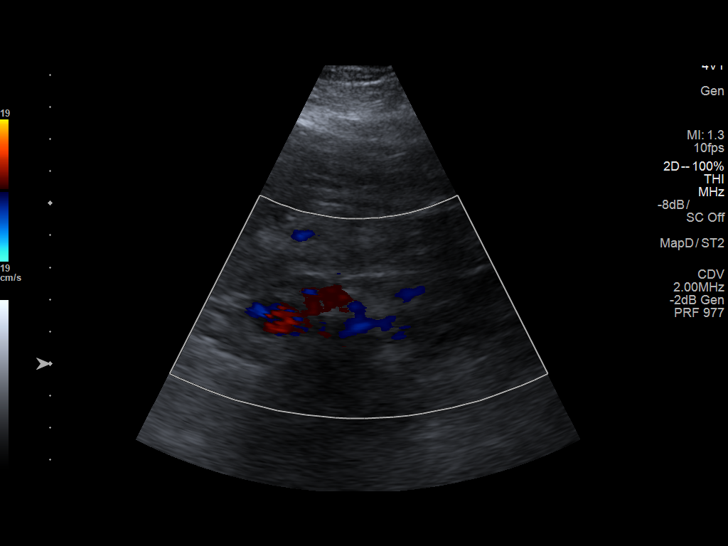

[14 of 25 positions shown; findings below may reference images not displayed]

FINDINGS: Gallbladder: Not visualized and suspected absent.

Common bile duct: Diameter: 5 mm. There is no intrahepatic, common
hepatic, or common bile duct dilatation.

Liver: No focal lesion identified. Within normal limits in
parenchymal echogenicity.

IVC: No abnormality visualized.

Pancreas: No mass or inflammatory focus.

Spleen: Size and appearance within normal limits.

Right Kidney: Length: 9.4 cm. Echogenicity within normal limits. No
mass or hydronephrosis visualized.

Left Kidney: Length: 9.7 cm. Echogenicity within normal limits. No
mass or hydronephrosis visualized.

Abdominal aorta: No aneurysm visualized.

Other findings: No demonstrable ascites.
IMPRESSION: Gallbladder not visualized and presumed absent. Study otherwise
unremarkable.

## 2022-03-18 ENCOUNTER — Inpatient Hospital Stay (HOSPITAL_COMMUNITY)
Admission: EM | Admit: 2022-03-18 | Discharge: 2022-03-22 | DRG: 871 | Disposition: A | Discharge status: Skilled Nursing Facility | Payer: Medicare Other | Attending: Internal Medicine | Admitting: Internal Medicine

## 2022-03-18 ENCOUNTER — Emergency Department (HOSPITAL_COMMUNITY): Payer: Medicare Other

## 2022-03-18 ENCOUNTER — Other Ambulatory Visit: Payer: Self-pay

## 2022-03-18 DIAGNOSIS — Z993 Dependence on wheelchair: Secondary | ICD-10-CM | POA: Diagnosis not present

## 2022-03-18 DIAGNOSIS — Z66 Do not resuscitate: Secondary | ICD-10-CM | POA: Diagnosis present

## 2022-03-18 DIAGNOSIS — Z515 Encounter for palliative care: Secondary | ICD-10-CM | POA: Diagnosis not present

## 2022-03-18 DIAGNOSIS — E869 Volume depletion, unspecified: Secondary | ICD-10-CM | POA: Diagnosis present

## 2022-03-18 DIAGNOSIS — Z820 Family history of epilepsy and other diseases of the nervous system: Secondary | ICD-10-CM

## 2022-03-18 DIAGNOSIS — F0390 Unspecified dementia without behavioral disturbance: Secondary | ICD-10-CM | POA: Diagnosis present

## 2022-03-18 DIAGNOSIS — E1122 Type 2 diabetes mellitus with diabetic chronic kidney disease: Secondary | ICD-10-CM | POA: Diagnosis present

## 2022-03-18 DIAGNOSIS — D696 Thrombocytopenia, unspecified: Secondary | ICD-10-CM | POA: Diagnosis present

## 2022-03-18 DIAGNOSIS — N390 Urinary tract infection, site not specified: Secondary | ICD-10-CM | POA: Diagnosis not present

## 2022-03-18 DIAGNOSIS — M109 Gout, unspecified: Secondary | ICD-10-CM | POA: Diagnosis present

## 2022-03-18 DIAGNOSIS — G9341 Metabolic encephalopathy: Secondary | ICD-10-CM | POA: Diagnosis present

## 2022-03-18 DIAGNOSIS — N179 Acute kidney failure, unspecified: Secondary | ICD-10-CM | POA: Diagnosis not present

## 2022-03-18 DIAGNOSIS — A419 Sepsis, unspecified organism: Principal | ICD-10-CM | POA: Diagnosis present

## 2022-03-18 DIAGNOSIS — T68XXXA Hypothermia, initial encounter: Secondary | ICD-10-CM | POA: Diagnosis not present

## 2022-03-18 DIAGNOSIS — N3000 Acute cystitis without hematuria: Secondary | ICD-10-CM | POA: Diagnosis present

## 2022-03-18 DIAGNOSIS — R9389 Abnormal findings on diagnostic imaging of other specified body structures: Secondary | ICD-10-CM | POA: Diagnosis present

## 2022-03-18 DIAGNOSIS — I10 Essential (primary) hypertension: Secondary | ICD-10-CM | POA: Diagnosis present

## 2022-03-18 DIAGNOSIS — N1832 Chronic kidney disease, stage 3b: Secondary | ICD-10-CM | POA: Diagnosis present

## 2022-03-18 DIAGNOSIS — E039 Hypothyroidism, unspecified: Secondary | ICD-10-CM

## 2022-03-18 DIAGNOSIS — E785 Hyperlipidemia, unspecified: Secondary | ICD-10-CM | POA: Diagnosis present

## 2022-03-18 DIAGNOSIS — R319 Hematuria, unspecified: Secondary | ICD-10-CM | POA: Diagnosis not present

## 2022-03-18 DIAGNOSIS — E1129 Type 2 diabetes mellitus with other diabetic kidney complication: Secondary | ICD-10-CM | POA: Diagnosis present

## 2022-03-18 DIAGNOSIS — J9811 Atelectasis: Secondary | ICD-10-CM | POA: Diagnosis present

## 2022-03-18 DIAGNOSIS — R6520 Severe sepsis without septic shock: Secondary | ICD-10-CM | POA: Diagnosis present

## 2022-03-18 DIAGNOSIS — I129 Hypertensive chronic kidney disease with stage 1 through stage 4 chronic kidney disease, or unspecified chronic kidney disease: Secondary | ICD-10-CM | POA: Diagnosis present

## 2022-03-18 DIAGNOSIS — Z1152 Encounter for screening for COVID-19: Secondary | ICD-10-CM | POA: Diagnosis not present

## 2022-03-18 DIAGNOSIS — H5462 Unqualified visual loss, left eye, normal vision right eye: Secondary | ICD-10-CM | POA: Diagnosis present

## 2022-03-18 DIAGNOSIS — H409 Unspecified glaucoma: Secondary | ICD-10-CM | POA: Diagnosis present

## 2022-03-18 DIAGNOSIS — R4182 Altered mental status, unspecified: Secondary | ICD-10-CM | POA: Diagnosis not present

## 2022-03-18 DIAGNOSIS — T68XXXD Hypothermia, subsequent encounter: Secondary | ICD-10-CM | POA: Diagnosis not present

## 2022-03-18 DIAGNOSIS — Z79899 Other long term (current) drug therapy: Secondary | ICD-10-CM

## 2022-03-18 DIAGNOSIS — N3001 Acute cystitis with hematuria: Secondary | ICD-10-CM | POA: Diagnosis not present

## 2022-03-18 DIAGNOSIS — E035 Myxedema coma: Secondary | ICD-10-CM | POA: Diagnosis present

## 2022-03-18 DIAGNOSIS — E1121 Type 2 diabetes mellitus with diabetic nephropathy: Secondary | ICD-10-CM

## 2022-03-18 LAB — LACTIC ACID, PLASMA
Lactic Acid, Venous: 2.1 mmol/L (ref 0.5–1.9)
Lactic Acid, Venous: 1.9 mmol/L (ref 0.5–1.9)

## 2022-03-18 LAB — CBC WITH DIFFERENTIAL/PLATELET
Abs Immature Granulocytes: 0.01 10*3/uL (ref 0.00–0.07)
Basophils Absolute: 0 10*3/uL (ref 0.0–0.1)
Basophils Relative: 0 %
Eosinophils Absolute: 0 10*3/uL (ref 0.0–0.5)
Eosinophils Relative: 1 %
HCT: 38.9 % (ref 36.0–46.0)
Hemoglobin: 12.4 g/dL (ref 12.0–15.0)
Immature Granulocytes: 0 %
Lymphocytes Relative: 30 %
Lymphs Abs: 1.1 10*3/uL (ref 0.7–4.0)
MCH: 30 pg (ref 26.0–34.0)
MCHC: 31.9 g/dL (ref 30.0–36.0)
MCV: 94 fL (ref 80.0–100.0)
Monocytes Absolute: 0.2 10*3/uL (ref 0.1–1.0)
Monocytes Relative: 6 %
Neutro Abs: 2.2 10*3/uL (ref 1.7–7.7)
Neutrophils Relative %: 63 %
Platelets: 81 10*3/uL — ABNORMAL LOW (ref 150–400)
RBC: 4.14 MIL/uL (ref 3.87–5.11)
RDW: 17.3 % — ABNORMAL HIGH (ref 11.5–15.5)
WBC: 3.5 10*3/uL — ABNORMAL LOW (ref 4.0–10.5)
nRBC: 1.4 % — ABNORMAL HIGH (ref 0.0–0.2)

## 2022-03-18 LAB — COMPREHENSIVE METABOLIC PANEL
ALT: 49 U/L — ABNORMAL HIGH (ref 0–44)
AST: 39 U/L (ref 15–41)
Albumin: 3.2 g/dL — ABNORMAL LOW (ref 3.5–5.0)
Alkaline Phosphatase: 88 U/L (ref 38–126)
Anion gap: 7 (ref 5–15)
BUN: 40 mg/dL — ABNORMAL HIGH (ref 8–23)
CO2: 24 mmol/L (ref 22–32)
Calcium: 8.9 mg/dL (ref 8.9–10.3)
Chloride: 114 mmol/L — ABNORMAL HIGH (ref 98–111)
Creatinine, Ser: 1.26 mg/dL — ABNORMAL HIGH (ref 0.44–1.00)
GFR, Estimated: 41 mL/min — ABNORMAL LOW (ref >60)
Glucose, Bld: 118 mg/dL — ABNORMAL HIGH (ref 70–99)
Potassium: 3.5 mmol/L (ref 3.5–5.1)
Sodium: 145 mmol/L (ref 135–145)
Total Bilirubin: 0.3 mg/dL (ref 0.3–1.2)
Total Protein: 7.2 g/dL (ref 6.5–8.1)

## 2022-03-18 LAB — I-STAT CHEM 8, ED
BUN: 52 mg/dL — ABNORMAL HIGH (ref 8–23)
Calcium, Ion: 1.13 mmol/L — ABNORMAL LOW (ref 1.15–1.40)
Chloride: 115 mmol/L — ABNORMAL HIGH (ref 98–111)
Creatinine, Ser: 1.3 mg/dL — ABNORMAL HIGH (ref 0.44–1.00)
Glucose, Bld: 116 mg/dL — ABNORMAL HIGH (ref 70–99)
HCT: 38 % (ref 36.0–46.0)
Hemoglobin: 12.9 g/dL (ref 12.0–15.0)
Potassium: 4.9 mmol/L (ref 3.5–5.1)
Sodium: 147 mmol/L — ABNORMAL HIGH (ref 135–145)
TCO2: 25 mmol/L (ref 22–32)

## 2022-03-18 LAB — APTT: aPTT: 37 seconds — ABNORMAL HIGH (ref 24–36)

## 2022-03-18 LAB — MAGNESIUM: Magnesium: 1.7 mg/dL (ref 1.7–2.4)

## 2022-03-18 LAB — GLUCOSE, CAPILLARY: Glucose-Capillary: 113 mg/dL — ABNORMAL HIGH (ref 70–99)

## 2022-03-18 LAB — RESP PANEL BY RT-PCR (RSV, FLU A&B, COVID)  RVPGX2
Influenza A by PCR: NEGATIVE
Influenza B by PCR: NEGATIVE
Resp Syncytial Virus by PCR: NEGATIVE
SARS Coronavirus 2 by RT PCR: NEGATIVE

## 2022-03-18 LAB — URINALYSIS, ROUTINE W REFLEX MICROSCOPIC
Bilirubin Urine: NEGATIVE
Glucose, UA: NEGATIVE mg/dL
Ketones, ur: NEGATIVE mg/dL
Nitrite: NEGATIVE
Protein, ur: 100 mg/dL — AB
Specific Gravity, Urine: 1.012 (ref 1.005–1.030)
WBC, UA: >50 WBC/hpf — ABNORMAL HIGH (ref 0–5)
pH: 8 (ref 5.0–8.0)

## 2022-03-18 LAB — PROTIME-INR
INR: 1 (ref 0.8–1.2)
Prothrombin Time: 13.5 seconds (ref 11.4–15.2)

## 2022-03-18 LAB — TSH: TSH: 30.15 u[IU]/mL — ABNORMAL HIGH (ref 0.350–4.500)

## 2022-03-18 MED ORDER — SODIUM CHLORIDE 0.9 % IV SOLN
2.0000 g | Freq: Once | INTRAVENOUS | Status: AC
  Administered 2022-03-18: 2 g via INTRAVENOUS
  Filled 2022-03-18: qty 12.5

## 2022-03-18 MED ORDER — ACETAMINOPHEN 650 MG RE SUPP: 650.0000 mg | Freq: Four times a day (QID) | RECTAL | Status: DC | PRN

## 2022-03-18 MED ORDER — IOHEXOL 300 MG/ML  SOLN
80.0000 mL | Freq: Once | INTRAMUSCULAR | Status: AC | PRN
  Administered 2022-03-18: 80 mL via INTRAVENOUS

## 2022-03-18 MED ORDER — ACETAMINOPHEN 325 MG PO TABS: 650.0000 mg | ORAL_TABLET | Freq: Four times a day (QID) | ORAL | Status: DC | PRN

## 2022-03-18 MED ORDER — HYDROCORTISONE SOD SUC (PF) 100 MG IJ SOLR
100.0000 mg | Freq: Three times a day (TID) | INTRAMUSCULAR | Status: DC
  Administered 2022-03-18 – 2022-03-20 (×6): 100 mg via INTRAVENOUS
  Filled 2022-03-18 (×6): qty 2

## 2022-03-18 MED ORDER — LEVOTHYROXINE SODIUM 100 MCG/5ML IV SOLN
50.0000 ug | Freq: Every day | INTRAVENOUS | Status: DC
  Administered 2022-03-19 – 2022-03-21 (×3): 50 ug via INTRAVENOUS
  Filled 2022-03-18 (×4): qty 5

## 2022-03-18 MED ORDER — VANCOMYCIN HCL 1750 MG/350ML IV SOLN
1750.0000 mg | Freq: Once | INTRAVENOUS | Status: AC
  Administered 2022-03-18: 1750 mg via INTRAVENOUS
  Filled 2022-03-18: qty 350

## 2022-03-18 MED ORDER — SODIUM CHLORIDE 0.9 % IV SOLN
2.0000 g | INTRAVENOUS | Status: DC
  Administered 2022-03-19: 2 g via INTRAVENOUS
  Filled 2022-03-18: qty 12.5

## 2022-03-18 MED ORDER — NICARDIPINE HCL IN NACL 20-0.86 MG/200ML-% IV SOLN: 3.0000 mg/h | INTRAVENOUS | Status: DC

## 2022-03-18 MED ORDER — LACTATED RINGERS IV BOLUS
500.0000 mL | Freq: Once | INTRAVENOUS | Status: AC
  Administered 2022-03-18: 500 mL via INTRAVENOUS

## 2022-03-18 MED ORDER — HYDRALAZINE HCL 20 MG/ML IJ SOLN
10.0000 mg | INTRAMUSCULAR | Status: DC | PRN
  Administered 2022-03-18 – 2022-03-20 (×2): 10 mg via INTRAVENOUS
  Filled 2022-03-18 (×2): qty 1

## 2022-03-18 MED ORDER — LACTATED RINGERS IV BOLUS
1000.0000 mL | Freq: Once | INTRAVENOUS | Status: AC
  Administered 2022-03-18: 1000 mL via INTRAVENOUS

## 2022-03-18 MED ORDER — ONDANSETRON HCL 4 MG PO TABS: 4.0000 mg | ORAL_TABLET | Freq: Four times a day (QID) | ORAL | Status: DC | PRN

## 2022-03-18 MED ORDER — ONDANSETRON HCL 4 MG/2ML IJ SOLN: 4.0000 mg | Freq: Four times a day (QID) | INTRAMUSCULAR | Status: DC | PRN

## 2022-03-18 MED ORDER — CHLORHEXIDINE GLUCONATE CLOTH 2 % EX PADS
6.0000 | MEDICATED_PAD | Freq: Every day | CUTANEOUS | Status: DC
  Administered 2022-03-18 – 2022-03-21 (×4): 6 via TOPICAL

## 2022-03-18 MED ORDER — LACTATED RINGERS IV SOLN: INTRAVENOUS | Status: DC

## 2022-03-18 MED ORDER — INSULIN ASPART 100 UNIT/ML IJ SOLN
0.0000 [IU] | Freq: Four times a day (QID) | INTRAMUSCULAR | Status: DC
  Administered 2022-03-19: 2 [IU] via SUBCUTANEOUS
  Administered 2022-03-19: 1 [IU] via SUBCUTANEOUS
  Administered 2022-03-20: 2 [IU] via SUBCUTANEOUS
  Administered 2022-03-21 (×2): 1 [IU] via SUBCUTANEOUS

## 2022-03-18 MED ORDER — VANCOMYCIN HCL IN DEXTROSE 1-5 GM/200ML-% IV SOLN: 1000.0000 mg | INTRAVENOUS | Status: DC

## 2022-03-18 MED ORDER — LEVOTHYROXINE SODIUM 100 MCG/5ML IV SOLN
300.0000 ug | Freq: Once | INTRAVENOUS | Status: AC
  Administered 2022-03-18: 300 ug via INTRAVENOUS
  Filled 2022-03-18 (×2): qty 15

## 2022-03-18 NOTE — Assessment & Plan Note (Signed)
-   [id number] -Hold Trulicity - SSI -S

## 2022-03-18 NOTE — ED Provider Notes (Signed)
[facility] EMERGENCY DEPARTMENT Provider Note  CSN: [id number] Arrival date & time: 03/18/22 1410  Chief Complaint(s) Altered Mental Status  HPI Jackie Hickman is a 88 y.o. female with PMH T2DM, glaucoma, HTN, HLD who presents emergency department for evaluation of suspected urosepsis.  Patient currently living at a skilled nursing facility and over the last 72 hours has become progressively more confused.  She was diagnosed with a UTI and started on Macrobid over the last 3 days but was found to be hypothermic at her skilled nursing facility and EMS was called to bring the patient to emergency department.  On arrival, patient is very ill-appearing, bradycardic into the 40s with a core temp of 86 degrees.  She is maintaining her blood pressures without difficulty.  She is unable to answer questions due to current altered mental status.   Past Medical History Past Medical History:  Diagnosis Date   Diabetes mellitus    Glaucoma    Gout    Hyperlipidemia    Hypertension    Left foot pain    Patient Active Problem List   Diagnosis Date Noted   Hypomagnesemia 02/24/2016   Normocytic anemia 02/23/2016   Hypokalemia 02/23/2016   Protein-calorie malnutrition ([place]) 02/23/2016   Acute kidney injury superimposed on chronic kidney disease ([place]) 02/23/2016   Alcohol abuse 02/23/2016   Incontinence 02/23/2016   BMI 26.0-26.9,adult 11/07/2014   Essential hypertension 04/24/2013   Type 2 diabetes mellitus with renal manifestations ([place]) 04/24/2013   Hyperlipidemia 04/24/2013   Home Medication(s) Prior to Admission medications   Medication Sig Start Date End Date Taking? Authorizing Provider  brimonidine (ALPHAGAN) 0.2 % ophthalmic solution Apply to eye. 04/30/14  Yes [provider]  cefTRIAXone (ROCEPHIN) 2 g injection Inject 2 g into the muscle daily. 03/18/22  Yes [provider]  docusate sodium (COLACE) 100 MG capsule Take 100 mg by mouth daily.   Yes [provider]  fosfomycin (MONUROL) 3 g PACK Take by mouth.   Yes [provider]  latanoprost (XALATAN) 0.005 % ophthalmic solution Place 1 drop into both eyes at bedtime.   Yes [provider]  nebivolol (BYSTOLIC) 10 MG tablet Take 2 tablets (20 mg total) by mouth daily. Patient taking differently: Take 10 mg by mouth daily. 05/12/15  Yes Hassell Done, [name], FNP  pilocarpine (PILOCAR) 4 % ophthalmic solution Apply to eye. 04/30/14  Yes [provider]  prednisoLONE acetate (PRED FORTE) 1 % ophthalmic suspension Apply to eye. 04/30/14  Yes [provider]  ROCKLATAN 0.02-0.005 % SOLN Apply to eye. 02/23/22  Yes [provider]  TRULICITY 3.15 VV/6.1YW SOPN Inject into the skin. 02/16/22  Yes [provider]  allopurinol (ZYLOPRIM) 100 MG tablet Take 100 mg by mouth daily. 01/26/22   [provider]  atorvastatin (LIPITOR) 40 MG tablet Take 1 tablet (40 mg total) by mouth daily. 08/25/15   Hassell Done, [name], FNP  brimonidine-timolol (COMBIGAN) 0.2-0.5 % ophthalmic solution Place 1 drop into both eyes every 12 (twelve) hours.    [provider]  fenofibrate (TRICOR) 145 MG tablet Take 1 tablet (145 mg total) by mouth daily. 05/12/15   [name], FNP  HYDROcodone-acetaminophen (NORCO/VICODIN) 5-325 MG tablet 1 tab PO q12 hours prn pain Patient not taking: Reported on 03/18/2022 02/26/16   [name], MD  nitrofurantoin, macrocrystal-monohydrate, (MACROBID) 100 MG capsule Take 100 mg by mouth 2 (two) times daily.    [provider]  pantoprazole (PROTONIX) 40 MG tablet Take 1 tablet (  40 mg total) by mouth daily. 02/27/16   Isaac Bliss, Rayford Halsted, MD                                                                                                                                    Past Surgical History Past Surgical History:  Procedure Laterality Date   APPENDECTOMY  1948   BACK SURGERY     fusion  of L5-6   COLONOSCOPY  08/2011   Dr. Silvio Pate: 6 mm sessile polyp in cecum (tubulovillous adenoma with HGD), 25 mm sessile polyp in ascending colon (Tubular adenoma). Piecemeal polypectomy    COLONOSCOPY  08/2012   Dr. Fuller Plan: multiple sessile polyps (6-12 mm), tubular adenoma and tubulovillous adenoma, piecemeal polypectomy    COLONOSCOPY  02/2013   Dr. Fuller Plan: post-polypectomy scar in ascending colon, otherwise normal    COLONOSCOPY N/A 02/25/2016   Procedure: COLONOSCOPY;  Surgeon: [name], MD;  Location: [facility];  Service: Endoscopy;  Laterality: N/A;  patient to receive phenergan 12.'5mg'$  IV on call to endo   ESOPHAGOGASTRODUODENOSCOPY  2005   Dr. Fuller Plan: gastritis    ESOPHAGOGASTRODUODENOSCOPY N/A 02/25/2016   Procedure: ESOPHAGOGASTRODUODENOSCOPY (EGD);  Surgeon: [name], MD;  Location: [facility];  Service: Endoscopy;  Laterality: N/A;  patient to receive phenergan 12.'5mg'$  IV on call to endo   EYE SURGERY     bleeding and glaucoma   LAPAROSCOPIC CHOLECYSTECTOMY  1980   Family History Family History  Problem Relation Age of Onset   Alzheimer's disease Mother 88   Colon cancer Neg Hx    Stomach cancer Neg Hx     Social History Social History   Tobacco Use   Smoking status: Never   Smokeless tobacco: Never  Substance Use Topics   Alcohol use: Yes    Comment: socially. drinks gin and smirnoff vodka    Drug use: No   Allergies Patient has no known allergies.  Review of Systems Review of Systems  Unable to perform ROS: Mental status change    Physical Exam Vital Signs  I have reviewed the triage vital signs BP (!) 186/106   Pulse 62   Temp (!) 91.6 F (33.1 C)   Resp 13   Ht '5\' 2"'$  (1.575 m)   Wt 91.2 kg   SpO2 100%   BMI 36.77 kg/m   Physical Exam Vitals and nursing note reviewed.  Constitutional:      General: She is not in acute distress.    Appearance: She is well-developed. She is ill-appearing and toxic-appearing.  HENT:     Head:  Normocephalic and atraumatic.  Eyes:     Conjunctiva/sclera: Conjunctivae normal.  Cardiovascular:     Rate and Rhythm: Regular rhythm. Bradycardia present.     Heart sounds: No murmur heard. Pulmonary:     Effort: Pulmonary effort is normal. No respiratory distress.     Breath sounds: Normal breath sounds.  Abdominal:     Palpations: Abdomen is soft.     Tenderness: There is no abdominal tenderness.  Musculoskeletal:        General: No swelling.     Cervical back: Neck supple.  Skin:    General: Skin is warm and dry.     Capillary Refill: Capillary refill takes less than 2 seconds.  Neurological:     Mental Status: She is alert. She is disoriented.  Psychiatric:        Mood and Affect: Mood normal.     ED Results and Treatments Labs (all labs ordered are listed, but only abnormal results are displayed) Labs Reviewed  LACTIC ACID, PLASMA - Abnormal; Notable for the following components:      Result Value   Lactic Acid, Venous 2.1 (*)    All other components within normal limits  COMPREHENSIVE METABOLIC PANEL - Abnormal; Notable for the following components:   Chloride 114 (*)    Glucose, Bld 118 (*)    BUN 40 (*)    Creatinine, Ser 1.26 (*)    Albumin 3.2 (*)    ALT 49 (*)    GFR, Estimated 41 (*)    All other components within normal limits  CBC WITH DIFFERENTIAL/PLATELET - Abnormal; Notable for the following components:   WBC 3.5 (*)    RDW 17.3 (*)    Platelets 81 (*)    nRBC 1.4 (*)    All other components within normal limits  URINALYSIS, ROUTINE W REFLEX MICROSCOPIC - Abnormal; Notable for the following components:   APPearance CLOUDY (*)    Hgb urine dipstick SMALL (*)    Protein, ur 100 (*)    Leukocytes,Ua MODERATE (*)    WBC, UA >50 (*)    Bacteria, UA FEW (*)    All other components within normal limits  TSH - Abnormal; Notable for the following components:   TSH 30.150 (*)    All other components within normal limits  I-STAT CHEM 8, ED -  Abnormal; Notable for the following components:   Sodium 147 (*)    Chloride 115 (*)    BUN 52 (*)    Creatinine, Ser 1.30 (*)    Glucose, Bld 116 (*)    Calcium, Ion 1.13 (*)    All other components within normal limits  CULTURE, BLOOD (ROUTINE X 2)  CULTURE, BLOOD (ROUTINE X 2)  URINE CULTURE  RESP PANEL BY RT-PCR (RSV, FLU A&B, COVID)  RVPGX2  LACTIC ACID, PLASMA  PROTIME-INR  APTT                                                                                                                          Radiology CT Head Wo Contrast  Result Date: 03/18/2022 CLINICAL DATA:  Delirium EXAM: CT HEAD WITHOUT CONTRAST TECHNIQUE: Contiguous axial images were obtained from the base of the skull through the vertex without intravenous contrast. RADIATION DOSE REDUCTION: This exam was performed according to the  departmental dose-optimization program which includes automated exposure control, adjustment of the mA and/or kV according to patient size and/or use of iterative reconstruction technique. COMPARISON:  CT head 05/23/2010 report without imaging BRAIN: BRAIN Cerebral ventricle sizes are concordant with the degree of cerebral volume loss. Patchy and confluent areas of decreased attenuation are noted throughout the deep and periventricular white matter of the cerebral hemispheres bilaterally, compatible with chronic microvascular ischemic disease. No evidence of large-territorial acute infarction. No parenchymal hemorrhage. No mass lesion. No extra-axial collection. No mass effect or midline shift. No hydrocephalus. Basilar cisterns are patent. Vascular: No hyperdense vessel. Atherosclerotic calcifications are present within the cavernous internal carotid arteries. Skull: No acute fracture or focal lesion. Sinuses/Orbits: Left maxillary sinus mucosal thickening. Otherwise paranasal sinuses and mastoid air cells are clear. Bilateral lens replacement. Left scleral buckle. Otherwise the orbits are  unremarkable. Other: None. IMPRESSION: No acute intracranial abnormality. Electronically Signed   By: Iven Finn M.D.   On: 03/18/2022 18:07   CT CHEST ABDOMEN PELVIS W CONTRAST  Result Date: 03/18/2022 CLINICAL DATA:  Sepsis EXAM: CT CHEST, ABDOMEN, AND PELVIS WITH CONTRAST TECHNIQUE: Multidetector CT imaging of the chest, abdomen and pelvis was performed following the standard protocol during bolus administration of intravenous contrast. RADIATION DOSE REDUCTION: This exam was performed according to the departmental dose-optimization program which includes automated exposure control, adjustment of the mA and/or kV according to patient size and/or use of iterative reconstruction technique. CONTRAST:  46m OMNIPAQUE IOHEXOL 300 MG/ML  SOLN COMPARISON:  None Available. FINDINGS: CT CHEST FINDINGS Cardiovascular: Normal heart size. No significant pericardial effusion. The thoracic aorta is normal in caliber. Severe atherosclerotic plaque of the thoracic aorta. At least 2 vessel coronary artery calcifications. The main pulmonary artery is normal in caliber. No central pulmonary embolus. Mediastinum/Nodes: No enlarged mediastinal, hilar, or axillary lymph nodes. Heterogeneous partially calcified thyroid glands. Trachea and esophagus demonstrate no significant findings. Lungs/Pleura: Expiratory phase of respiration. Bilateral lower lobe atelectasis. No focal consolidation. Subpleural right upper lobe micronodule (4:62, 5:85). No pulmonary mass. No pleural effusion. No pneumothorax. Musculoskeletal: No chest wall abnormality. No suspicious lytic or blastic osseous lesions. No acute displaced fracture. CT ABDOMEN PELVIS FINDINGS Hepatobiliary: No focal liver abnormality. Status post cholecystectomy. No intrahepatic biliary dilatation. Mild extrahepatic biliary ductal dilatation which can be seen in the post cholecystectomy setting. Pancreas: Diffusely atrophic. No focal lesion. Otherwise normal pancreatic  contour. No surrounding inflammatory changes. No main pancreatic ductal dilatation. Spleen: Normal in size without focal abnormality. Adrenals/Urinary Tract: No adrenal nodule bilaterally. Bilateral kidneys enhance symmetrically. No hydronephrosis. No hydroureter. The urinary bladder is unremarkable. Foley catheter tip and balloon terminate within the urinary bladder lumen. On delayed imaging, there is no urothelial wall thickening and there are no filling defects in the opacified portions of the bilateral collecting systems or ureters. Stomach/Bowel: Stomach is within normal limits. No evidence of bowel wall thickening or dilatation. Appendix appears normal. Vascular/Lymphatic: No abdominal aorta or iliac aneurysm. Severe atherosclerotic plaque of the aorta and its branches. No abdominal, pelvic, or inguinal lymphadenopathy. Reproductive: Pericentimeter intramural calcification likely representative of a degenerative uterine fibroid. Heterogeneous in thickened (12 mm). Endometrium. Otherwise uterus and bilateral adnexa are unremarkable. Other: No intraperitoneal free fluid. No intraperitoneal free gas. No organized fluid collection. Musculoskeletal: No abdominal wall hernia or abnormality. Trace subcutaneus soft tissue emphysema along the anterior lower abdominal wall suggestive of medication injections (2:80). No suspicious lytic or blastic osseous lesions. No acute displaced fracture. Grade 1 anterolisthesis of L4 on  L5. Intervertebral disc space vacuum phenomenon at the L4-L5 and L5-S1 levels. Posterior disc osteophyte complex formation at the L5 on S1 level. IMPRESSION: 1. Heterogeneous and thickened endometrium. Recommend pelvic ultrasound for further evaluation. 2. Heterogeneous partially calcified thyroid glands. In the setting of significant comorbidities or limited life expectancy, no follow-up recommended (ref: J Am Coll Radiol. 2015 Feb;12(2): 143-50). 3. Subpleural right upper lobe micronodule. No  follow-up needed if patient is low-risk.This recommendation follows the consensus statement: Guidelines for Management of Incidental Pulmonary Nodules Detected on CT Images: From the Fleischner Society 2017; Radiology 2017; 284:228-243. 4. Trace subcutaneus soft tissue emphysema along the anterior lower abdominal wall suggestive of medication injections. Electronically Signed   By: Iven Finn M.D.   On: 03/18/2022 18:03   DG Chest Port 1 View  Result Date: 03/18/2022 CLINICAL DATA:  Questionable sepsis in an 88 year old female. EXAM: PORTABLE CHEST 1 VIEW COMPARISON:  None available. FINDINGS: EKG leads project over the chest. Image rotated slightly to the RIGHT. Cardiomediastinal contours and hilar structures unremarkable to the extent evaluated on this nonstandard AP projection accounting for rotation. No lobar level consolidation. Scattered airspace opacities throughout the chest mainly with linear appearance. No pneumothorax. On limited assessment there is no acute skeletal process. IMPRESSION: Scattered airspace opacities throughout the chest mainly with linear appearance. Findings favor scattered atelectasis. Difficult to exclude infection at the lung bases. Electronically Signed   By: Zetta Bills M.D.   On: 03/18/2022 15:22    Pertinent labs & imaging results that were available during my care of the patient were reviewed by me and considered in my medical decision making (see MDM for details).  Medications Ordered in ED Medications  vancomycin (VANCOCIN) IVPB 1000 mg/200 mL premix (has no administration in time range)  lactated ringers infusion ( Intravenous New Bag/Given 03/18/22 1616)  ceFEPIme (MAXIPIME) 2 g in sodium chloride 0.9 % 100 mL IVPB (0 g Intravenous Stopped 03/18/22 1553)  lactated ringers bolus 1,000 mL (0 mLs Intravenous Stopped 03/18/22 1604)  vancomycin (VANCOREADY) IVPB 1750 mg/350 mL (0 mg Intravenous Stopped 03/18/22 1751)  iohexol (OMNIPAQUE) 300 MG/ML solution  80 mL (80 mLs Intravenous Contrast Given 03/18/22 1728)                                                                                                                                     Procedures .Critical Care  Performed by: Teressa Lower, MD Authorized by: Teressa Lower, MD   Critical care provider statement:    Critical care time (minutes):  30   Critical care was necessary to treat or prevent imminent or life-threatening deterioration of the following conditions:  Sepsis   Critical care was time spent personally by me on the following activities:  Development of treatment plan with patient or surrogate, discussions with consultants, evaluation of patient's response to treatment, examination of patient, ordering and review of laboratory studies, ordering and review of  radiographic studies, ordering and performing treatments and interventions, pulse oximetry, re-evaluation of patient's condition and review of old charts   (including critical care time)  Medical Decision Making / ED Course   This patient presents to the ED for concern of hypothermia, this involves an extensive number of treatment options, and is a complaint that carries with it a high risk of complications and morbidity.  The differential diagnosis includes sepsis, myxedema coma, UTI, electrolyte abnormality, intracranial pathology  MDM: Patient seen emerged part for evaluation of altered mental status, UTI and hypothermia.  Physical exam reveals a ill-appearing patient that is bradycardic and cool to the touch, disoriented.  Temp sensing Foley placed and laboratory evaluation showing leukopenia to 3.5, platelet count 81, BUN 40, creatinine 1.26, lactic acid 2.1, urinalysis with moderate leuk esterase, 11-20 red blood cells, greater than 50 white blood cells and few bacteria.  TSH elevated to 30.15 and myxedema remains on the differential.  Given leukopenia, hypothermia, patient meets SIRS criteria and sepsis evaluation  and treatment initiated with broad-spectrum antibiotics and fluid resuscitation.  Chest x-ray more consistent with atelectasis.  At time of signout, patient pending CT imaging.  Please see provider assignment for continuation of workup.  Anticipate admission.   Additional history obtained: -Additional history obtained from multiple family members -External records from outside source obtained and reviewed including: Chart review including previous notes, labs, imaging, consultation notes   Lab Tests: -I ordered, reviewed, and interpreted labs.   The pertinent results include:   Labs Reviewed  LACTIC ACID, PLASMA - Abnormal; Notable for the following components:      Result Value   Lactic Acid, Venous 2.1 (*)    All other components within normal limits  COMPREHENSIVE METABOLIC PANEL - Abnormal; Notable for the following components:   Chloride 114 (*)    Glucose, Bld 118 (*)    BUN 40 (*)    Creatinine, Ser 1.26 (*)    Albumin 3.2 (*)    ALT 49 (*)    GFR, Estimated 41 (*)    All other components within normal limits  CBC WITH DIFFERENTIAL/PLATELET - Abnormal; Notable for the following components:   WBC 3.5 (*)    RDW 17.3 (*)    Platelets 81 (*)    nRBC 1.4 (*)    All other components within normal limits  URINALYSIS, ROUTINE W REFLEX MICROSCOPIC - Abnormal; Notable for the following components:   APPearance CLOUDY (*)    Hgb urine dipstick SMALL (*)    Protein, ur 100 (*)    Leukocytes,Ua MODERATE (*)    WBC, UA >50 (*)    Bacteria, UA FEW (*)    All other components within normal limits  TSH - Abnormal; Notable for the following components:   TSH 30.150 (*)    All other components within normal limits  I-STAT CHEM 8, ED - Abnormal; Notable for the following components:   Sodium 147 (*)    Chloride 115 (*)    BUN 52 (*)    Creatinine, Ser 1.30 (*)    Glucose, Bld 116 (*)    Calcium, Ion 1.13 (*)    All other components within normal limits  CULTURE, BLOOD (ROUTINE X  2)  CULTURE, BLOOD (ROUTINE X 2)  URINE CULTURE  RESP PANEL BY RT-PCR (RSV, FLU A&B, COVID)  RVPGX2  LACTIC ACID, PLASMA  PROTIME-INR  APTT      EKG   EKG Interpretation  Date/Time:  Thursday March 18 2022 14:25:28 EST  Ventricular Rate:  43 PR Interval:  347 QRS Duration: 134 QT Interval:  567 QTC Calculation: 480 R Axis:   -41 Text Interpretation: Sinus bradycardia Prolonged PR interval Left bundle branch block Confirmed by [place] ([id number]) on 03/18/2022 6:21:05 PM         Imaging Studies ordered: I ordered imaging studies including chest x-ray I independently visualized and interpreted imaging. I agree with the radiologist interpretation  CT head, chest abdomen pelvis pending   Medicines ordered and prescription drug management: Meds ordered this encounter  Medications   ceFEPIme (MAXIPIME) 2 g in sodium chloride 0.9 % 100 mL IVPB   lactated ringers bolus 1,000 mL   vancomycin (VANCOREADY) IVPB 1750 mg/350 mL    Order Specific Question:   Indication:    Answer:   Sepsis   vancomycin (VANCOCIN) IVPB 1000 mg/200 mL premix    Order Specific Question:   Indication:    Answer:   Sepsis   lactated ringers infusion   iohexol (OMNIPAQUE) 300 MG/ML solution 80 mL    -I have reviewed the patients home medicines and have made adjustments as needed  Critical interventions Fluid resuscitation, multiple antibiotics, external rewarming with bear hugger    Cardiac Monitoring: The patient was maintained on a cardiac monitor.  I personally viewed and interpreted the cardiac monitored which showed an underlying rhythm of: Sinus bradycardia  Social Determinants of Health:  Factors impacting patients care include: Currently living in skilled nursing facility   Reevaluation: After the interventions noted above, I reevaluated the patient and found that they have :improved  Co morbidities that complicate the patient evaluation  Past Medical History:  Diagnosis  Date   Diabetes mellitus    Glaucoma    Gout    Hyperlipidemia    Hypertension    Left foot pain       Dispostion: I considered admission for this patient, and disposition pending CT imaging but anticipate admission.     Final Clinical Impression(s) / ED Diagnoses Final diagnoses:  Sepsis, due to unspecified organism, unspecified whether acute organ dysfunction present [facility])  Metabolic encephalopathy  Acute cystitis without hematuria  Hypothyroidism, unspecified type  Hypothermia, initial encounter     '@PCDICTATION'$ @    Teressa Lower, MD 03/18/22 Vernelle Emerald

## 2022-03-18 NOTE — ED Provider Notes (Signed)
Pt signed out by Dr. Matilde Sprang pending labs and CT scans.  UA + for UTI.  Pt given vancomycin and maxipime as well as IVFs.    Temperature is gradually coming up with the bair hugger.  CT scans reviewed by me.  I agree with the radiologist.  CT head:  IMPRESSION:  No acute intracranial abnormality.    CT chest/abd/pelvis:  1. Heterogeneous and thickened endometrium. Recommend pelvic  ultrasound for further evaluation.  2. Heterogeneous partially calcified thyroid glands. In the setting  of significant comorbidities or limited life expectancy, no  follow-up recommended (ref: J Am Coll Radiol. 2015 Feb;12(2):  143-50).  3. Subpleural right upper lobe micronodule. No follow-up needed if  patient is low-risk.This recommendation follows the consensus  statement: Guidelines for Management of Incidental Pulmonary Nodules  Detected on CT Images: From the Fleischner Society 2017; Radiology  2017; 284:228-243.  4. Trace subcutaneus soft tissue emphysema along the anterior lower  abdominal wall suggestive of medication injections.    Pt d/w hositalist, Dr. Denton Brick, for admission.  CRITICAL CARE Performed by: Isla Pence   Total critical care time: 30 minutes  Critical care time was exclusive of separately billable procedures and treating other patients.  Critical care was necessary to treat or prevent imminent or life-threatening deterioration.  Critical care was time spent personally by me on the following activities: development of treatment plan with patient and/or surrogate as well as nursing, discussions with consultants, evaluation of patient's response to treatment, examination of patient, obtaining history from patient or surrogate, ordering and performing treatments and interventions, ordering and review of laboratory studies, ordering and review of radiographic studies, pulse oximetry and re-evaluation of patient's condition.          Isla Pence, MD 03/18/22  [phone]

## 2022-03-18 NOTE — Assessment & Plan Note (Signed)
Blood pressure markedly elevated in the 220s. -As needed hydralazine 10 mg for systolic greater than 498.

## 2022-03-18 NOTE — ED Notes (Signed)
Both sets of cultures drawn before antibiotic administration  

## 2022-03-18 NOTE — ED Triage Notes (Signed)
Pt arrived via [facility] from [facility] with c/o low temperature and AMS. Pt has been on an antibiotic for a few days for UT. CBG 114 per EMS. Pt answers questions but appears to be altered

## 2022-03-18 NOTE — H&P (Addendum)
History and Physical    Jackie Hickman RJJ:[id number] DOB: [birth year] DOA: 03/18/2022  PCP: System, Provider Not In   Patient coming from: [place]  I have personally briefly reviewed patient's old medical records in [place]  Chief Complaint: AMS  HPI: Jackie Hickman is a 88 y.o. female with medical history significant for diabetes mellitus, hypertension alcohol history. [facility] with reports of altered mental status and hypothermia.  Per nursing home, patient was diagnosed with UTI 3 days ago and was placed on Macrobid.  At the time of my evaluation, patient is somnolent and not answering questions.  The patient's daughter Jackie Hickman last saw her mom last 1 week ago and then today.  She believes alteration in mental status started at least 4 days ago when she was diagnosed with UTI and started on Macrobid.  At baseline patient has dementia, good and bad days, sometimes she recognizes family, sometimes she does not.  She ambulates with a wheelchair.  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  ED does not Course: Temperature 98.6.  Heart rate 43 improved to 60s as patient warmed up, respiratory rate 11-23.  Blood pressure systolic 016W to 109N.  Lactic acid 2.1 > 1.9..  Sodium 145.  WBC 3.5.  UA with moderate leukocytes few bacteria.  Chest x-ray showing atelectasis, CTA chest abdomen and pelvis with contrast without significant findings except for heterogeneous and thickened endometrium. TSH -markedly elevated at 30.15. -Broad-spectrum antibiotics IV vancomycin and cefepime started.  1 L bolus LR given. ,  With maintenance at 125.   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  Past Medical History:  Diagnosis Date   Diabetes mellitus    Glaucoma    Gout    Hyperlipidemia    Hypertension    Left foot pain     Past Surgical History:  Procedure Laterality Date   APPENDECTOMY  1948   BACK SURGERY      fusion of L5-6   COLONOSCOPY  08/2011   Dr. Silvio Pate: 6 mm sessile polyp in cecum (tubulovillous adenoma with HGD), 25 mm sessile polyp in ascending colon (Tubular adenoma). Piecemeal polypectomy    COLONOSCOPY  08/2012   Dr. Fuller Plan: multiple sessile polyps (6-12 mm), tubular adenoma and tubulovillous adenoma, piecemeal polypectomy    COLONOSCOPY  02/2013   Dr. Fuller Plan: post-polypectomy scar in ascending colon, otherwise normal    COLONOSCOPY N/A 02/25/2016   Procedure: COLONOSCOPY;  Surgeon: [name], MD;  [facility]: [facility];  Service: Endoscopy;  Laterality: N/A;  patient to receive phenergan 12.'5mg'$  IV on call to endo   ESOPHAGOGASTRODUODENOSCOPY  2005   Dr. Fuller Plan: gastritis    ESOPHAGOGASTRODUODENOSCOPY N/A 02/25/2016   Procedure: ESOPHAGOGASTRODUODENOSCOPY (EGD);  Surgeon: [name], MD;  Location: [facility];  Service: Endoscopy;  Laterality: N/A;  patient to receive phenergan 12.'5mg'$  IV on call to endo   EYE SURGERY     bleeding and glaucoma   LAPAROSCOPIC CHOLECYSTECTOMY  1980     reports that she has never smoked. She has never used smokeless tobacco. She reports current alcohol use. She reports that she does not use drugs.  No Known Allergies  Family History  Problem Relation Age of Onset   Alzheimer's disease Mother 35   Colon cancer Neg Hx    Stomach cancer Neg Hx     Prior to Admission medications   Medication Sig Start Date End  Date Taking? Authorizing Provider  allopurinol (ZYLOPRIM) 100 MG tablet Take 100 mg by mouth daily. 01/26/22  Yes [provider]  brimonidine (ALPHAGAN) 0.2 % ophthalmic solution Apply to eye. 04/30/14  Yes [provider]  brimonidine-timolol (COMBIGAN) 0.2-0.5 % ophthalmic solution Place 1 drop into both eyes every 12 (twelve) hours.   Yes [provider]  cefTRIAXone (ROCEPHIN) 2 g injection Inject 2 g into the muscle daily. 03/18/22  Yes [provider]  docusate sodium (COLACE) 100 MG  capsule Take 100 mg by mouth daily.   Yes [provider]  fosfomycin (MONUROL) 3 g PACK Take by mouth.   Yes [provider]  latanoprost (XALATAN) 0.005 % ophthalmic solution Place 1 drop into both eyes at bedtime.   Yes [provider]  nebivolol (BYSTOLIC) 10 MG tablet Take 2 tablets (20 mg total) by mouth daily. Patient taking differently: Take 10 mg by mouth daily. 05/12/15  Yes Martin, [name], FNP  nitrofurantoin, macrocrystal-monohydrate, (MACROBID) 100 MG capsule Take 100 mg by mouth 2 (two) times daily. For 7 days start date 03/15/22   Yes [provider]  prednisoLONE acetate (PRED FORTE) 1 % ophthalmic suspension Place 1 drop into the right eye at bedtime. 04/30/14  Yes [provider]  ROCKLATAN 0.02-0.005 % SOLN Place 1 drop into the right eye at bedtime. 02/23/22  Yes [provider]  sodium chloride (MURO 128) 5 % ophthalmic solution Place 1 drop into the right eye 2 (two) times daily.   Yes [provider]  TRULICITY 0.21 JD/5.5MC SOPN Inject into the skin. 02/16/22  Yes [provider]  fenofibrate (TRICOR) 145 MG tablet Take 1 tablet (145 mg total) by mouth daily. Patient not taking: Reported on 03/18/2022 05/12/15   [name], FNP  HYDROcodone-acetaminophen (NORCO/VICODIN) 5-325 MG tablet 1 tab PO q12 hours prn pain Patient not taking: Reported on 03/18/2022 02/26/16   [name], MD    Physical Exam: Limited exam due to altered mental status Vitals:   03/18/22 1900 03/18/22 1915 03/18/22 2000 03/18/22 2041  BP: (!) 197/106  (!) 183/111 (!) 210/133  Pulse: 61 64 64 66  Resp: 12 15 (!) 21 20  Temp: (!) 91.9 F (33.3 C) (!) 91.9 F (33.3 C) (!) 92.3 F (33.5 C) (!) 92.5 F (33.6 C)  TempSrc:    Bladder  SpO2: 100% 100% 100% 100%  Weight:    77.5 kg  Height:    '5\' 2"'$  (1.575 m)    Constitutional: Somnolent Vitals:   03/18/22 1900 03/18/22 1915 03/18/22 2000 03/18/22 2041  BP:  (!) 197/106  (!) 183/111 (!) 210/133  Pulse: 61 64 64 66  Resp: 12 15 (!) 21 20  Temp: (!) 91.9 F (33.3 C) (!) 91.9 F (33.3 C) (!) 92.3 F (33.5 C) (!) 92.5 F (33.6 C)  TempSrc:    Bladder  SpO2: 100% 100% 100% 100%  Weight:    77.5 kg  Height:    '5\' 2"'$  (1.575 m)   Eyes: Patient squeezing eyes shut not cooperative with exam, but cornea bilateral appear scarred. ENMT: Mucous membranes are dry. .  Neck: normal, supple, no masses, no thyromegaly Respiratory: clear to auscultation bilaterally, no wheezing, no crackles. Normal respiratory effort. No accessory muscle use.  Cardiovascular: Regular rate and rhythm, no murmurs / rubs / gallops. No extremity edema.  Extremities warm. Abdomen: no tenderness, no masses palpated. No hepatosplenomegaly. Bowel sounds positive.  Musculoskeletal: no clubbing / cyanosis. No joint deformity upper  and lower extremities.  Skin: no rashes, lesions, ulcers. No induration Neurologic: Withdrawing bilateral lower extremity to stimulation, mild movement of bilateral upper extremities to stimulation, purposeful squeezing eyes tightly shut  psychiatric:  Somnolent  Labs on Admission: I have personally reviewed following labs and imaging studies  CBC: Recent Labs  Lab 03/18/22 1422 03/18/22 1435  WBC 3.5*  --   NEUTROABS 2.2  --   HGB 12.4 12.9  HCT 38.9 38.0  MCV 94.0  --   PLT 81*  --    Basic Metabolic Panel: Recent Labs  Lab 03/18/22 1422 03/18/22 1435  NA 145 147*  K 3.5 4.9  CL 114* 115*  CO2 24  --   GLUCOSE 118* 116*  BUN 40* 52*  CREATININE 1.26* 1.30*  CALCIUM 8.9  --    GFR: Estimated Creatinine Clearance: 29.4 mL/min (A) (by C-G formula based on SCr of 1.3 mg/dL (H)). Liver Function Tests: Recent Labs  Lab 03/18/22 1422  AST 39  ALT 49*  ALKPHOS 88  BILITOT 0.3  PROT 7.2  ALBUMIN 3.2*   Coagulation Profile: Recent Labs  Lab 03/18/22 1657  INR 1.0   CBG: Recent Labs  Lab 03/18/22 2027  GLUCAP 113*    Thyroid Function Tests: Recent Labs    03/18/22 1450  TSH 30.150*   Urine analysis:    Component Value Date/Time   COLORURINE YELLOW 03/18/2022 1430   APPEARANCEUR CLOUDY (A) 03/18/2022 1430   LABSPEC 1.012 03/18/2022 1430   PHURINE 8.0 03/18/2022 1430   GLUCOSEU NEGATIVE 03/18/2022 1430   HGBUR SMALL (A) 03/18/2022 1430   BILIRUBINUR NEGATIVE 03/18/2022 1430   BILIRUBINUR neg 04/24/2013 0933   KETONESUR NEGATIVE 03/18/2022 1430   PROTEINUR 100 (A) 03/18/2022 1430   UROBILINOGEN negative 04/24/2013 0933   NITRITE NEGATIVE 03/18/2022 1430   LEUKOCYTESUR MODERATE (A) 03/18/2022 1430    Radiological Exams on Admission: CT Head Wo Contrast  Result Date: 03/18/2022 CLINICAL DATA:  Delirium EXAM: CT HEAD WITHOUT CONTRAST TECHNIQUE: Contiguous axial images were obtained from the base of the skull through the vertex without intravenous contrast. RADIATION DOSE REDUCTION: This exam was performed according to the departmental dose-optimization program which includes automated exposure control, adjustment of the mA and/or kV according to patient size and/or use of iterative reconstruction technique. COMPARISON:  CT head 05/23/2010 report without imaging BRAIN: BRAIN Cerebral ventricle sizes are concordant with the degree of cerebral volume loss. Patchy and confluent areas of decreased attenuation are noted throughout the deep and periventricular white matter of the cerebral hemispheres bilaterally, compatible with chronic microvascular ischemic disease. No evidence of large-territorial acute infarction. No parenchymal hemorrhage. No mass lesion. No extra-axial collection. No mass effect or midline shift. No hydrocephalus. Basilar cisterns are patent. Vascular: No hyperdense vessel. Atherosclerotic calcifications are present within the cavernous internal carotid arteries. Skull: No acute fracture or focal lesion. Sinuses/Orbits: Left maxillary sinus mucosal thickening. Otherwise paranasal sinuses  and mastoid air cells are clear. Bilateral lens replacement. Left scleral buckle. Otherwise the orbits are unremarkable. Other: None. IMPRESSION: No acute intracranial abnormality. Electronically Signed   By: Iven Finn M.D.   On: 03/18/2022 18:07   CT CHEST ABDOMEN PELVIS W CONTRAST  Result Date: 03/18/2022 CLINICAL DATA:  Sepsis EXAM: CT CHEST, ABDOMEN, AND PELVIS WITH CONTRAST TECHNIQUE: Multidetector CT imaging of the chest, abdomen and pelvis was performed following the standard protocol during bolus administration of intravenous contrast. RADIATION DOSE REDUCTION: This exam was performed according to the departmental dose-optimization program which includes  automated exposure control, adjustment of the mA and/or kV according to patient size and/or use of iterative reconstruction technique. CONTRAST:  36m OMNIPAQUE IOHEXOL 300 MG/ML  SOLN COMPARISON:  None Available. FINDINGS: CT CHEST FINDINGS Cardiovascular: Normal heart size. No significant pericardial effusion. The thoracic aorta is normal in caliber. Severe atherosclerotic plaque of the thoracic aorta. At least 2 vessel coronary artery calcifications. The main pulmonary artery is normal in caliber. No central pulmonary embolus. Mediastinum/Nodes: No enlarged mediastinal, hilar, or axillary lymph nodes. Heterogeneous partially calcified thyroid glands. Trachea and esophagus demonstrate no significant findings. Lungs/Pleura: Expiratory phase of respiration. Bilateral lower lobe atelectasis. No focal consolidation. Subpleural right upper lobe micronodule (4:62, 5:85). No pulmonary mass. No pleural effusion. No pneumothorax. Musculoskeletal: No chest wall abnormality. No suspicious lytic or blastic osseous lesions. No acute displaced fracture. CT ABDOMEN PELVIS FINDINGS Hepatobiliary: No focal liver abnormality. Status post cholecystectomy. No intrahepatic biliary dilatation. Mild extrahepatic biliary ductal dilatation which can be seen in the post  cholecystectomy setting. Pancreas: Diffusely atrophic. No focal lesion. Otherwise normal pancreatic contour. No surrounding inflammatory changes. No main pancreatic ductal dilatation. Spleen: Normal in size without focal abnormality. Adrenals/Urinary Tract: No adrenal nodule bilaterally. Bilateral kidneys enhance symmetrically. No hydronephrosis. No hydroureter. The urinary bladder is unremarkable. Foley catheter tip and balloon terminate within the urinary bladder lumen. On delayed imaging, there is no urothelial wall thickening and there are no filling defects in the opacified portions of the bilateral collecting systems or ureters. Stomach/Bowel: Stomach is within normal limits. No evidence of bowel wall thickening or dilatation. Appendix appears normal. Vascular/Lymphatic: No abdominal aorta or iliac aneurysm. Severe atherosclerotic plaque of the aorta and its branches. No abdominal, pelvic, or inguinal lymphadenopathy. Reproductive: Pericentimeter intramural calcification likely representative of a degenerative uterine fibroid. Heterogeneous in thickened (12 mm). Endometrium. Otherwise uterus and bilateral adnexa are unremarkable. Other: No intraperitoneal free fluid. No intraperitoneal free gas. No organized fluid collection. Musculoskeletal: No abdominal wall hernia or abnormality. Trace subcutaneus soft tissue emphysema along the anterior lower abdominal wall suggestive of medication injections (2:80). No suspicious lytic or blastic osseous lesions. No acute displaced fracture. Grade 1 anterolisthesis of L4 on L5. Intervertebral disc space vacuum phenomenon at the L4-L5 and L5-S1 levels. Posterior disc osteophyte complex formation at the L5 on S1 level. IMPRESSION: 1. Heterogeneous and thickened endometrium. Recommend pelvic ultrasound for further evaluation. 2. Heterogeneous partially calcified thyroid glands. In the setting of significant comorbidities or limited life expectancy, no follow-up recommended  (ref: J Am Coll Radiol. 2015 Feb;12(2): 143-50). 3. Subpleural right upper lobe micronodule. No follow-up needed if patient is low-risk.This recommendation follows the consensus statement: Guidelines for Management of Incidental Pulmonary Nodules Detected on CT Images: From the Fleischner Society 2017; Radiology 2017; 284:228-243. 4. Trace subcutaneus soft tissue emphysema along the anterior lower abdominal wall suggestive of medication injections. Electronically Signed   By: MIven FinnM.D.   On: 03/18/2022 18:03   DG Chest Port 1 View  Result Date: 03/18/2022 CLINICAL DATA:  Questionable sepsis in an 88year old female. EXAM: PORTABLE CHEST 1 VIEW COMPARISON:  None available. FINDINGS: EKG leads project over the chest. Image rotated slightly to the RIGHT. Cardiomediastinal contours and hilar structures unremarkable to the extent evaluated on this nonstandard AP projection accounting for rotation. No lobar level consolidation. Scattered airspace opacities throughout the chest mainly with linear appearance. No pneumothorax. On limited assessment there is no acute skeletal process. IMPRESSION: Scattered airspace opacities throughout the chest mainly with linear appearance. Findings favor scattered atelectasis.  Difficult to exclude infection at the lung bases. Electronically Signed   By: Zetta Bills M.D.   On: 03/18/2022 15:22    EKG: Independently reviewed.  First-degree AV block, rate 43, cardiac, PR interval 347.  LBBB not present on prior EKG -2017.  Assessment/Plan Principal Problem:   Myxedema coma (HCC) Active Problems:   Essential hypertension   Type 2 diabetes mellitus with renal manifestations (HCC)   UTI (urinary tract infection)   Acute metabolic encephalopathy  Assessment and Plan: * Myxedema coma ([place]) Patient's somnolent, from nursing home.  Hypothermic down to 86.9, bradycardic down to 43.  TSH markedly elevated at 38.1.  No prior TSH on file.  CT negative for acute  abnormality. -IV Synthroid 300 mg x 1 -Pharmacy to assist with dosing of Synthroid -10 continue Synthroid at 50 mcg IV daily until patient improves clinically and can transition to oral therapy. - Check Serum cortisol, T3 and free T4 levels -IV hydrocortisone 100 mg 3 times daily - Rule out adrenal insufficiency -Remain n.p.o. -Foley - Consider phone consultation with outpatient endocrinologist Dr. Dorris Fetch -Considering patient's poor baseline status, advanced age, she will be admitted here.  Consider critical care evaluation in a.m. also. -Patient's HCPOA is her daughter- [name] Ngom, prognosis, plan of care explained to patient's daughter in detail.  Thrombocytopenia ([place]) 81.  Dementia ([place]) At baseline sometimes recognizes family, sometimes able to answer questions.  Ambulates with wheelchair.  Acute metabolic encephalopathy Secondary to myxedema coma.  Possibly UTI.  Head CT negative for acute abnormality.  UTI (urinary tract infection) Diagnosed with UTI 2 days ago and was placed on Macrobid.  UA today with moderate leukocytes few bacteria.  No prior urine cultures on file.  WBC 3.5.  Hypothermic.  Likely from myxedema coma rather than sepsis.  Lactic acid 2.1 > 1.8. -Continue with IV cefepime  - 1.5 Liter LR bolus given, continue LR '@75cc'$ /hr  Type 2 diabetes mellitus with renal manifestations (HCC) - [id number] -Hold Trulicity - SSI -S  Essential hypertension Blood pressure markedly elevated in the 220s. -As needed hydralazine 10 mg for systolic greater than 967.    DVT prophylaxis: SCDs Code Status: DNR.  DNR form at bedside, re-confirmed with patient's daughter on the phone. Family Communication: Talked to patient's daughter [name] Ngom-patient's HCPOA.  Poor prognosis, plan of care explained to the daughter in detail.  She verbalized understanding. Disposition Plan: > 2 days Consults called: None Admission status: Inpt Step down I certify that at the point of admission  it is my clinical judgment that the patient will require inpatient hospital care spanning beyond 2 midnights from the point of admission due to high intensity of service, high risk for further deterioration and high frequency of surveillance required.    Author: Bethena Roys, MD 03/18/2022 11:01 PM  For on call review [email or web address].

## 2022-03-18 NOTE — Assessment & Plan Note (Addendum)
Diagnosed with UTI 2 days ago and was placed on Macrobid.  UA today with moderate leukocytes few bacteria.  No prior urine cultures on file.  WBC 3.5.  Hypothermic.  Likely from myxedema coma rather than sepsis.  Lactic acid 2.1 > 1.8. -Continue with IV cefepime  - 1.5 Liter LR bolus given, continue LR '@75cc'$ /hr

## 2022-03-18 NOTE — Progress Notes (Signed)
Pharmacy Antibiotic Note  Jackie Hickman is a 88 y.o. female admitted on 03/18/2022 with sepsis.  Pharmacy has been consulted for Vancomycin dosing.  Plan: Vancomycin '1750mg'$  IV loading dose then 1000 mg IV Q 48 hrs. Goal AUC 400-550. Expected AUC: 449 SCr used: 1.3  F/u other abx Monitor V/S, labs and levels as indicated  Height: '5\' 2"'$  (157.5 cm) Weight: 91.2 kg (201 lb 1 oz) IBW/kg (Calculated) : 50.1  Temp (24hrs), Avg:87.1 F (30.6 C), Min:86.2 F (30.1 C), Max:88.2 F (31.2 C)  Recent Labs  Lab 03/18/22 1422 03/18/22 1435  WBC 3.5*  --   CREATININE 1.26* 1.30*  LATICACIDVEN 2.1*  --     Estimated Creatinine Clearance: 32 mL/min (A) (by C-G formula based on SCr of 1.3 mg/dL (H)).    No Known Allergies  Antimicrobials this admission: Vancomycin 12/14 >>  cefepime 12/14 x 1 dose   Microbiology results: 12/14 BCx: pending  MRSA PCR:   Thank you for allowing pharmacy to be a part of this patient's care.  Isac Sarna, BS Pharm D, BCPS Clinical Pharmacist 03/18/2022 3:58 PM

## 2022-03-18 NOTE — ED Notes (Signed)
Rectal Temperature 86.9--MD made aware

## 2022-03-18 NOTE — Progress Notes (Signed)
Pharmacy Consult Note  43 yof with hx DM, HTN, HLD presenting with AMS. Pharmacy consulted to dose Synthoid IV for myxedema coma. TSH 30 on presentation. Synthroid 1x dose ordered, and patient initiated on stress-dose steroids per MD.  Plan: Synthroid 319mg IV x 1 as slow bolus already ordered per MD Continue Synthroid 537m IV daily until patient improves clinically and can transition to oral therapy Continue stress steroids until possibility of coexisting adrenal insufficiency excluded   HaArturo MortonPharmD, BCPS Please check AMION for all [place] numbers Clinical Pharmacist 03/18/2022 6:58 PM

## 2022-03-18 NOTE — Assessment & Plan Note (Signed)
83. 

## 2022-03-18 NOTE — Progress Notes (Signed)
Pt arrive to unit 2025, opens eyes and is responsive to stimuli but non-verbal at this time, pt also very hypertensive, notified Dr. Denton Brick of pt arrival and need for orders.

## 2022-03-18 NOTE — Assessment & Plan Note (Signed)
Blood pressure persistently elevated after 10 mg of Iv hydralazine.  Still in 224V systolic.  Will start Cardene drip.

## 2022-03-18 NOTE — Assessment & Plan Note (Addendum)
Patient's somnolent, from nursing home.  Hypothermic down to 86.9, bradycardic down to 43.  TSH markedly elevated at 38.1.  No prior TSH on file.  CT negative for acute abnormality. -IV Synthroid 300 mg x 1 -Pharmacy to assist with dosing of Synthroid -10 continue Synthroid at 50 mcg IV daily until patient improves clinically and can transition to oral therapy. - Check Serum cortisol, T3 and free T4 levels -IV hydrocortisone 100 mg 3 times daily - Rule out adrenal insufficiency -Remain n.p.o. -Foley - Consider phone consultation with outpatient endocrinologist Dr. Dorris Fetch -Considering patient's poor baseline status, advanced age, she will be admitted here.  Consider critical care evaluation in a.m. also. -Patient's HCPOA is her daughter- Jackie Hickman, prognosis, plan of care explained to patient's daughter in detail.

## 2022-03-18 NOTE — ED Notes (Signed)
Bear hugger applied to pt for rectal temperature of 86.9 F

## 2022-03-18 NOTE — Assessment & Plan Note (Addendum)
Secondary to myxedema coma.  Possibly UTI.  Head CT negative for acute abnormality.

## 2022-03-18 NOTE — Assessment & Plan Note (Signed)
At baseline sometimes recognizes family, sometimes able to answer questions.  Ambulates with wheelchair.

## 2022-03-18 NOTE — Assessment & Plan Note (Deleted)
Markedly elevated up to 220. -Hold home nebivolol while n.p.o. -As needed hydralazine 10 mg for systolic greater than 909

## 2022-03-19 ENCOUNTER — Encounter (HOSPITAL_COMMUNITY): Payer: Self-pay | Admitting: Internal Medicine

## 2022-03-19 ENCOUNTER — Inpatient Hospital Stay (HOSPITAL_COMMUNITY)
Admit: 2022-03-19 | Discharge: 2022-03-19 | Disposition: A | Discharge status: Home / Self Care | Payer: Medicare Other | Attending: Internal Medicine | Admitting: Internal Medicine

## 2022-03-19 ENCOUNTER — Inpatient Hospital Stay (HOSPITAL_COMMUNITY): Payer: Medicare Other

## 2022-03-19 DIAGNOSIS — T68XXXA Hypothermia, initial encounter: Secondary | ICD-10-CM

## 2022-03-19 DIAGNOSIS — N390 Urinary tract infection, site not specified: Secondary | ICD-10-CM

## 2022-03-19 DIAGNOSIS — R4182 Altered mental status, unspecified: Secondary | ICD-10-CM | POA: Diagnosis not present

## 2022-03-19 DIAGNOSIS — R319 Hematuria, unspecified: Secondary | ICD-10-CM

## 2022-03-19 DIAGNOSIS — A419 Sepsis, unspecified organism: Secondary | ICD-10-CM

## 2022-03-19 LAB — GLUCOSE, CAPILLARY
Glucose-Capillary: 102 mg/dL — ABNORMAL HIGH (ref 70–99)
Glucose-Capillary: 118 mg/dL — ABNORMAL HIGH (ref 70–99)
Glucose-Capillary: 136 mg/dL — ABNORMAL HIGH (ref 70–99)
Glucose-Capillary: 170 mg/dL — ABNORMAL HIGH (ref 70–99)

## 2022-03-19 LAB — BLOOD GAS, ARTERIAL
Acid-base deficit: 3.8 mmol/L — ABNORMAL HIGH (ref 0.0–2.0)
Bicarbonate: 18.8 mmol/L — ABNORMAL LOW (ref 20.0–28.0)
Drawn by: 27016
FIO2: 21 %
O2 Saturation: 97.9 %
Patient temperature: 37
pCO2 arterial: 27 mmHg — ABNORMAL LOW (ref 32–48)
pH, Arterial: 7.45 (ref 7.35–7.45)
pO2, Arterial: 100 mmHg (ref 83–108)

## 2022-03-19 LAB — BASIC METABOLIC PANEL
Anion gap: 8 (ref 5–15)
Anion gap: 14 (ref 5–15)
BUN: 36 mg/dL — ABNORMAL HIGH (ref 8–23)
BUN: 38 mg/dL — ABNORMAL HIGH (ref 8–23)
CO2: 19 mmol/L — ABNORMAL LOW (ref 22–32)
CO2: 16 mmol/L — ABNORMAL LOW (ref 22–32)
Calcium: 8.5 mg/dL — ABNORMAL LOW (ref 8.9–10.3)
Calcium: 8.7 mg/dL — ABNORMAL LOW (ref 8.9–10.3)
Chloride: 117 mmol/L — ABNORMAL HIGH (ref 98–111)
Chloride: 116 mmol/L — ABNORMAL HIGH (ref 98–111)
Creatinine, Ser: 1.3 mg/dL — ABNORMAL HIGH (ref 0.44–1.00)
Creatinine, Ser: 1.4 mg/dL — ABNORMAL HIGH (ref 0.44–1.00)
GFR, Estimated: 40 mL/min — ABNORMAL LOW (ref >60)
GFR, Estimated: 36 mL/min — ABNORMAL LOW (ref >60)
Glucose, Bld: 169 mg/dL — ABNORMAL HIGH (ref 70–99)
Glucose, Bld: 187 mg/dL — ABNORMAL HIGH (ref 70–99)
Potassium: 3.6 mmol/L (ref 3.5–5.1)
Potassium: 3.4 mmol/L — ABNORMAL LOW (ref 3.5–5.1)
Sodium: 144 mmol/L (ref 135–145)
Sodium: 146 mmol/L — ABNORMAL HIGH (ref 135–145)

## 2022-03-19 LAB — CBC
HCT: 32.9 % — ABNORMAL LOW (ref 36.0–46.0)
Hemoglobin: 10.6 g/dL — ABNORMAL LOW (ref 12.0–15.0)
MCH: 29.9 pg (ref 26.0–34.0)
MCHC: 32.2 g/dL (ref 30.0–36.0)
MCV: 92.7 fL (ref 80.0–100.0)
Platelets: 84 10*3/uL — ABNORMAL LOW (ref 150–400)
RBC: 3.55 MIL/uL — ABNORMAL LOW (ref 3.87–5.11)
RDW: 17.2 % — ABNORMAL HIGH (ref 11.5–15.5)
WBC: 7.3 10*3/uL (ref 4.0–10.5)
nRBC: 3 % — ABNORMAL HIGH (ref 0.0–0.2)

## 2022-03-19 LAB — AMMONIA: Ammonia: 23 umol/L (ref 9–35)

## 2022-03-19 LAB — MAGNESIUM: Magnesium: 1.6 mg/dL — ABNORMAL LOW (ref 1.7–2.4)

## 2022-03-19 LAB — T4, FREE: Free T4: 2.18 ng/dL — ABNORMAL HIGH (ref 0.61–1.12)

## 2022-03-19 LAB — CORTISOL: Cortisol, Plasma: 66.2 ug/dL

## 2022-03-19 LAB — CK: Total CK: 15 U/L — ABNORMAL LOW (ref 38–234)

## 2022-03-19 LAB — MRSA NEXT GEN BY PCR, NASAL: MRSA by PCR Next Gen: DETECTED — AB

## 2022-03-19 LAB — FOLATE: Folate: 8.3 ng/mL (ref >5.9)

## 2022-03-19 LAB — PROCALCITONIN: Procalcitonin: <0.1 ng/mL

## 2022-03-19 LAB — VITAMIN B12: Vitamin B-12: 689 pg/mL (ref 180–914)

## 2022-03-19 MED ORDER — POTASSIUM CHLORIDE IN NACL 20-0.45 MEQ/L-% IV SOLN
INTRAVENOUS | Status: DC
  Filled 2022-03-19 (×8): qty 1000

## 2022-03-19 MED ORDER — THIAMINE HCL 100 MG/ML IJ SOLN
500.0000 mg | Freq: Three times a day (TID) | INTRAVENOUS | Status: AC
  Administered 2022-03-19 – 2022-03-21 (×6): 500 mg via INTRAVENOUS
  Filled 2022-03-19 (×6): qty 5

## 2022-03-19 MED ORDER — MAGNESIUM SULFATE 2 GM/50ML IV SOLN
2.0000 g | Freq: Once | INTRAVENOUS | Status: AC
  Administered 2022-03-19: 2 g via INTRAVENOUS
  Filled 2022-03-19: qty 50

## 2022-03-19 MED ORDER — MUPIROCIN 2 % EX OINT
TOPICAL_OINTMENT | Freq: Two times a day (BID) | CUTANEOUS | Status: DC
  Administered 2022-03-21: 1 via NASAL
  Filled 2022-03-19: qty 22

## 2022-03-19 MED ORDER — PIPERACILLIN-TAZOBACTAM 3.375 G IVPB
3.3750 g | Freq: Three times a day (TID) | INTRAVENOUS | Status: DC
  Administered 2022-03-19 – 2022-03-20 (×2): 3.375 g via INTRAVENOUS
  Filled 2022-03-19 (×2): qty 50

## 2022-03-19 NOTE — Progress Notes (Signed)
EEG complete - results pending 

## 2022-03-19 NOTE — Progress Notes (Signed)
EEG technician at bedside currently

## 2022-03-19 NOTE — NC FL2 (Signed)
[place] LEVEL OF CARE FORM     IDENTIFICATION  Patient Name: Jackie Hickman Birthdate: 1932/07/27 Sex: female Admission Date (Current Location): 03/18/2022  [facility] and [place] Number:  [employer] and Address:  [place] [address] [place]      Provider Number: [id number]  Attending Physician Name and Address:  Orson Eva, MD  Relative Name and Phone Number:       Current Level of Care: Hospital Recommended Level of Care: [place] Prior Approval Number:    Date Approved/Denied:   PASRR Number:    Discharge Plan: SNF    Current Diagnoses: Patient Active Problem List   Diagnosis Date Noted   Sepsis ([place]) 03/19/2022   Hypothermia 03/19/2022   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   UTI (urinary tract infection) 01/75/1025   Acute metabolic encephalopathy 85/27/7824   Dementia ([place]) 03/18/2022   Thrombocytopenia ([place]) 03/18/2022   Hypomagnesemia 02/24/2016   Normocytic anemia 02/23/2016   Hypokalemia 02/23/2016   Protein-calorie malnutrition ([place]) 02/23/2016   Acute kidney injury superimposed on chronic kidney disease ([place]) 02/23/2016   Alcohol abuse 02/23/2016   Incontinence 02/23/2016   BMI 26.0-26.9,adult 11/07/2014   Essential hypertension 04/24/2013   Type 2 diabetes mellitus with renal manifestations ([place]) 04/24/2013   Hyperlipidemia 04/24/2013    Orientation RESPIRATION BLADDER Height & Weight     Self  Normal Indwelling catheter Weight: 170 lb 13.7 oz (77.5 kg) Height:  '5\' 2"'$  (157.5 cm)  BEHAVIORAL SYMPTOMS/MOOD NEUROLOGICAL BOWEL NUTRITION STATUS      Incontinent Diet (see dc summary)  AMBULATORY STATUS COMMUNICATION OF NEEDS Skin   Extensive Assist   Normal                       Personal Care Assistance Level of Assistance  Bathing, Feeding, Dressing Bathing Assistance: Maximum assistance Feeding assistance: Limited assistance Dressing Assistance: Maximum assistance      Functional Limitations Info  Sight, Hearing, Speech Sight Info: Impaired Hearing Info: Adequate Speech Info: Adequate    SPECIAL CARE FACTORS FREQUENCY                       Contractures Contractures Info: Not present    Additional Factors Info  Code Status, Allergies Code Status Info: DNR Allergies Info: NKA           Current Medications (03/19/2022):  This is the current hospital active medication list Current Facility-Administered Medications  Medication Dose Route Frequency Provider Last Rate Last Admin   0.45 % NaCl with KCl 20 mEq / L infusion   Intravenous Continuous Tat, David, MD 100 mL/hr at 03/19/22 0950 New Bag at 03/19/22 0950   acetaminophen (TYLENOL) tablet 650 mg  650 mg Oral Q6H PRN Emokpae, Ejiroghene E, MD       Or   acetaminophen (TYLENOL) suppository 650 mg  650 mg Rectal Q6H PRN Emokpae, Ejiroghene E, MD       ceFEPIme (MAXIPIME) 2 g in sodium chloride 0.9 % 100 mL IVPB  2 g Intravenous Q24H Emokpae, Ejiroghene E, MD       Chlorhexidine Gluconate Cloth 2 % PADS 6 each  6 each Topical Daily Emokpae, Ejiroghene E, MD   6 each at 03/19/22 0920   hydrALAZINE (APRESOLINE) injection 10 mg  10 mg Intravenous Q4H PRN Emokpae, Ejiroghene E, MD   10 mg at 03/18/22 2201   hydrocortisone sodium succinate (SOLU-CORTEF) 100 MG  injection 100 mg  100 mg Intravenous Q8H Emokpae, Ejiroghene E, MD   100 mg at 03/19/22 1028   insulin aspart (novoLOG) injection 0-9 Units  0-9 Units Subcutaneous Q6H Emokpae, Ejiroghene E, MD   1 Units at 03/19/22 1027   levothyroxine (SYNTHROID, LEVOTHROID) injection 50 mcg  50 mcg Intravenous Daily Emokpae, Ejiroghene E, MD   50 mcg at 03/19/22 0920   mupirocin ointment (BACTROBAN) 2 %   Nasal BID Tat, Shanon Brow, MD   Given at 03/19/22 0920   ondansetron (ZOFRAN) tablet 4 mg  4 mg Oral Q6H PRN Emokpae, Ejiroghene E, MD       Or   ondansetron (ZOFRAN) injection 4 mg  4 mg Intravenous Q6H PRN Emokpae, Ejiroghene E, MD       thiamine  (VITAMIN B1) 500 mg in normal saline (50 mL) IVPB  500 mg Intravenous Q8H Tat, Shanon Brow, MD       Derrill Memo ON 03/20/2022] vancomycin (VANCOCIN) IVPB 1000 mg/200 mL premix  1,000 mg Intravenous Q48H Emokpae, Ejiroghene E, MD         Discharge Medications: Please see discharge summary for a list of discharge medications.  Relevant Imaging Results:  Relevant Lab Results:   Additional Information    Salome Arnt, LCSW

## 2022-03-19 NOTE — Progress Notes (Signed)
PROGRESS NOTE  Jackie Hickman WGN:[id number] DOB: 1932-11-01 DOA: 03/18/2022 PCP: System, Provider Not In  Brief History:   88 y.o. female with medical history significant for diabetes mellitus, hypertension alcohol history. [facility]es from [facility] with reports of altered mental status and hypothermia.  Per nursing home, patient was diagnosed with UTI 3 days ago and was placed on Macrobid.  At the time of my evaluation, patient is somnolent and not answering questions.   The patient's daughter Jackie Hickman last saw her mom last 1 week ago and then today.  She believes alteration in mental status started at least 4 days ago when she was diagnosed with UTI and started on Macrobid.  At baseline patient has dementia, good and bad days, sometimes she recognizes family, sometimes she does not.  She ambulates with a wheelchair.  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   ED does not Course: Temperature 98.6.  Heart rate 43 improved to 60s as patient warmed up, respiratory rate 11-23.  Blood pressure systolic 784O to 962X.  Lactic acid 2.1 > 1.9..  Sodium 145.  WBC 3.5.  UA with moderate leukocytes few bacteria.  Chest x-ray showing atelectasis, CTA chest abdomen and pelvis with contrast without significant findings except for heterogeneous and thickened endometrium. TSH -markedly elevated at 30.15. -Broad-spectrum antibiotics IV vancomycin and cefepime started.  1 L bolus LR given. ,  With maintenance at 125.   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 -Continue empiric vancomycin and cefepime pending culture data  UTI -UA >50 WBC -Continue vancomycin and cefepime pending culture data  Acute metabolic encephalopathy -Continues  to be obtunded -Multifactorial including sepsis and possible myxedema coma -Continue antibiotics -ABG--7.4 5/27/100/18 on room air -B12 -Check ammonia -Folic acid -MRI brain -CT head negative for acute findings -start thiamine given Etoh hx  Hypothyroidism/myxedema -Continue IV Synthroid -She was given Synthroid IV 200 mcg x 1 -Repeat thyroid function studies 03/20/2022 -Continue Solu-Cortef -Follow-up cortisol  CKD stage IIIb -Baseline creatinine 1.2-1.4 -Monitor serial BMP  Diabetes mellitus type 2, controlled -NovoLog sliding scale -Hemoglobin B2W -Was on Trulicity outpatient  Thrombocytopenia -Secondary to infectious process -U13 -Folic acid  Essential hypertension -Holding Bystolic secondary to soft blood pressure  Major neurocognitive disorder -Baseline as discussed above in the history        Family Communication:   daughter updated 12/15  Consultants:  none  Code Status:  DNR  DVT Prophylaxis:  SCDs   Procedures: As Listed in Progress Note Above  Antibiotics: Vanc 12/14>> Cefepime 12/14>>    Subjective: Review of systems unobtainable secondary to the patient's encephalopathy.  Objective: Vitals:   03/19/22 0700 03/19/22 0800 03/19/22 0900 03/19/22 1000  BP: (!) 96/47 (!) 97/44 (!) 104/54 (!) 104/48  Pulse: 90 88 86 85  Resp: (!) 26 (!) 24 (!) 21 (!) 25  Temp: 97.6 F (36.4 C)   (!) 97.5 F (36.4 C)  TempSrc: Bladder     SpO2: 99% 100% 99% 99%  Weight:      Height:        Intake/Output Summary (Last 24 hours) at 03/19/2022 1024 Last data filed at 03/19/2022 2440 Gross per 24 hour  Intake 2022.52 ml  Output 900 ml  Net 1122.52 ml   Weight  change:  Exam:  General:  Pt is somnolent.  Arouses only with protopathic stimuli. HEENT: No icterus, No thrush, No neck mass, [place]/AT Cardiovascular: RRR, S1/S2, no rubs, no gallops Respiratory: Bibasilar rales.  No wheezing Abdomen: Soft/+BS, non tender, non distended, no  guarding Extremities: No edema, No lymphangitis, No petechiae, No rashes, no synovitis   Data Reviewed: I have personally reviewed following labs and imaging studies Basic Metabolic Panel: Recent Labs  Lab 03/18/22 1422 03/18/22 1435 03/18/22 1450 03/18/22 2318 03/19/22 0439  NA 145 147*  --  144 146*  K 3.5 4.9  --  3.6 3.4*  CL 114* 115*  --  117* 116*  CO2 24  --   --  19* 16*  GLUCOSE 118* 116*  --  169* 187*  BUN 40* 52*  --  36* 38*  CREATININE 1.26* 1.30*  --  1.30* 1.40*  CALCIUM 8.9  --   --  8.5* 8.7*  MG  --   --  1.7  --  1.6*   Liver Function Tests: Recent Labs  Lab 03/18/22 1422  AST 39  ALT 49*  ALKPHOS 88  BILITOT 0.3  PROT 7.2  ALBUMIN 3.2*   No results for input(s): "LIPASE", "AMYLASE" in the last 168 hours. No results for input(s): "AMMONIA" in the last 168 hours. Coagulation Profile: Recent Labs  Lab 03/18/22 1657  INR 1.0   CBC: Recent Labs  Lab 03/18/22 1422 03/18/22 1435 03/19/22 0439  WBC 3.5*  --  7.3  NEUTROABS 2.2  --   --   HGB 12.4 12.9 10.6*  HCT 38.9 38.0 32.9*  MCV 94.0  --  92.7  PLT 81*  --  84*   Cardiac Enzymes: No results for input(s): "CKTOTAL", "CKMB", "CKMBINDEX", "TROPONINI" in the last 168 hours. BNP: Invalid input(s): "POCBNP" CBG: Recent Labs  Lab 03/18/22 2027 03/19/22 0459 03/19/22 1023  GLUCAP 113* 170* 136*   HbA1C: No results for input(s): "HGBA1C" in the last 72 hours. Urine analysis:    Component Value Date/Time   COLORURINE YELLOW 03/18/2022 1430   APPEARANCEUR CLOUDY (A) 03/18/2022 1430   LABSPEC 1.012 03/18/2022 1430   PHURINE 8.0 03/18/2022 1430   GLUCOSEU NEGATIVE 03/18/2022 1430   HGBUR SMALL (A) 03/18/2022 1430   BILIRUBINUR NEGATIVE 03/18/2022 1430   BILIRUBINUR neg 04/24/2013 0933   KETONESUR NEGATIVE 03/18/2022 1430   PROTEINUR 100 (A) 03/18/2022 1430   UROBILINOGEN negative 04/24/2013 0933   NITRITE NEGATIVE 03/18/2022 1430   LEUKOCYTESUR MODERATE (A) 03/18/2022 1430    Sepsis Labs: '@LABRCNTIP'$ (procalcitonin:4,lacticidven:4) ) Recent Results (from the past 240 hour(s))  Blood Culture (routine x 2)     Status: None (Preliminary result)   Collection Time: 03/18/22  2:29 PM   Specimen: BLOOD  Result Value Ref Range Status   Specimen Description BLOOD LEFT ASSIST CONTROL  Final   Special Requests   Final    BOTTLES DRAWN AEROBIC AND ANAEROBIC Blood Culture adequate volume   Culture   Final    NO GROWTH < 24 HOURS Performed at [facility], [address] [place], [place] [address]    Report Status PENDING  Incomplete  Blood Culture (routine x 2)     Status: None (Preliminary result)   Collection Time: 03/18/22  2:30 PM   Specimen: BLOOD  Result Value Ref Range Status   Specimen Description BLOOD RIGHT ARM  Final   Special Requests   Final    BOTTLES DRAWN AEROBIC AND ANAEROBIC Blood Culture results may  not be optimal due to an inadequate volume of blood received in culture bottles   Culture   Final    NO GROWTH < 24 HOURS Performed at [facility], [address], [place], [place] [address]    Report Status PENDING  Incomplete  Resp panel by RT-PCR (RSV, Flu A&B, Covid) Anterior Nasal Swab     Status: None   Collection Time: 03/18/22  6:18 PM   Specimen: Anterior Nasal Swab  Result Value Ref Range Status   SARS Coronavirus 2 by RT PCR NEGATIVE NEGATIVE Final    Comment: (NOTE) SARS-CoV-2 target nucleic acids are NOT DETECTED.  The SARS-CoV-2 RNA is generally detectable in upper respiratory specimens during the acute phase of infection. The lowest concentration of SARS-CoV-2 viral copies this assay can detect is 138 copies/mL. A negative result does not preclude SARS-Cov-2 infection and should not be used as the sole basis for treatment or other patient management decisions. A negative result may occur with  improper specimen collection/handling, submission of specimen other than nasopharyngeal swab, presence of viral mutation(s) within  the areas targeted by this assay, and inadequate number of viral copies(<138 copies/mL). A negative result must be combined with clinical observations, patient history, and epidemiological information. The expected result is Negative.  Fact Sheet for Patients:  EntrepreneurPulse.com.au  Fact Sheet for Healthcare Providers:  IncredibleEmployment.be  This test is no t yet approved or cleared by the [place] FDA and  has been authorized for detection and/or diagnosis of SARS-CoV-2 by FDA under an Emergency Use Authorization (EUA). This EUA will remain  in effect (meaning this test can be used) for the duration of the COVID-19 declaration under Section 564(b)(1) of the Act, 21 U.S.C.section 360bbb-3(b)(1), unless the authorization is terminated  or revoked sooner.       Influenza A by PCR NEGATIVE NEGATIVE Final   Influenza B by PCR NEGATIVE NEGATIVE Final    Comment: (NOTE) The Xpert Xpress SARS-CoV-2/FLU/RSV plus assay is intended as an aid in the diagnosis of influenza from Nasopharyngeal swab specimens and should not be used as a sole basis for treatment. Nasal washings and aspirates are unacceptable for Xpert Xpress SARS-CoV-2/FLU/RSV testing.  Fact Sheet for Patients: EntrepreneurPulse.com.au  Fact Sheet for Healthcare Providers: IncredibleEmployment.be  This test is not yet approved or cleared by the [place] FDA and has been authorized for detection and/or diagnosis of SARS-CoV-2 by FDA under an Emergency Use Authorization (EUA). This EUA will remain in effect (meaning this test can be used) for the duration of the COVID-19 declaration under Section 564(b)(1) of the Act, 21 U.S.C. section 360bbb-3(b)(1), unless the authorization is terminated or revoked.     Resp Syncytial Virus by PCR NEGATIVE NEGATIVE Final    Comment: (NOTE) Fact Sheet for  Patients: EntrepreneurPulse.com.au  Fact Sheet for Healthcare Providers: IncredibleEmployment.be  This test is not yet approved or cleared by the [place] FDA and has been authorized for detection and/or diagnosis of SARS-CoV-2 by FDA under an Emergency Use Authorization (EUA). This EUA will remain in effect (meaning this test can be used) for the duration of the COVID-19 declaration under Section 564(b)(1) of the Act, 21 U.S.C. section 360bbb-3(b)(1), unless the authorization is terminated or revoked.  Performed at [facility], [address] [place], [place] [address]   MRSA Next Gen by PCR, Nasal     Status: Abnormal   Collection Time: 03/18/22  8:33 PM   Specimen: Nasal Mucosa; Nasal Swab  Result Value Ref Range Status  MRSA by PCR Next Gen DETECTED (A) NOT DETECTED Final    Comment: RESULT CALLED TO, READ BACK BY AND VERIFIED WITH: KING @ 0726 ON [id number] BY HENDERSON L (NOTE) The GeneXpert MRSA Assay (FDA approved for NASAL specimens only), is one component of a comprehensive MRSA colonization surveillance program. It is not intended to diagnose MRSA infection nor to guide or monitor treatment for MRSA infections. Test performance is not FDA approved in patients less than 68 years old. Performed at [facility], [address] [place], [place] [address]      Scheduled Meds:  Chlorhexidine Gluconate Cloth  6 each Topical Daily   hydrocortisone sod succinate (SOLU-CORTEF) inj  100 mg Intravenous Q8H   insulin aspart  0-9 Units Subcutaneous Q6H   levothyroxine  50 mcg Intravenous Daily   mupirocin ointment   Nasal BID   Continuous Infusions:  0.45 % NaCl with KCl 20 mEq / L 100 mL/hr at 03/19/22 0950   ceFEPime (MAXIPIME) IV     [START ON 03/20/2022] vancomycin      Procedures/Studies: CT Head Wo Contrast  Result Date: 03/18/2022 CLINICAL DATA:  Delirium EXAM: CT HEAD WITHOUT CONTRAST TECHNIQUE: Contiguous axial images were  obtained from the base of the skull through the vertex without intravenous contrast. RADIATION DOSE REDUCTION: This exam was performed according to the departmental dose-optimization program which includes automated exposure control, adjustment of the mA and/or kV according to patient size and/or use of iterative reconstruction technique. COMPARISON:  CT head 05/23/2010 report without imaging BRAIN: BRAIN Cerebral ventricle sizes are concordant with the degree of cerebral volume loss. Patchy and confluent areas of decreased attenuation are noted throughout the deep and periventricular white matter of the cerebral hemispheres bilaterally, compatible with chronic microvascular ischemic disease. No evidence of large-territorial acute infarction. No parenchymal hemorrhage. No mass lesion. No extra-axial collection. No mass effect or midline shift. No hydrocephalus. Basilar cisterns are patent. Vascular: No hyperdense vessel. Atherosclerotic calcifications are present within the cavernous internal carotid arteries. Skull: No acute fracture or focal lesion. Sinuses/Orbits: Left maxillary sinus mucosal thickening. Otherwise paranasal sinuses and mastoid air cells are clear. Bilateral lens replacement. Left scleral buckle. Otherwise the orbits are unremarkable. Other: None. IMPRESSION: No acute intracranial abnormality. Electronically Signed   By: Iven Finn M.D.   On: 03/18/2022 18:07   CT CHEST ABDOMEN PELVIS W CONTRAST  Result Date: 03/18/2022 CLINICAL DATA:  Sepsis EXAM: CT CHEST, ABDOMEN, AND PELVIS WITH CONTRAST TECHNIQUE: Multidetector CT imaging of the chest, abdomen and pelvis was performed following the standard protocol during bolus administration of intravenous contrast. RADIATION DOSE REDUCTION: This exam was performed according to the departmental dose-optimization program which includes automated exposure control, adjustment of the mA and/or kV according to patient size and/or use of iterative  reconstruction technique. CONTRAST:  55m OMNIPAQUE IOHEXOL 300 MG/ML  SOLN COMPARISON:  None Available. FINDINGS: CT CHEST FINDINGS Cardiovascular: Normal heart size. No significant pericardial effusion. The thoracic aorta is normal in caliber. Severe atherosclerotic plaque of the thoracic aorta. At least 2 vessel coronary artery calcifications. The main pulmonary artery is normal in caliber. No central pulmonary embolus. Mediastinum/Nodes: No enlarged mediastinal, hilar, or axillary lymph nodes. Heterogeneous partially calcified thyroid glands. Trachea and esophagus demonstrate no significant findings. Lungs/Pleura: Expiratory phase of respiration. Bilateral lower lobe atelectasis. No focal consolidation. Subpleural right upper lobe micronodule (4:62, 5:85). No pulmonary mass. No pleural effusion. No pneumothorax. Musculoskeletal: No chest wall abnormality. No suspicious lytic or blastic osseous lesions. No acute displaced  fracture. CT ABDOMEN PELVIS FINDINGS Hepatobiliary: No focal liver abnormality. Status post cholecystectomy. No intrahepatic biliary dilatation. Mild extrahepatic biliary ductal dilatation which can be seen in the post cholecystectomy setting. Pancreas: Diffusely atrophic. No focal lesion. Otherwise normal pancreatic contour. No surrounding inflammatory changes. No main pancreatic ductal dilatation. Spleen: Normal in size without focal abnormality. Adrenals/Urinary Tract: No adrenal nodule bilaterally. Bilateral kidneys enhance symmetrically. No hydronephrosis. No hydroureter. The urinary bladder is unremarkable. Foley catheter tip and balloon terminate within the urinary bladder lumen. On delayed imaging, there is no urothelial wall thickening and there are no filling defects in the opacified portions of the bilateral collecting systems or ureters. Stomach/Bowel: Stomach is within normal limits. No evidence of bowel wall thickening or dilatation. Appendix appears normal. Vascular/Lymphatic: No  abdominal aorta or iliac aneurysm. Severe atherosclerotic plaque of the aorta and its branches. No abdominal, pelvic, or inguinal lymphadenopathy. Reproductive: Pericentimeter intramural calcification likely representative of a degenerative uterine fibroid. Heterogeneous in thickened (12 mm). Endometrium. Otherwise uterus and bilateral adnexa are unremarkable. Other: No intraperitoneal free fluid. No intraperitoneal free gas. No organized fluid collection. Musculoskeletal: No abdominal wall hernia or abnormality. Trace subcutaneus soft tissue emphysema along the anterior lower abdominal wall suggestive of medication injections (2:80). No suspicious lytic or blastic osseous lesions. No acute displaced fracture. Grade 1 anterolisthesis of L4 on L5. Intervertebral disc space vacuum phenomenon at the L4-L5 and L5-S1 levels. Posterior disc osteophyte complex formation at the L5 on S1 level. IMPRESSION: 1. Heterogeneous and thickened endometrium. Recommend pelvic ultrasound for further evaluation. 2. Heterogeneous partially calcified thyroid glands. In the setting of significant comorbidities or limited life expectancy, no follow-up recommended (ref: J Am Coll Radiol. 2015 Feb;12(2): 143-50). 3. Subpleural right upper lobe micronodule. No follow-up needed if patient is low-risk.This recommendation follows the consensus statement: Guidelines for Management of Incidental Pulmonary Nodules Detected on CT Images: From the Fleischner Society 2017; Radiology 2017; 284:228-243. 4. Trace subcutaneus soft tissue emphysema along the anterior lower abdominal wall suggestive of medication injections. Electronically Signed   By: Iven Finn M.D.   On: 03/18/2022 18:03   DG Chest Port 1 View  Result Date: 03/18/2022 CLINICAL DATA:  Questionable sepsis in an 88 year old female. EXAM: PORTABLE CHEST 1 VIEW COMPARISON:  None available. FINDINGS: EKG leads project over the chest. Image rotated slightly to the RIGHT.  Cardiomediastinal contours and hilar structures unremarkable to the extent evaluated on this nonstandard AP projection accounting for rotation. No lobar level consolidation. Scattered airspace opacities throughout the chest mainly with linear appearance. No pneumothorax. On limited assessment there is no acute skeletal process. IMPRESSION: Scattered airspace opacities throughout the chest mainly with linear appearance. Findings favor scattered atelectasis. Difficult to exclude infection at the lung bases. Electronically Signed   By: Zetta Bills M.D.   On: 03/18/2022 15:22    Orson Eva, DO  [facility]  If 7PM-7AM, please contact night-coverage [email or web address] Password [email or web address] 03/19/2022, 10:24 AM   LOS: 1 day

## 2022-03-19 NOTE — Progress Notes (Signed)
SLP Cancellation Note  Patient Details Name: Jackie Hickman MRN: [id number] DOB: 1932/07/05   Cancelled treatment:       Reason Eval/Treat Not Completed: Fatigue/lethargy limiting ability to participate;Patient's level of consciousness. Pt is not alert or appropriate for PO trials at this time, ST will continue efforts and follow for goals of care. Thank you,  Sheina Mcleish H. Roddie Mc, CCC-SLP Speech Language Pathologist    Wende Bushy 03/19/2022, 3:38 PM

## 2022-03-19 NOTE — Progress Notes (Signed)
Pt transported to MRI at this time 

## 2022-03-19 NOTE — Procedures (Signed)
Routine EEG Report  Jackie Hickman is a 88 y.o. female with a history of altered mental status who is undergoing an EEG to evaluate for seizures.  Report: This EEG was acquired with electrodes placed according to the International 10-20 electrode system (including Fp1, Fp2, F3, F4, C3, C4, P3, P4, O1, O2, T3, T4, T5, T6, A1, A2, Fz, Cz, Pz). The following electrodes were missing or displaced: none.  The best background was up to 5-6 Hz with superimposed continuous poorly-formed generalized periodic discharges at 1.5-2 Hz. This activity is reactive to stimulation. Drowsiness was manifested by background fragmentation; deeper stages of sleep were not identified. There was no focal slowing. Other than the GPDs described there were no other interictal discharges. There were no electrographic seizures identified. Photic stimulation and hyperventilation were not performed.   Impression and clinical correlation: This EEG was obtained while awake and drowsy and is abnormal due to:  - moderate diffuse slowing indicative of global cerebral dysfunction - continuous poorly-formed generalized periodic discharges at 1.5-2 Hz. This pattern is on the ictal-interictal continuum with low-to-moderate risk of seizures depending on the etiology. This pattern may be seen in the setting of cefepime, which patient is prescribed. D/w Dr. Carles Collet hospitalist who will switch her to a suitable alternative antibiotic and order repeat rEEG in 2-3 days if her mental status does not improve.  Su Monks, MD [facility] [phone]  If 7pm- 7am, please page neurology on call as listed in [place].

## 2022-03-19 NOTE — TOC Initial Note (Signed)
Transition of Care [facility]) - Initial/Assessment Note    Patient Details  Name: Jackie Hickman MRN: [id number] Date of Birth: 08-27-1932  [facility] CM/SW Contact:    Shade Flood, LCSW Phone Number: 03/19/2022, 12:57 PM  Clinical Narrative:                  Pt admitted from [facility]. Workup is in progress. TOC will update Melissa at the NH and continue to follow for dc planning needs.  Expected Discharge Plan: Long Term Nursing Home Barriers to Discharge: Continued Medical Work up   Patient Goals and CMS Choice        Expected Discharge Plan and Services Expected Discharge Plan: [place] In-house Referral: Clinical Social Work     Living arrangements for the past 2 months: [place]                                      Prior Living Arrangements/Services Living arrangements for the past 2 months: [place] Lives with:: Facility Resident Patient language and need for interpreter reviewed:: Yes Do you feel safe going back to the place where you live?: Yes      Need for Family Participation in Patient Care: No (Comment) Care giver support system in place?: Yes (comment)   Criminal Activity/Legal Involvement Pertinent to Current Situation/Hospitalization: No - Comment as needed  Activities of Daily Living Home Assistive Devices/Equipment: Wheelchair ADL Screening (condition at time of admission) Is the patient deaf or have difficulty hearing?: No Does the patient have difficulty seeing, even when wearing glasses/contacts?: Yes Does the patient have difficulty concentrating, remembering, or making decisions?: Yes Patient able to express need for assistance with ADLs?: No Does the patient have difficulty dressing or bathing?: Yes Independently performs ADLs?: No Does the patient have difficulty walking or climbing stairs?: Yes Weakness of Legs: Both Weakness of Arms/Hands: Both  Permission  Sought/Granted                  Emotional Assessment       Orientation: : Oriented to Self Alcohol / Substance Use: Not Applicable Psych Involvement: No (comment)  Admission diagnosis:  Metabolic encephalopathy [Y86.57] 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] Acute cystitis without hematuria [N30.00] Hypothermia, initial encounter [T68.XXXA] Hypothyroidism, unspecified type [E03.9] Sepsis, due to unspecified organism, unspecified whether acute organ dysfunction present [facility]) [A41.9] Patient Active Problem List   Diagnosis Date Noted   Sepsis ([place]) 03/19/2022   Hypothermia 03/19/2022   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   UTI (urinary tract infection) 84/69/6295   Acute metabolic encephalopathy 28/41/3244   Dementia ([place]) 03/18/2022   Thrombocytopenia ([place]) 03/18/2022   Hypomagnesemia 02/24/2016   Normocytic anemia 02/23/2016   Hypokalemia 02/23/2016   Protein-calorie malnutrition ([place]) 02/23/2016   Acute kidney injury superimposed on chronic kidney disease ([place]) 02/23/2016   Alcohol abuse 02/23/2016   Incontinence 02/23/2016   BMI 26.0-26.9,adult 11/07/2014   Essential hypertension 04/24/2013   Type 2 diabetes mellitus with renal manifestations ([place]) 04/24/2013   Hyperlipidemia 04/24/2013   PCP:  System, Provider Not In Pharmacy:   [place], [place] [address] Phone: [phone] Fax: [place], [place] - [address] [place] [address] Phone: [phone] Fax: [phone]  Social Determinants of Health (SDOH) Interventions    Readmission Risk Interventions     No data to display           

## 2022-03-19 NOTE — Progress Notes (Signed)
Hospitalist made aware of current blood pressures.

## 2022-03-19 NOTE — Progress Notes (Signed)
Warming blanket turned back on at this time. Pt current temp 96.3

## 2022-03-19 NOTE — Progress Notes (Signed)
Date and time results received: 03/19/22 0730 (use smartphrase ".now" to insert current time)  Test: MRSA of Nares  Critical Value: Positive  Name of Provider Notified: Dr Tat  Orders Received? Or Actions Taken?: Actions Taken: Bactroban ointment BID ordered

## 2022-03-20 DIAGNOSIS — G9341 Metabolic encephalopathy: Secondary | ICD-10-CM | POA: Diagnosis not present

## 2022-03-20 DIAGNOSIS — T68XXXD Hypothermia, subsequent encounter: Secondary | ICD-10-CM | POA: Diagnosis not present

## 2022-03-20 DIAGNOSIS — R6520 Severe sepsis without septic shock: Secondary | ICD-10-CM

## 2022-03-20 DIAGNOSIS — A419 Sepsis, unspecified organism: Secondary | ICD-10-CM | POA: Diagnosis not present

## 2022-03-20 DIAGNOSIS — E035 Myxedema coma: Secondary | ICD-10-CM | POA: Diagnosis not present

## 2022-03-20 LAB — CBC
HCT: 30.5 % — ABNORMAL LOW (ref 36.0–46.0)
Hemoglobin: 10.2 g/dL — ABNORMAL LOW (ref 12.0–15.0)
MCH: 30.4 pg (ref 26.0–34.0)
MCHC: 33.4 g/dL (ref 30.0–36.0)
MCV: 91 fL (ref 80.0–100.0)
Platelets: 62 10*3/uL — ABNORMAL LOW (ref 150–400)
RBC: 3.35 MIL/uL — ABNORMAL LOW (ref 3.87–5.11)
RDW: 17.1 % — ABNORMAL HIGH (ref 11.5–15.5)
WBC: 10.9 10*3/uL — ABNORMAL HIGH (ref 4.0–10.5)
nRBC: 2 % — ABNORMAL HIGH (ref 0.0–0.2)

## 2022-03-20 LAB — COMPREHENSIVE METABOLIC PANEL
ALT: 35 U/L (ref 0–44)
AST: 30 U/L (ref 15–41)
Albumin: 2.7 g/dL — ABNORMAL LOW (ref 3.5–5.0)
Alkaline Phosphatase: 71 U/L (ref 38–126)
Anion gap: 9 (ref 5–15)
BUN: 44 mg/dL — ABNORMAL HIGH (ref 8–23)
CO2: 19 mmol/L — ABNORMAL LOW (ref 22–32)
Calcium: 8.6 mg/dL — ABNORMAL LOW (ref 8.9–10.3)
Chloride: 118 mmol/L — ABNORMAL HIGH (ref 98–111)
Creatinine, Ser: 2.12 mg/dL — ABNORMAL HIGH (ref 0.44–1.00)
GFR, Estimated: 22 mL/min — ABNORMAL LOW (ref >60)
Glucose, Bld: 113 mg/dL — ABNORMAL HIGH (ref 70–99)
Potassium: 4.7 mmol/L (ref 3.5–5.1)
Sodium: 146 mmol/L — ABNORMAL HIGH (ref 135–145)
Total Bilirubin: 0.6 mg/dL (ref 0.3–1.2)
Total Protein: 6.2 g/dL — ABNORMAL LOW (ref 6.5–8.1)

## 2022-03-20 LAB — GLUCOSE, CAPILLARY
Glucose-Capillary: 120 mg/dL — ABNORMAL HIGH (ref 70–99)
Glucose-Capillary: 143 mg/dL — ABNORMAL HIGH (ref 70–99)
Glucose-Capillary: 141 mg/dL — ABNORMAL HIGH (ref 70–99)

## 2022-03-20 LAB — URINE CULTURE: Culture: 20000 — AB

## 2022-03-20 LAB — TSH: TSH: 9.64 u[IU]/mL — ABNORMAL HIGH (ref 0.350–4.500)

## 2022-03-20 LAB — T3: T3, Total: 59 ng/dL — ABNORMAL LOW (ref 71–180)

## 2022-03-20 LAB — HEMOGLOBIN A1C
Hgb A1c MFr Bld: 6.7 % — ABNORMAL HIGH (ref 4.8–5.6)
Mean Plasma Glucose: 146 mg/dL

## 2022-03-20 LAB — MAGNESIUM: Magnesium: 2.1 mg/dL (ref 1.7–2.4)

## 2022-03-20 MED ORDER — PIPERACILLIN-TAZOBACTAM 3.375 G IVPB
3.3750 g | Freq: Two times a day (BID) | INTRAVENOUS | Status: DC
  Administered 2022-03-20 – 2022-03-21 (×2): 3.375 g via INTRAVENOUS
  Filled 2022-03-20 (×2): qty 50

## 2022-03-20 MED ORDER — HYDROCORTISONE SOD SUC (PF) 100 MG IJ SOLR
50.0000 mg | Freq: Three times a day (TID) | INTRAMUSCULAR | Status: DC
  Administered 2022-03-20 – 2022-03-21 (×3): 50 mg via INTRAVENOUS
  Filled 2022-03-20 (×3): qty 2

## 2022-03-20 MED ORDER — LEVETIRACETAM IN NACL 1000 MG/100ML IV SOLN
1000.0000 mg | Freq: Once | INTRAVENOUS | Status: AC
  Administered 2022-03-20: 1000 mg via INTRAVENOUS
  Filled 2022-03-20: qty 100

## 2022-03-20 MED ORDER — LEVETIRACETAM IN NACL 500 MG/100ML IV SOLN
500.0000 mg | Freq: Two times a day (BID) | INTRAVENOUS | Status: DC
  Administered 2022-03-20 – 2022-03-21 (×2): 500 mg via INTRAVENOUS
  Filled 2022-03-20 (×2): qty 100

## 2022-03-20 MED ORDER — LORAZEPAM 2 MG/ML IJ SOLN
1.0000 mg | Freq: Once | INTRAMUSCULAR | Status: AC
  Administered 2022-03-20: 1 mg via INTRAVENOUS
  Filled 2022-03-20: qty 1

## 2022-03-20 MED ORDER — VANCOMYCIN HCL 750 MG/150ML IV SOLN
750.0000 mg | INTRAVENOUS | Status: DC
  Filled 2022-03-20: qty 150

## 2022-03-20 NOTE — Progress Notes (Signed)
PROGRESS NOTE  WYNDI NORTHRUP RSW:[id number] DOB: June 14, 1932 DOA: 03/18/2022 PCP: System, Provider Not In  Brief History:   88 y.o. female with medical history significant for diabetes mellitus, hypertension alcohol history. Comes from [facility] with reports of altered mental status and hypothermia.  Per nursing home, patient was diagnosed with UTI 3 days ago and was placed on Macrobid.  At the time of my evaluation, patient is somnolent and not answering questions.   The patient's daughter [name] last saw her mom last 1 week ago and then today.  She believes alteration in mental status started at least 4 days ago when she was diagnosed with UTI and started on Macrobid.  At baseline patient has dementia, good and bad days, sometimes she recognizes family, sometimes she does not.  She ambulates with a wheelchair.  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   ED does not Course: Temperature 98.6.  Heart rate 43 improved to 60s as patient warmed up, respiratory rate 11-23.  Blood pressure systolic 093G to 182X.  Lactic acid 2.1 > 1.9..  Sodium 145.  WBC 3.5.  UA with moderate leukocytes few bacteria.  Chest x-ray showing atelectasis, CTA chest abdomen and pelvis with contrast without significant findings except for heterogeneous and thickened endometrium. TSH -markedly elevated at 30.15. -Broad-spectrum antibiotics IV vancomycin and cefepime started.  1 L bolus LR given. ,  With maintenance at 125.   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 -Continue empiric vancomycin and cefepime pending culture data -Lactic 2.1>>1.9   UTI -UA >50 WBC -Continue vancomycin and cefepime pending culture data   Acute  metabolic encephalopathy -Continues to be obtunded -Multifactorial including sepsis and possible myxedema coma -repeat TSH 30.150>>9.640 -Continue antibiotics -ABG--7.4 5/27/100/18 on room air -B12--689 -Check HBZJIRC--78 -Folic LFYB--0.1 -MRI brain--no acute findings -CT head negative for acute findings -started thiamine given Etoh hx -EEG--continuous poorly-formed generalized periodic discharges at 1.5-2 Hz. This pattern is on the ictal-interictal continuum with low-to-moderate risk of seizures>>may be seen in the setting of cefepime  -discontinued cefepime -as mental status not improved>>give ativan x 1 and load Keppra   Hypothyroidism/myxedema -Continue IV Synthroid -She was given Synthroid IV 200 mcg x 1 -Repeat thyroid function studies 03/20/2022 -Continue Solu-Cortef>>wean -Follow-up cortisol>>66.2 -repeat TSH 30.150>>9.640   CKD stage IIIb -Baseline creatinine 1.2-1.4 -Monitor serial BMP   Diabetes mellitus type 2, controlled -NovoLog sliding scale -Hemoglobin B5Z--0.2 -Was on Trulicity outpatient   Thrombocytopenia -Secondary to infectious process -H85--277 -Folic OEUM--3.5   Essential hypertension -Holding Bystolic secondary to soft blood pressure initially   Major neurocognitive disorder -Baseline as discussed above in the history   GOC -updated family>>confirms DNR -continue care for now with understanding of transition to comfort measures if no improvement clinically             Family Communication:   daughter updated 12/16   Consultants:  none   Code Status:  DNR   DVT Prophylaxis:  SCDs     Procedures: As Listed in Progress Note Above   Antibiotics: Vanc 12/14>> Cefepime 12/14>12//15 Zosyn 12/15>>      Subjective: ROS not possible.  Pt obtunded  Objective: Vitals:   03/20/22 0700 03/20/22 0751 03/20/22 0800 03/20/22 0900  BP: (!) 159/61  [name])  162/78 (!) 162/70  Pulse: 74 71 75 71  Resp: '19 20 17 20  '$ Temp:  98.3 F (36.8  C)    TempSrc:  Axillary    SpO2: 99% 99% 98% 97%  Weight:      Height:        Intake/Output Summary (Last 24 hours) at 03/20/2022 0930 Last data filed at 03/20/2022 9476 Gross per 24 hour  Intake 1654.23 ml  Output 275 ml  Net 1379.23 ml   Weight change: -11.1 kg Exam:  General:  Pt is obtunded, does not follow commands appropriately, not in acute distress HEENT: No icterus, No thrush, No neck mass, [place]/AT Cardiovascular: RRR, S1/S2, no rubs, no gallops Respiratory: bilateral rales Abdomen: Soft/+BS, non tender, non distended, no guarding Extremities: trace LE edema, No lymphangitis, No petechiae, No rashes, no synovitis   Data Reviewed: I have personally reviewed following labs and imaging studies Basic Metabolic Panel: Recent Labs  Lab 03/18/22 1422 03/18/22 1435 03/18/22 1450 03/18/22 2318 03/19/22 0439 03/20/22 0349  NA 145 147*  --  144 146* 146*  K 3.5 4.9  --  3.6 3.4* 4.7  CL 114* 115*  --  117* 116* 118*  CO2 24  --   --  19* 16* 19*  GLUCOSE 118* 116*  --  169* 187* 113*  BUN 40* 52*  --  36* 38* 44*  CREATININE 1.26* 1.30*  --  1.30* 1.40* 2.12*  CALCIUM 8.9  --   --  8.5* 8.7* 8.6*  MG  --   --  1.7  --  1.6* 2.1   Liver Function Tests: Recent Labs  Lab 03/18/22 1422 03/20/22 0349  AST 39 30  ALT 49* 35  ALKPHOS 88 71  BILITOT 0.3 0.6  PROT 7.2 6.2*  ALBUMIN 3.2* 2.7*   No results for input(s): "LIPASE", "AMYLASE" in the last 168 hours. Recent Labs  Lab 03/19/22 1106  AMMONIA 23   Coagulation Profile: Recent Labs  Lab 03/18/22 1657  INR 1.0   CBC: Recent Labs  Lab 03/18/22 1422 03/18/22 1435 03/19/22 0439 03/20/22 0349  WBC 3.5*  --  7.3 10.9*  NEUTROABS 2.2  --   --   --   HGB 12.4 12.9 10.6* 10.2*  HCT 38.9 38.0 32.9* 30.5*  MCV 94.0  --  92.7 91.0  PLT 81*  --  84* 62*   Cardiac Enzymes: Recent Labs  Lab 03/19/22 0917  CKTOTAL 15*   BNP: Invalid input(s): "POCBNP" CBG: Recent Labs  Lab 03/19/22 0459  03/19/22 1023 03/19/22 1655 03/19/22 2353 03/20/22 0504  GLUCAP 170* 136* 118* 102* 120*   HbA1C: Recent Labs    03/19/22 0439  HGBA1C 6.7*   Urine analysis:    Component Value Date/Time   COLORURINE YELLOW 03/18/2022 1430   APPEARANCEUR CLOUDY (A) 03/18/2022 1430   LABSPEC 1.012 03/18/2022 1430   PHURINE 8.0 03/18/2022 1430   GLUCOSEU NEGATIVE 03/18/2022 1430   HGBUR SMALL (A) 03/18/2022 1430   BILIRUBINUR NEGATIVE 03/18/2022 1430   BILIRUBINUR neg 04/24/2013 0933   KETONESUR NEGATIVE 03/18/2022 1430   PROTEINUR 100 (A) 03/18/2022 1430   UROBILINOGEN negative 04/24/2013 0933   NITRITE NEGATIVE 03/18/2022 1430   LEUKOCYTESUR MODERATE (A) 03/18/2022 1430   Sepsis Labs: '@LABRCNTIP'$ (procalcitonin:4,lacticidven:4) ) Recent Results (from the past 240 hour(s))  Blood Culture (routine x 2)     Status: None (Preliminary result)   Collection Time: 03/18/22  2:29 PM   Specimen: BLOOD  Result Value Ref Range Status  Specimen Description BLOOD LEFT ASSIST CONTROL  Final   Special Requests   Final    BOTTLES DRAWN AEROBIC AND ANAEROBIC Blood Culture adequate volume   Culture   Final    NO GROWTH 2 DAYS Performed at [facility], [address] [place], [place] [address]    Report Status PENDING  Incomplete  Blood Culture (routine x 2)     Status: None (Preliminary result)   Collection Time: 03/18/22  2:30 PM   Specimen: BLOOD  Result Value Ref Range Status   Specimen Description BLOOD RIGHT ARM  Final   Special Requests   Final    BOTTLES DRAWN AEROBIC AND ANAEROBIC Blood Culture results may not be optimal due to an inadequate volume of blood received in culture bottles   Culture   Final    NO GROWTH 2 DAYS Performed at [facility], [address], [place], [place] [address]    Report Status PENDING  Incomplete  Urine Culture     Status: Abnormal (Preliminary result)   Collection Time: 03/18/22  2:30 PM   Specimen: Urine, Catheterized  Result Value Ref Range Status    Specimen Description   Final    URINE, CATHETERIZED Performed at [facility], [address] [place], [place] [address]    Special Requests   Final    NONE Performed at [facility], [address], [place], [place] [address]    Culture (A)  Final    20,000 COLONIES/mL GRAM POSITIVE COCCI IDENTIFICATION AND SUSCEPTIBILITIES TO FOLLOW Performed at [facility], [place] [address], [place], [place] [address]    Report Status PENDING  Incomplete  Resp panel by RT-PCR (RSV, Flu A&B, Covid) Anterior Nasal Swab     Status: None   Collection Time: 03/18/22  6:18 PM   Specimen: Anterior Nasal Swab  Result Value Ref Range Status   SARS Coronavirus 2 by RT PCR NEGATIVE NEGATIVE Final    Comment: (NOTE) SARS-CoV-2 target nucleic acids are NOT DETECTED.  The SARS-CoV-2 RNA is generally detectable in upper respiratory specimens during the acute phase of infection. The lowest concentration of SARS-CoV-2 viral copies this assay can detect is 138 copies/mL. A negative result does not preclude SARS-Cov-2 infection and should not be used as the sole basis for treatment or other patient management decisions. A negative result may occur with  improper specimen collection/handling, submission of specimen other than nasopharyngeal swab, presence of viral mutation(s) within the areas targeted by this assay, and inadequate number of viral copies(<138 copies/mL). A negative result must be combined with clinical observations, patient history, and epidemiological information. The expected result is Negative.  Fact Sheet for Patients:  EntrepreneurPulse.com.au  Fact Sheet for Healthcare Providers:  IncredibleEmployment.be  This test is no t yet approved or cleared by the [place] FDA and  has been authorized for detection and/or diagnosis of SARS-CoV-2 by FDA under an Emergency Use Authorization (EUA). This EUA will remain  in effect (meaning this test can be  used) for the duration of the COVID-19 declaration under Section 564(b)(1) of the Act, 21 U.S.C.section 360bbb-3(b)(1), unless the authorization is terminated  or revoked sooner.       Influenza A by PCR NEGATIVE NEGATIVE Final   Influenza B by PCR NEGATIVE NEGATIVE Final    Comment: (NOTE) The Xpert Xpress SARS-CoV-2/FLU/RSV plus assay is intended as an aid in the diagnosis of influenza from Nasopharyngeal swab specimens and should not be used as a sole basis for treatment. Nasal  washings and aspirates are unacceptable for Xpert Xpress SARS-CoV-2/FLU/RSV testing.  Fact Sheet for Patients: EntrepreneurPulse.com.au  Fact Sheet for Healthcare Providers: IncredibleEmployment.be  This test is not yet approved or cleared by the [place] FDA and has been authorized for detection and/or diagnosis of SARS-CoV-2 by FDA under an Emergency Use Authorization (EUA). This EUA will remain in effect (meaning this test can be used) for the duration of the COVID-19 declaration under Section 564(b)(1) of the Act, 21 U.S.C. section 360bbb-3(b)(1), unless the authorization is terminated or revoked.     Resp Syncytial Virus by PCR NEGATIVE NEGATIVE Final    Comment: (NOTE) Fact Sheet for Patients: EntrepreneurPulse.com.au  Fact Sheet for Healthcare Providers: IncredibleEmployment.be  This test is not yet approved or cleared by the [place] FDA and has been authorized for detection and/or diagnosis of SARS-CoV-2 by FDA under an Emergency Use Authorization (EUA). This EUA will remain in effect (meaning this test can be used) for the duration of the COVID-19 declaration under Section 564(b)(1) of the Act, 21 U.S.C. section 360bbb-3(b)(1), unless the authorization is terminated or revoked.  Performed at [facility], [address], [place], [place] [address]   MRSA Next Gen by PCR, Nasal     Status: Abnormal    Collection Time: 03/18/22  8:33 PM   Specimen: Nasal Mucosa; Nasal Swab  Result Value Ref Range Status   MRSA by PCR Next Gen DETECTED (A) NOT DETECTED Final    Comment: RESULT CALLED TO, READ BACK BY AND VERIFIED WITH: KING @ 0726 ON [id number] BY HENDERSON L (NOTE) The GeneXpert MRSA Assay (FDA approved for NASAL specimens only), is one component of a comprehensive MRSA colonization surveillance program. It is not intended to diagnose MRSA infection nor to guide or monitor treatment for MRSA infections. Test performance is not FDA approved in patients less than 89 years old. Performed at [facility], [address], [place], [place] [address]      Scheduled Meds:  Chlorhexidine Gluconate Cloth  6 each Topical Daily   hydrocortisone sod succinate (SOLU-CORTEF) inj  100 mg Intravenous Q8H   insulin aspart  0-9 Units Subcutaneous Q6H   levothyroxine  50 mcg Intravenous Daily   LORazepam  1 mg Intravenous Once   mupirocin ointment   Nasal BID   Continuous Infusions:  0.45 % NaCl with KCl 20 mEq / L Stopped (03/20/22 0539)   levETIRAcetam     levETIRAcetam     piperacillin-tazobactam (ZOSYN)  IV 3.375 g (03/20/22 0620)   thiamine (VITAMIN B1) injection Stopped (03/20/22 0620)   vancomycin      Procedures/Studies: MR BRAIN WO CONTRAST  Result Date: 03/19/2022 CLINICAL DATA:  Mental status change, persistent or worsening. Right gaze preference EXAM: MRI HEAD WITHOUT CONTRAST TECHNIQUE: Multiplanar, multiecho pulse sequences of the brain and surrounding structures were obtained without intravenous contrast. COMPARISON:  Head CT from yesterday FINDINGS: Brain: No acute infarction, hemorrhage, hydrocephalus, extra-axial collection or mass lesion. Generalized brain atrophy, particularly pronounced in the medial temporal lobes where there is marked sulcal widening and some FLAIR hyperintensity unusual streaky T2 hyperintensity in the ventral medulla, without swelling or clear volume loss,  chronic appearing. Finally, there is atypical T2 hyperintensity peripheral to the bilateral putamen. Mild chronic small vessel ischemic type change in the periventricular white matter. Vascular: Major flow voids are preserved Skull and upper cervical spine: No focal marrow lesion Sinuses/Orbits: Bilateral cataract resection IMPRESSION: No acute finding including infarct. Unusual pattern of signal abnormality in the medulla, putamen  region, and medial temporal lobes with a background of generalized brain atrophy, question multi system atrophy. Electronically Signed   By: Jorje Guild M.D.   On: 03/19/2022 13:04   CT Head Wo Contrast  Result Date: 03/18/2022 CLINICAL DATA:  Delirium EXAM: CT HEAD WITHOUT CONTRAST TECHNIQUE: Contiguous axial images were obtained from the base of the skull through the vertex without intravenous contrast. RADIATION DOSE REDUCTION: This exam was performed according to the departmental dose-optimization program which includes automated exposure control, adjustment of the mA and/or kV according to patient size and/or use of iterative reconstruction technique. COMPARISON:  CT head 05/23/2010 report without imaging BRAIN: BRAIN Cerebral ventricle sizes are concordant with the degree of cerebral volume loss. Patchy and confluent areas of decreased attenuation are noted throughout the deep and periventricular white matter of the cerebral hemispheres bilaterally, compatible with chronic microvascular ischemic disease. No evidence of large-territorial acute infarction. No parenchymal hemorrhage. No mass lesion. No extra-axial collection. No mass effect or midline shift. No hydrocephalus. Basilar cisterns are patent. Vascular: No hyperdense vessel. Atherosclerotic calcifications are present within the cavernous internal carotid arteries. Skull: No acute fracture or focal lesion. Sinuses/Orbits: Left maxillary sinus mucosal thickening. Otherwise paranasal sinuses and mastoid air cells are  clear. Bilateral lens replacement. Left scleral buckle. Otherwise the orbits are unremarkable. Other: None. IMPRESSION: No acute intracranial abnormality. Electronically Signed   By: Iven Finn M.D.   On: 03/18/2022 18:07   CT CHEST ABDOMEN PELVIS W CONTRAST  Result Date: 03/18/2022 CLINICAL DATA:  Sepsis EXAM: CT CHEST, ABDOMEN, AND PELVIS WITH CONTRAST TECHNIQUE: Multidetector CT imaging of the chest, abdomen and pelvis was performed following the standard protocol during bolus administration of intravenous contrast. RADIATION DOSE REDUCTION: This exam was performed according to the departmental dose-optimization program which includes automated exposure control, adjustment of the mA and/or kV according to patient size and/or use of iterative reconstruction technique. CONTRAST:  33m OMNIPAQUE IOHEXOL 300 MG/ML  SOLN COMPARISON:  None Available. FINDINGS: CT CHEST FINDINGS Cardiovascular: Normal heart size. No significant pericardial effusion. The thoracic aorta is normal in caliber. Severe atherosclerotic plaque of the thoracic aorta. At least 2 vessel coronary artery calcifications. The main pulmonary artery is normal in caliber. No central pulmonary embolus. Mediastinum/Nodes: No enlarged mediastinal, hilar, or axillary lymph nodes. Heterogeneous partially calcified thyroid glands. Trachea and esophagus demonstrate no significant findings. Lungs/Pleura: Expiratory phase of respiration. Bilateral lower lobe atelectasis. No focal consolidation. Subpleural right upper lobe micronodule (4:62, 5:85). No pulmonary mass. No pleural effusion. No pneumothorax. Musculoskeletal: No chest wall abnormality. No suspicious lytic or blastic osseous lesions. No acute displaced fracture. CT ABDOMEN PELVIS FINDINGS Hepatobiliary: No focal liver abnormality. Status post cholecystectomy. No intrahepatic biliary dilatation. Mild extrahepatic biliary ductal dilatation which can be seen in the post cholecystectomy setting.  Pancreas: Diffusely atrophic. No focal lesion. Otherwise normal pancreatic contour. No surrounding inflammatory changes. No main pancreatic ductal dilatation. Spleen: Normal in size without focal abnormality. Adrenals/Urinary Tract: No adrenal nodule bilaterally. Bilateral kidneys enhance symmetrically. No hydronephrosis. No hydroureter. The urinary bladder is unremarkable. Foley catheter tip and balloon terminate within the urinary bladder lumen. On delayed imaging, there is no urothelial wall thickening and there are no filling defects in the opacified portions of the bilateral collecting systems or ureters. Stomach/Bowel: Stomach is within normal limits. No evidence of bowel wall thickening or dilatation. Appendix appears normal. Vascular/Lymphatic: No abdominal aorta or iliac aneurysm. Severe atherosclerotic plaque of the aorta and its branches. No abdominal, pelvic, or inguinal lymphadenopathy.  Reproductive: Pericentimeter intramural calcification likely representative of a degenerative uterine fibroid. Heterogeneous in thickened (12 mm). Endometrium. Otherwise uterus and bilateral adnexa are unremarkable. Other: No intraperitoneal free fluid. No intraperitoneal free gas. No organized fluid collection. Musculoskeletal: No abdominal wall hernia or abnormality. Trace subcutaneus soft tissue emphysema along the anterior lower abdominal wall suggestive of medication injections (2:80). No suspicious lytic or blastic osseous lesions. No acute displaced fracture. Grade 1 anterolisthesis of L4 on L5. Intervertebral disc space vacuum phenomenon at the L4-L5 and L5-S1 levels. Posterior disc osteophyte complex formation at the L5 on S1 level. IMPRESSION: 1. Heterogeneous and thickened endometrium. Recommend pelvic ultrasound for further evaluation. 2. Heterogeneous partially calcified thyroid glands. In the setting of significant comorbidities or limited life expectancy, no follow-up recommended (ref: J Am Coll Radiol.  2015 Feb;12(2): 143-50). 3. Subpleural right upper lobe micronodule. No follow-up needed if patient is low-risk.This recommendation follows the consensus statement: Guidelines for Management of Incidental Pulmonary Nodules Detected on CT Images: From the Fleischner Society 2017; Radiology 2017; 284:228-243. 4. Trace subcutaneus soft tissue emphysema along the anterior lower abdominal wall suggestive of medication injections. Electronically Signed   By: Iven Finn M.D.   On: 03/18/2022 18:03   DG Chest Port 1 View  Result Date: 03/18/2022 CLINICAL DATA:  Questionable sepsis in an 88 year old female. EXAM: PORTABLE CHEST 1 VIEW COMPARISON:  None available. FINDINGS: EKG leads project over the chest. Image rotated slightly to the RIGHT. Cardiomediastinal contours and hilar structures unremarkable to the extent evaluated on this nonstandard AP projection accounting for rotation. No lobar level consolidation. Scattered airspace opacities throughout the chest mainly with linear appearance. No pneumothorax. On limited assessment there is no acute skeletal process. IMPRESSION: Scattered airspace opacities throughout the chest mainly with linear appearance. Findings favor scattered atelectasis. Difficult to exclude infection at the lung bases. Electronically Signed   By: Zetta Bills M.D.   On: 03/18/2022 15:22    Orson Eva, DO  [facility]  If 7PM-7AM, please contact night-coverage [email or web address] Password [email or web address] 03/20/2022, 9:30 AM   LOS: 2 days

## 2022-03-21 DIAGNOSIS — E035 Myxedema coma: Secondary | ICD-10-CM | POA: Diagnosis not present

## 2022-03-21 DIAGNOSIS — G9341 Metabolic encephalopathy: Secondary | ICD-10-CM | POA: Diagnosis not present

## 2022-03-21 DIAGNOSIS — A419 Sepsis, unspecified organism: Secondary | ICD-10-CM | POA: Diagnosis not present

## 2022-03-21 DIAGNOSIS — R6520 Severe sepsis without septic shock: Secondary | ICD-10-CM | POA: Diagnosis not present

## 2022-03-21 LAB — CBC
HCT: 27.6 % — ABNORMAL LOW (ref 36.0–46.0)
Hemoglobin: 9.3 g/dL — ABNORMAL LOW (ref 12.0–15.0)
MCH: 30.4 pg (ref 26.0–34.0)
MCHC: 33.7 g/dL (ref 30.0–36.0)
MCV: 90.2 fL (ref 80.0–100.0)
Platelets: 55 10*3/uL — ABNORMAL LOW (ref 150–400)
RBC: 3.06 MIL/uL — ABNORMAL LOW (ref 3.87–5.11)
RDW: 16.9 % — ABNORMAL HIGH (ref 11.5–15.5)
WBC: 9.9 10*3/uL (ref 4.0–10.5)
nRBC: 1.8 % — ABNORMAL HIGH (ref 0.0–0.2)

## 2022-03-21 LAB — GLUCOSE, CAPILLARY
Glucose-Capillary: 109 mg/dL — ABNORMAL HIGH (ref 70–99)
Glucose-Capillary: 121 mg/dL — ABNORMAL HIGH (ref 70–99)
Glucose-Capillary: 148 mg/dL — ABNORMAL HIGH (ref 70–99)

## 2022-03-21 LAB — MAGNESIUM: Magnesium: 2.3 mg/dL (ref 1.7–2.4)

## 2022-03-21 LAB — COMPREHENSIVE METABOLIC PANEL
ALT: 37 U/L (ref 0–44)
AST: 32 U/L (ref 15–41)
Albumin: 2.5 g/dL — ABNORMAL LOW (ref 3.5–5.0)
Alkaline Phosphatase: 81 U/L (ref 38–126)
Anion gap: 10 (ref 5–15)
BUN: 51 mg/dL — ABNORMAL HIGH (ref 8–23)
CO2: 15 mmol/L — ABNORMAL LOW (ref 22–32)
Calcium: 8.6 mg/dL — ABNORMAL LOW (ref 8.9–10.3)
Chloride: 119 mmol/L — ABNORMAL HIGH (ref 98–111)
Creatinine, Ser: 2.53 mg/dL — ABNORMAL HIGH (ref 0.44–1.00)
GFR, Estimated: 18 mL/min — ABNORMAL LOW (ref >60)
Glucose, Bld: 141 mg/dL — ABNORMAL HIGH (ref 70–99)
Potassium: 4.5 mmol/L (ref 3.5–5.1)
Sodium: 144 mmol/L (ref 135–145)
Total Bilirubin: 0.6 mg/dL (ref 0.3–1.2)
Total Protein: 5.9 g/dL — ABNORMAL LOW (ref 6.5–8.1)

## 2022-03-21 LAB — CK: Total CK: 58 U/L (ref 38–234)

## 2022-03-21 MED ORDER — LORAZEPAM 2 MG/ML IJ SOLN: 1.0000 mg | INTRAMUSCULAR | Status: DC | PRN

## 2022-03-21 MED ORDER — GLYCOPYRROLATE 0.2 MG/ML IJ SOLN: 0.1000 mg | INTRAMUSCULAR | Status: DC | PRN

## 2022-03-21 MED ORDER — MORPHINE SULFATE (PF) 2 MG/ML IV SOLN: 2.0000 mg | INTRAVENOUS | Status: DC | PRN

## 2022-03-21 NOTE — Progress Notes (Signed)
PROGRESS NOTE  Jackie Hickman BWG:[id number] DOB: 12/02/1932 DOA: 03/18/2022 PCP: System, Provider Not In Brief History:   88 y.o. female with medical history significant for diabetes mellitus, hypertension alcohol history. Comes from [facility] with reports of altered mental status and hypothermia.  Per nursing home, patient was diagnosed with UTI 3 days ago and was placed on Macrobid.  At the time of my evaluation, patient is somnolent and not answering questions.   The patient's daughter [name] last saw her mom last 1 week ago and then today.  She believes alteration in mental status started at least 4 days ago when she was diagnosed with UTI and started on Macrobid.  At baseline patient has dementia, good and bad days, sometimes she recognizes family, sometimes she does not.  She ambulates with a wheelchair.  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   ED does not Course: Temperature 98.6.  Heart rate 43 improved to 60s as patient warmed up, respiratory rate 11-23.  Blood pressure systolic 177L to 390Z.  Lactic acid 2.1 > 1.9..  Sodium 145.  WBC 3.5.  UA with moderate leukocytes few bacteria.  Chest x-ray showing atelectasis, CTA chest abdomen and pelvis with contrast without significant findings except for heterogeneous and thickened endometrium. TSH -markedly elevated at 30.15. -Broad-spectrum antibiotics IV vancomycin and cefepime started.  1 L bolus LR given. ,  With maintenance at 125.   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 Pt was continued on IV abx and IV fluids.  IV synthroid and hydrocortisone was continued.  There was clinical concern for seizure.  Pt was given IV ativan and keppra without much improvement in mental status. Unfortunately, the patient did not improve clinically.  [place] discussions were held each day with family at bedside.  They did not want patient transferred to [facility] or other tertiary care center.   We discussed the patient's overall poor  prognosis given her advanced age, frailty, poor baseline function in the setting of her comorbidities and severity of her current illness.  We discussed that patient will unlikely return to her baseline premorbid function poor as that may have been.  Family expressed that patient would not have wanted to even have this type of aggressive care presently.  As patient did not improve despite optimal treatment, family expressed desire to transition patient's focus of care to focus on comfort measures.   Assessment/Plan: Severe Sepsis -present on admission -presented with hypothermia, tachypnea and leukopenia -due to UTI -CT CAP--bibasilar atelectasis; RUL micronodule; heterogenous and thickened endometrium 12 mm, pericardiac atrophy, status post cholecystectomy. -Continue empiric vancomycin and cefepime pending culture data -Lactic 2.1>>1.9 -now transitioned to focus on comfort measures   UTI -UA >50 WBC -Continue vancomycin and cefepime pending culture data -now transitioned to focus on comfort measures   Acute metabolic encephalopathy -Continues to be obtunded -Multifactorial including sepsis and possible myxedema coma -repeat TSH 30.150>>9.640 -Continue antibiotics -ABG--7.4 5/27/100/18 on room air -B12--689 -Check ESPQZRA--07 -Folic MAUQ--3.3 -MRI brain--no acute findings -CT head negative for acute findings -started thiamine given Etoh hx -EEG--continuous poorly-formed generalized periodic discharges at 1.5-2 Hz. This pattern is on the ictal-interictal continuum with low-to-moderate risk of seizures>>may be seen in the setting of cefepime  -discontinued cefepime -as mental status not improved>>give ativan x 1 and load Keppra>>no improvement -family did not want transfer to tertiary care center or further aggressive care -now transitioned to focus on comfort measures  Hypothyroidism/myxedema -Continue IV Synthroid -She was given Synthroid IV 200 mcg x 1 -Repeat thyroid function  studies 03/20/2022 -Continue Solu-Cortef>>wean -Follow-up cortisol>>66.2 -repeat TSH 30.150>>9.640 -now transitioned to focus on comfort measures   CKD stage IIIb -Baseline creatinine 1.2-1.4 -Monitor serial BMP   Diabetes mellitus type 2, controlled -NovoLog sliding scale -Hemoglobin V2Z--3.6 -Was on Trulicity outpatient   Thrombocytopenia -Secondary to infectious process -U44--034 -Folic VQQV--9.5   Essential hypertension -Holding Bystolic secondary to soft blood pressure initially   Major neurocognitive disorder -Baseline as discussed above in the history   GOC -updated family>>confirms DNR -continue care for now with understanding of transition to comfort measures if no improvement clinically -now transitioned to focus on comfort measures             Family Communication:   daughter updated 12/17   Consultants:  none   Code Status:  DNR   DVT Prophylaxis:  SCDs     Procedures: As Listed in Progress Note Above   Antibiotics: Vanc 12/14>> Cefepime 12/14>12//15 Zosyn 12/15>>         Subjective: Pt is somnolent.  ROS not possible  Objective: Vitals:   03/27/2022 0800 03/20/2022 0900 03/13/2022 1000 03/20/2022 1136  BP: (!) 141/65 (!) 144/66 (!) 150/82   Pulse: 71 69 73 72  Resp: '19 18 10   '$ Temp:    97.9 F (36.6 C)  TempSrc:    Rectal  SpO2: 98% 98% 98% 98%  Weight:      Height:        Intake/Output Summary (Last 24 hours) at 03/25/2022 1405 Last data filed at 03/26/2022 1344 Gross per 24 hour  Intake 2014.01 ml  Output 250 ml  Net 1764.01 ml   Weight change: 0.1 kg Exam:  General:  Pt is somnolent, does not follow commands appropriately, not in acute distress HEENT: No icterus, No thrush, No neck mass, [place]/AT Cardiovascular: RRR, S1/S2, no rubs, no gallops Respiratory: bibasilar rales. No wheeze Abdomen: Soft/+BS,non distended, no guarding Extremities: No edema, No lymphangitis, No petechiae, No rashes, no synovitis   Data  Reviewed: I have personally reviewed following labs and imaging studies Basic Metabolic Panel: Recent Labs  Lab 03/18/22 1422 03/18/22 1435 03/18/22 1450 03/18/22 2318 03/19/22 0439 03/20/22 0349 03/22/2022 0441  NA 145 147*  --  144 146* 146* 144  K 3.5 4.9  --  3.6 3.4* 4.7 4.5  CL 114* 115*  --  117* 116* 118* 119*  CO2 24  --   --  19* 16* 19* 15*  GLUCOSE 118* 116*  --  169* 187* 113* 141*  BUN 40* 52*  --  36* 38* 44* 51*  CREATININE 1.26* 1.30*  --  1.30* 1.40* 2.12* 2.53*  CALCIUM 8.9  --   --  8.5* 8.7* 8.6* 8.6*  MG  --   --  1.7  --  1.6* 2.1 2.3   Liver Function Tests: Recent Labs  Lab 03/18/22 1422 03/20/22 0349 04/04/2022 0441  AST 39 30 32  ALT 49* 35 37  ALKPHOS 88 71 81  BILITOT 0.3 0.6 0.6  PROT 7.2 6.2* 5.9*  ALBUMIN 3.2* 2.7* 2.5*   No results for input(s): "LIPASE", "AMYLASE" in the last 168 hours. Recent Labs  Lab 03/19/22 1106  AMMONIA 23   Coagulation Profile: Recent Labs  Lab 03/18/22 1657  INR 1.0   CBC: Recent Labs  Lab 03/18/22 1422 03/18/22 1435 03/19/22 0439 03/20/22 0349 03/18/2022 0738  WBC 3.5*  --  7.3 10.9*  9.9  NEUTROABS 2.2  --   --   --   --   HGB 12.4 12.9 10.6* 10.2* 9.3*  HCT 38.9 38.0 32.9* 30.5* 27.6*  MCV 94.0  --  92.7 91.0 90.2  PLT 81*  --  84* 62* 55*   Cardiac Enzymes: Recent Labs  Lab 03/19/22 0917 04/03/2022 0441  CKTOTAL 15* 58   BNP: Invalid input(s): "POCBNP" CBG: Recent Labs  Lab 03/20/22 1131 03/20/22 1753 04/04/2022 0013 04/03/2022 0632 03/26/2022 1138  GLUCAP 143* 141* 148* 121* 109*   HbA1C: Recent Labs    03/19/22 0439  HGBA1C 6.7*   Urine analysis:    Component Value Date/Time   COLORURINE YELLOW 03/18/2022 1430   APPEARANCEUR CLOUDY (A) 03/18/2022 1430   LABSPEC 1.012 03/18/2022 1430   PHURINE 8.0 03/18/2022 1430   GLUCOSEU NEGATIVE 03/18/2022 1430   HGBUR SMALL (A) 03/18/2022 1430   BILIRUBINUR NEGATIVE 03/18/2022 1430   BILIRUBINUR neg 04/24/2013 0933   KETONESUR  NEGATIVE 03/18/2022 1430   PROTEINUR 100 (A) 03/18/2022 1430   UROBILINOGEN negative 04/24/2013 0933   NITRITE NEGATIVE 03/18/2022 1430   LEUKOCYTESUR MODERATE (A) 03/18/2022 1430   Sepsis Labs: '@LABRCNTIP'$ (procalcitonin:4,lacticidven:4) ) Recent Results (from the past 240 hour(s))  Blood Culture (routine x 2)     Status: None (Preliminary result)   Collection Time: 03/18/22  2:29 PM   Specimen: BLOOD  Result Value Ref Range Status   Specimen Description BLOOD LEFT ASSIST CONTROL  Final   Special Requests   Final    BOTTLES DRAWN AEROBIC AND ANAEROBIC Blood Culture adequate volume   Culture   Final    NO GROWTH 3 DAYS Performed at [facility], [address], [place], [place] [address]    Report Status PENDING  Incomplete  Blood Culture (routine x 2)     Status: None (Preliminary result)   Collection Time: 03/18/22  2:30 PM   Specimen: BLOOD  Result Value Ref Range Status   Specimen Description BLOOD RIGHT ARM  Final   Special Requests   Final    BOTTLES DRAWN AEROBIC AND ANAEROBIC Blood Culture results may not be optimal due to an inadequate volume of blood received in culture bottles   Culture   Final    NO GROWTH 3 DAYS Performed at [facility], [address] [place], [place] [address]    Report Status PENDING  Incomplete  Urine Culture     Status: Abnormal   Collection Time: 03/18/22  2:30 PM   Specimen: Urine, Catheterized  Result Value Ref Range Status   Specimen Description   Final    URINE, CATHETERIZED Performed at [facility], [address], [place], [place] [address]    Special Requests   Final    NONE Performed at [facility], [address] [place], [place] [address]    Culture (A)  Final    20,000 COLONIES/mL STREPTOCOCCUS AGALACTIAE TESTING AGAINST S. AGALACTIAE NOT ROUTINELY PERFORMED DUE TO PREDICTABILITY OF AMP/PEN/VAN SUSCEPTIBILITY. AMONG MIXED ORGANISMS Performed at [facility], [place] [address], [place], [place] [address]    Report  Status 03/20/2022 FINAL  Final  Resp panel by RT-PCR (RSV, Flu A&B, Covid) Anterior Nasal Swab     Status: None   Collection Time: 03/18/22  6:18 PM   Specimen: Anterior Nasal Swab  Result Value Ref Range Status   SARS Coronavirus 2 by RT PCR NEGATIVE NEGATIVE Final    Comment: (NOTE) SARS-CoV-2 target nucleic acids are NOT DETECTED.  The  SARS-CoV-2 RNA is generally detectable in upper respiratory specimens during the acute phase of infection. The lowest concentration of SARS-CoV-2 viral copies this assay can detect is 138 copies/mL. A negative result does not preclude SARS-Cov-2 infection and should not be used as the sole basis for treatment or other patient management decisions. A negative result may occur with  improper specimen collection/handling, submission of specimen other than nasopharyngeal swab, presence of viral mutation(s) within the areas targeted by this assay, and inadequate number of viral copies(<138 copies/mL). A negative result must be combined with clinical observations, patient history, and epidemiological information. The expected result is Negative.  Fact Sheet for Patients:  EntrepreneurPulse.com.au  Fact Sheet for Healthcare Providers:  IncredibleEmployment.be  This test is no t yet approved or cleared by the [place] FDA and  has been authorized for detection and/or diagnosis of SARS-CoV-2 by FDA under an Emergency Use Authorization (EUA). This EUA will remain  in effect (meaning this test can be used) for the duration of the COVID-19 declaration under Section 564(b)(1) of the Act, 21 U.S.C.section 360bbb-3(b)(1), unless the authorization is terminated  or revoked sooner.       Influenza A by PCR NEGATIVE NEGATIVE Final   Influenza B by PCR NEGATIVE NEGATIVE Final    Comment: (NOTE) The Xpert Xpress SARS-CoV-2/FLU/RSV plus assay is intended as an aid in the diagnosis of influenza from Nasopharyngeal swab  specimens and should not be used as a sole basis for treatment. Nasal washings and aspirates are unacceptable for Xpert Xpress SARS-CoV-2/FLU/RSV testing.  Fact Sheet for Patients: EntrepreneurPulse.com.au  Fact Sheet for Healthcare Providers: IncredibleEmployment.be  This test is not yet approved or cleared by the [place] FDA and has been authorized for detection and/or diagnosis of SARS-CoV-2 by FDA under an Emergency Use Authorization (EUA). This EUA will remain in effect (meaning this test can be used) for the duration of the COVID-19 declaration under Section 564(b)(1) of the Act, 21 U.S.C. section 360bbb-3(b)(1), unless the authorization is terminated or revoked.     Resp Syncytial Virus by PCR NEGATIVE NEGATIVE Final    Comment: (NOTE) Fact Sheet for Patients: EntrepreneurPulse.com.au  Fact Sheet for Healthcare Providers: IncredibleEmployment.be  This test is not yet approved or cleared by the [place] FDA and has been authorized for detection and/or diagnosis of SARS-CoV-2 by FDA under an Emergency Use Authorization (EUA). This EUA will remain in effect (meaning this test can be used) for the duration of the COVID-19 declaration under Section 564(b)(1) of the Act, 21 U.S.C. section 360bbb-3(b)(1), unless the authorization is terminated or revoked.  Performed at [facility], [address] [place], [place] [address]   MRSA Next Gen by PCR, Nasal     Status: Abnormal   Collection Time: 03/18/22  8:33 PM   Specimen: Nasal Mucosa; Nasal Swab  Result Value Ref Range Status   MRSA by PCR Next Gen DETECTED (A) NOT DETECTED Final    Comment: RESULT CALLED TO, READ BACK BY AND VERIFIED WITH: KING @ 0726 ON [id number] BY HENDERSON L (NOTE) The GeneXpert MRSA Assay (FDA approved for NASAL specimens only), is one component of a comprehensive MRSA colonization surveillance program. It is not  intended to diagnose MRSA infection nor to guide or monitor treatment for MRSA infections. Test performance is not FDA approved in patients less than 26 years old. Performed at [facility], [address], [place],  [address]      Scheduled Meds: Continuous Infusions:  Procedures/Studies: MR BRAIN WO CONTRAST  Result Date: 03/19/2022 CLINICAL DATA:  Mental status change, persistent or worsening. Right gaze preference EXAM: MRI HEAD WITHOUT CONTRAST TECHNIQUE: Multiplanar, multiecho pulse sequences of the brain and surrounding structures were obtained without intravenous contrast. COMPARISON:  Head CT from yesterday FINDINGS: Brain: No acute infarction, hemorrhage, hydrocephalus, extra-axial collection or mass lesion. Generalized brain atrophy, particularly pronounced in the medial temporal lobes where there is marked sulcal widening and some FLAIR hyperintensity unusual streaky T2 hyperintensity in the ventral medulla, without swelling or clear volume loss, chronic appearing. Finally, there is atypical T2 hyperintensity peripheral to the bilateral putamen. Mild chronic small vessel ischemic type change in the periventricular white matter. Vascular: Major flow voids are preserved Skull and upper cervical spine: No focal marrow lesion Sinuses/Orbits: Bilateral cataract resection IMPRESSION: No acute finding including infarct. Unusual pattern of signal abnormality in the medulla, putamen region, and medial temporal lobes with a background of generalized brain atrophy, question multi system atrophy. Electronically Signed   By: Jorje Guild M.D.   On: 03/19/2022 13:04   CT Head Wo Contrast  Result Date: 03/18/2022 CLINICAL DATA:  Delirium EXAM: CT HEAD WITHOUT CONTRAST TECHNIQUE: Contiguous axial images were obtained from the base of the skull through the vertex without intravenous contrast. RADIATION DOSE REDUCTION: This exam was performed according to the departmental dose-optimization  program which includes automated exposure control, adjustment of the mA and/or kV according to patient size and/or use of iterative reconstruction technique. COMPARISON:  CT head 05/23/2010 report without imaging BRAIN: BRAIN Cerebral ventricle sizes are concordant with the degree of cerebral volume loss. Patchy and confluent areas of decreased attenuation are noted throughout the deep and periventricular white matter of the cerebral hemispheres bilaterally, compatible with chronic microvascular ischemic disease. No evidence of large-territorial acute infarction. No parenchymal hemorrhage. No mass lesion. No extra-axial collection. No mass effect or midline shift. No hydrocephalus. Basilar cisterns are patent. Vascular: No hyperdense vessel. Atherosclerotic calcifications are present within the cavernous internal carotid arteries. Skull: No acute fracture or focal lesion. Sinuses/Orbits: Left maxillary sinus mucosal thickening. Otherwise paranasal sinuses and mastoid air cells are clear. Bilateral lens replacement. Left scleral buckle. Otherwise the orbits are unremarkable. Other: None. IMPRESSION: No acute intracranial abnormality. Electronically Signed   By: Iven Finn M.D.   On: 03/18/2022 18:07   CT CHEST ABDOMEN PELVIS W CONTRAST  Result Date: 03/18/2022 CLINICAL DATA:  Sepsis EXAM: CT CHEST, ABDOMEN, AND PELVIS WITH CONTRAST TECHNIQUE: Multidetector CT imaging of the chest, abdomen and pelvis was performed following the standard protocol during bolus administration of intravenous contrast. RADIATION DOSE REDUCTION: This exam was performed according to the departmental dose-optimization program which includes automated exposure control, adjustment of the mA and/or kV according to patient size and/or use of iterative reconstruction technique. CONTRAST:  19m OMNIPAQUE IOHEXOL 300 MG/ML  SOLN COMPARISON:  None Available. FINDINGS: CT CHEST FINDINGS Cardiovascular: Normal heart size. No significant  pericardial effusion. The thoracic aorta is normal in caliber. Severe atherosclerotic plaque of the thoracic aorta. At least 2 vessel coronary artery calcifications. The main pulmonary artery is normal in caliber. No central pulmonary embolus. Mediastinum/Nodes: No enlarged mediastinal, hilar, or axillary lymph nodes. Heterogeneous partially calcified thyroid glands. Trachea and esophagus demonstrate no significant findings. Lungs/Pleura: Expiratory phase of respiration. Bilateral lower lobe atelectasis. No focal consolidation. Subpleural right upper lobe micronodule (4:62, 5:85). No pulmonary mass. No pleural effusion. No pneumothorax. Musculoskeletal: No chest wall abnormality. No suspicious lytic or blastic osseous lesions. No acute displaced fracture. CT ABDOMEN PELVIS FINDINGS  Hepatobiliary: No focal liver abnormality. Status post cholecystectomy. No intrahepatic biliary dilatation. Mild extrahepatic biliary ductal dilatation which can be seen in the post cholecystectomy setting. Pancreas: Diffusely atrophic. No focal lesion. Otherwise normal pancreatic contour. No surrounding inflammatory changes. No main pancreatic ductal dilatation. Spleen: Normal in size without focal abnormality. Adrenals/Urinary Tract: No adrenal nodule bilaterally. Bilateral kidneys enhance symmetrically. No hydronephrosis. No hydroureter. The urinary bladder is unremarkable. Foley catheter tip and balloon terminate within the urinary bladder lumen. On delayed imaging, there is no urothelial wall thickening and there are no filling defects in the opacified portions of the bilateral collecting systems or ureters. Stomach/Bowel: Stomach is within normal limits. No evidence of bowel wall thickening or dilatation. Appendix appears normal. Vascular/Lymphatic: No abdominal aorta or iliac aneurysm. Severe atherosclerotic plaque of the aorta and its branches. No abdominal, pelvic, or inguinal lymphadenopathy. Reproductive: Pericentimeter  intramural calcification likely representative of a degenerative uterine fibroid. Heterogeneous in thickened (12 mm). Endometrium. Otherwise uterus and bilateral adnexa are unremarkable. Other: No intraperitoneal free fluid. No intraperitoneal free gas. No organized fluid collection. Musculoskeletal: No abdominal wall hernia or abnormality. Trace subcutaneus soft tissue emphysema along the anterior lower abdominal wall suggestive of medication injections (2:80). No suspicious lytic or blastic osseous lesions. No acute displaced fracture. Grade 1 anterolisthesis of L4 on L5. Intervertebral disc space vacuum phenomenon at the L4-L5 and L5-S1 levels. Posterior disc osteophyte complex formation at the L5 on S1 level. IMPRESSION: 1. Heterogeneous and thickened endometrium. Recommend pelvic ultrasound for further evaluation. 2. Heterogeneous partially calcified thyroid glands. In the setting of significant comorbidities or limited life expectancy, no follow-up recommended (ref: J Am Coll Radiol. 2015 Feb;12(2): 143-50). 3. Subpleural right upper lobe micronodule. No follow-up needed if patient is low-risk.This recommendation follows the consensus statement: Guidelines for Management of Incidental Pulmonary Nodules Detected on CT Images: From the Fleischner Society 2017; Radiology 2017; 284:228-243. 4. Trace subcutaneus soft tissue emphysema along the anterior lower abdominal wall suggestive of medication injections. Electronically Signed   By: Iven Finn M.D.   On: 03/18/2022 18:03   DG Chest Port 1 View  Result Date: 03/18/2022 CLINICAL DATA:  Questionable sepsis in an 88 year old female. EXAM: PORTABLE CHEST 1 VIEW COMPARISON:  None available. FINDINGS: EKG leads project over the chest. Image rotated slightly to the RIGHT. Cardiomediastinal contours and hilar structures unremarkable to the extent evaluated on this nonstandard AP projection accounting for rotation. No lobar level consolidation. Scattered  airspace opacities throughout the chest mainly with linear appearance. No pneumothorax. On limited assessment there is no acute skeletal process. IMPRESSION: Scattered airspace opacities throughout the chest mainly with linear appearance. Findings favor scattered atelectasis. Difficult to exclude infection at the lung bases. Electronically Signed   By: Zetta Bills M.D.   On: 03/18/2022 15:22    Orson Eva, DO  [facility]  If 7PM-7AM, please contact night-coverage [email or web address] Password [email or web address] 03/28/2022, 2:05 PM   LOS: 3 days

## 2022-03-21 NOTE — Progress Notes (Signed)
HR beginning to drop in the 43s, daughter Lorna Dibble called and made aware. Patients daughter is in route back to the hospital.   Family in the room made aware.

## 2022-03-22 ENCOUNTER — Inpatient Hospital Stay (HOSPITAL_COMMUNITY)
Admission: RE | Admit: 2022-03-22 | Discharge: 2022-03-23 | DRG: 951 | Disposition: A | Discharge status: Hospice | Source: Intra-hospital | Attending: Internal Medicine | Admitting: Internal Medicine

## 2022-03-22 DIAGNOSIS — E035 Myxedema coma: Secondary | ICD-10-CM | POA: Diagnosis not present

## 2022-03-22 DIAGNOSIS — R6520 Severe sepsis without septic shock: Secondary | ICD-10-CM | POA: Diagnosis not present

## 2022-03-22 DIAGNOSIS — Z9049 Acquired absence of other specified parts of digestive tract: Secondary | ICD-10-CM

## 2022-03-22 DIAGNOSIS — N1832 Chronic kidney disease, stage 3b: Secondary | ICD-10-CM | POA: Diagnosis present

## 2022-03-22 DIAGNOSIS — N179 Acute kidney failure, unspecified: Secondary | ICD-10-CM | POA: Diagnosis not present

## 2022-03-22 DIAGNOSIS — N3001 Acute cystitis with hematuria: Secondary | ICD-10-CM

## 2022-03-22 DIAGNOSIS — N189 Chronic kidney disease, unspecified: Secondary | ICD-10-CM | POA: Diagnosis not present

## 2022-03-22 DIAGNOSIS — Z66 Do not resuscitate: Secondary | ICD-10-CM | POA: Diagnosis present

## 2022-03-22 DIAGNOSIS — D6959 Other secondary thrombocytopenia: Secondary | ICD-10-CM | POA: Diagnosis present

## 2022-03-22 DIAGNOSIS — J9811 Atelectasis: Secondary | ICD-10-CM | POA: Diagnosis present

## 2022-03-22 DIAGNOSIS — F0390 Unspecified dementia without behavioral disturbance: Secondary | ICD-10-CM | POA: Diagnosis present

## 2022-03-22 DIAGNOSIS — H5462 Unqualified visual loss, left eye, normal vision right eye: Secondary | ICD-10-CM | POA: Diagnosis present

## 2022-03-22 DIAGNOSIS — Z981 Arthrodesis status: Secondary | ICD-10-CM | POA: Diagnosis not present

## 2022-03-22 DIAGNOSIS — A419 Sepsis, unspecified organism: Secondary | ICD-10-CM | POA: Diagnosis not present

## 2022-03-22 DIAGNOSIS — Z515 Encounter for palliative care: Principal | ICD-10-CM

## 2022-03-22 DIAGNOSIS — N390 Urinary tract infection, site not specified: Secondary | ICD-10-CM | POA: Diagnosis present

## 2022-03-22 DIAGNOSIS — E1122 Type 2 diabetes mellitus with diabetic chronic kidney disease: Secondary | ICD-10-CM | POA: Diagnosis present

## 2022-03-22 DIAGNOSIS — Z1152 Encounter for screening for COVID-19: Secondary | ICD-10-CM

## 2022-03-22 DIAGNOSIS — H409 Unspecified glaucoma: Secondary | ICD-10-CM | POA: Diagnosis present

## 2022-03-22 DIAGNOSIS — G9341 Metabolic encephalopathy: Secondary | ICD-10-CM | POA: Diagnosis present

## 2022-03-22 DIAGNOSIS — R9389 Abnormal findings on diagnostic imaging of other specified body structures: Secondary | ICD-10-CM | POA: Diagnosis present

## 2022-03-22 DIAGNOSIS — Z993 Dependence on wheelchair: Secondary | ICD-10-CM | POA: Diagnosis not present

## 2022-03-22 DIAGNOSIS — Z86010 Personal history of colonic polyps: Secondary | ICD-10-CM

## 2022-03-22 DIAGNOSIS — E785 Hyperlipidemia, unspecified: Secondary | ICD-10-CM | POA: Diagnosis present

## 2022-03-22 DIAGNOSIS — Z820 Family history of epilepsy and other diseases of the nervous system: Secondary | ICD-10-CM

## 2022-03-22 DIAGNOSIS — E869 Volume depletion, unspecified: Secondary | ICD-10-CM | POA: Diagnosis present

## 2022-03-22 DIAGNOSIS — I129 Hypertensive chronic kidney disease with stage 1 through stage 4 chronic kidney disease, or unspecified chronic kidney disease: Secondary | ICD-10-CM | POA: Diagnosis present

## 2022-03-22 DIAGNOSIS — D696 Thrombocytopenia, unspecified: Secondary | ICD-10-CM | POA: Diagnosis not present

## 2022-03-22 MED ORDER — POLYVINYL ALCOHOL 1.4 % OP SOLN: 1.0000 [drp] | Freq: Four times a day (QID) | OPHTHALMIC | Status: DC | PRN

## 2022-03-22 MED ORDER — LORAZEPAM 1 MG PO TABS: 1.0000 mg | ORAL_TABLET | ORAL | Status: DC | PRN

## 2022-03-22 MED ORDER — HALOPERIDOL LACTATE 2 MG/ML PO CONC: 0.5000 mg | ORAL | Status: DC | PRN

## 2022-03-22 MED ORDER — GLYCOPYRROLATE 1 MG PO TABS: 1.0000 mg | ORAL_TABLET | ORAL | Status: DC | PRN

## 2022-03-22 MED ORDER — ACETAMINOPHEN 325 MG PO TABS: 650.0000 mg | ORAL_TABLET | Freq: Four times a day (QID) | ORAL | Status: DC | PRN

## 2022-03-22 MED ORDER — GLYCOPYRROLATE 0.2 MG/ML IJ SOLN: 0.2000 mg | INTRAMUSCULAR | Status: DC | PRN

## 2022-03-22 MED ORDER — LORAZEPAM 2 MG/ML PO CONC: 1.0000 mg | ORAL | Status: DC | PRN

## 2022-03-22 MED ORDER — HALOPERIDOL 0.5 MG PO TABS: 0.5000 mg | ORAL_TABLET | ORAL | Status: DC | PRN

## 2022-03-22 MED ORDER — BIOTENE DRY MOUTH MT LIQD: 15.0000 mL | OROMUCOSAL | Status: DC | PRN

## 2022-03-22 MED ORDER — ONDANSETRON HCL 4 MG/2ML IJ SOLN: 4.0000 mg | Freq: Four times a day (QID) | INTRAMUSCULAR | Status: DC | PRN

## 2022-03-22 MED ORDER — ONDANSETRON 4 MG PO TBDP: 4.0000 mg | ORAL_TABLET | Freq: Four times a day (QID) | ORAL | Status: DC | PRN

## 2022-03-22 MED ORDER — ACETAMINOPHEN 650 MG RE SUPP: 650.0000 mg | Freq: Four times a day (QID) | RECTAL | Status: DC | PRN

## 2022-03-22 MED ORDER — HALOPERIDOL LACTATE 5 MG/ML IJ SOLN: 0.5000 mg | INTRAMUSCULAR | Status: DC | PRN

## 2022-03-22 MED ORDER — MORPHINE SULFATE (PF) 2 MG/ML IV SOLN
1.0000 mg | INTRAVENOUS | Status: DC | PRN
  Filled 2022-03-22: qty 1

## 2022-03-22 MED ORDER — LORAZEPAM 2 MG/ML IJ SOLN: 1.0000 mg | INTRAMUSCULAR | Status: DC | PRN

## 2022-03-22 NOTE — Progress Notes (Signed)
Chaplain made initial visit today and found daughter bedside. Provided spiritual support, opportunity for reflection and life review today. Daughter shared about Oretta's upbringing, spiritual heritage, relationships, and illness story. Daughter is tearful but appropriate in her grief sharing how much her mother is leaving behind in her 38 grandchildren and 34 great grand children. Patient seems very comfortable. Prayer provided bedside. Chaplain will remain available in order to provide spiritual support and to assess for spiritual need.   Rev. Bennie Pierini, M.Div Chaplain

## 2022-03-22 NOTE — Discharge Summary (Signed)
Physician Discharge Summary   Patient: Jackie Hickman MRN: [id number] DOB: 1932/05/01  Admit date:     03/18/2022  Discharge date: 03/22/22  Discharge Physician: Shanon Brow Dlynn Ranes   PCP: System, Provider Not In    .  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 *  Hospital Course: 88 y.o. female with medical history significant for diabetes mellitus, hypertension alcohol history. Comes from [facility] with reports of altered mental status and hypothermia.  Per nursing home, patient was diagnosed with UTI 3 days ago and was placed on Macrobid.  At the time of my evaluation, patient is somnolent and not answering questions.   The patient's daughter [name] last saw her mom last 1 week ago and then today.  She believes alteration in mental status started at least 4 days ago when she was diagnosed with UTI and started on Macrobid.  At baseline patient has dementia, good and bad days, sometimes she recognizes family, sometimes she does not.  She ambulates with a wheelchair.  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   ED does not Course: Temperature 98.6.  Heart rate 43 improved to 60s as patient warmed up, respiratory rate 11-23.  Blood pressure systolic 409W to 119J.  Lactic acid 2.1 > 1.9..  Sodium 145.  WBC 3.5.  UA with moderate leukocytes few bacteria.  Chest x-ray showing atelectasis, CTA chest abdomen and pelvis with contrast without significant findings except for heterogeneous and thickened endometrium. TSH -markedly elevated at 30.15. -Broad-spectrum antibiotics IV vancomycin and cefepime started.  1 L bolus LR given. ,  With maintenance at 125.   Hospitalist to  admit Myxedema Coma, UTI. Pt was continued on IV abx and IV fluids.  IV synthroid and hydrocortisone was continued.  There was clinical concern for seizure.  Pt was given IV ativan and keppra without much improvement in mental status. Unfortunately, the patient did not improve clinically.  [place] discussions were held each day with family at bedside.  They did not want patient transferred to [facility] or other tertiary care center.   We discussed the patient's overall poor prognosis given her advanced age, frailty, poor baseline function in the setting of her comorbidities and severity of her current illness.  We discussed that patient will unlikely return to her baseline premorbid function poor as that may have been.  Family expressed that patient would not have wanted to even have this type of aggressive care presently.  As patient did not improve despite optimal treatment, family expressed desire to transition patient's focus of care to focus on comfort measures.  Assessment and Plan: Severe Sepsis -present on admission -presented with hypothermia, tachypnea and leukopenia -due to UTI -CT CAP--bibasilar atelectasis; RUL micronodule; heterogenous and thickened endometrium 12 mm, pericardiac atrophy, status post cholecystectomy. -Continue empiric vancomycin and cefepime pending culture data -Lactic 2.1>>1.9 -now transitioned to focus on comfort measures   UTI -UA >50 WBC -Continue vancomycin and cefepime pending culture data -now transitioned to focus on comfort measures   Acute metabolic encephalopathy -Continues to be obtunded -Multifactorial including sepsis and possible myxedema coma -repeat TSH 30.150>>9.640 -Continue antibiotics -ABG--7.4 5/27/100/18 on room air -B12--689 -Check ammonia--23 -  Folic IRJJ--8.8 -MRI brain--no acute findings -CT head negative for acute findings -started thiamine given Etoh hx -EEG--continuous poorly-formed generalized periodic discharges at 1.5-2 Hz.  This pattern is on the ictal-interictal continuum with low-to-moderate risk of seizures>>may be seen in the setting of cefepime  -discontinued cefepime -as mental status not improved>>give ativan x 1 and load Keppra>>no improvement -family did not want transfer to tertiary care center or further aggressive care -now transitioned to focus on comfort measures   Hypothyroidism/myxedema -Continue IV Synthroid -She was given Synthroid IV 200 mcg x 1 -Repeat thyroid function studies 03/20/2022 -Continue Solu-Cortef>>wean -Follow-up cortisol>>66.2 -repeat TSH 30.150>>9.640 -now transitioned to focus on comfort measures   Acute on chronic renal failure-CKD stage IIIb -Baseline creatinine 1.2-1.4 -Monitor serial BMP -secondary to volume depletion and hemodynamic changes -care now transitioned to full comfort measures   Diabetes mellitus type 2, controlled -NovoLog sliding scale -Hemoglobin C1Y--6.0 -Was on Trulicity outpatient   Thrombocytopenia -Secondary to infectious process -Y30--160 -Folic FUXN--2.3 -now transitioned to comfort measures care   Essential hypertension -Holding Bystolic secondary to soft blood pressure initially   Major neurocognitive disorder -Baseline as discussed above in the history   GOC -updated family>>confirms DNR -continue care for now with understanding of transition to comfort measures if no improvement clinically -now transitioned to focus on comfort measures         Consultants: none Procedures performed: none  Disposition: Residential Hospice Diet recommendation:  Comfort Feeds DISCHARGE MEDICATION: Allergies as of 03/22/2022   No Known Allergies      Medication List     STOP taking these medications    allopurinol 100 MG tablet Commonly known as: ZYLOPRIM   brimonidine 0.2 % ophthalmic solution Commonly known as: ALPHAGAN   cefTRIAXone 2 g injection Commonly known as: ROCEPHIN   Colace 100 MG capsule Generic drug:  docusate sodium   Combigan 0.2-0.5 % ophthalmic solution Generic drug: brimonidine-timolol   fenofibrate 145 MG tablet Commonly known as: TRICOR   fosfomycin 3 g Pack Commonly known as: MONUROL   HYDROcodone-acetaminophen 5-325 MG tablet Commonly known as: NORCO/VICODIN   latanoprost 0.005 % ophthalmic solution Commonly known as: XALATAN   nebivolol 10 MG tablet Commonly known as: Bystolic   nitrofurantoin (macrocrystal-monohydrate) 100 MG capsule Commonly known as: MACROBID   prednisoLONE acetate 1 % ophthalmic suspension Commonly known as: PRED FORTE   Rocklatan 0.02-0.005 % Soln Generic drug: Netarsudil-Latanoprost   sodium chloride 5 % ophthalmic solution Commonly known as: MURO 557   Trulicity 3.22 GU/5.4YH Sopn Generic drug: Dulaglutide        Discharge Exam: Filed Weights   03/18/22 2041 03/20/22 0329 03/24/2022 0500  Weight: 77.5 kg 80.1 kg 80.2 kg   HEENT:  [place]/AT, No thrush, no icterus CV:  RRR, no rub, no S3, no S4 Lung:  scattered rales Abd:  soft/+BS, NT Ext:  1 + LE edema, no lymphangitis, no synovitis, no rash   Condition at discharge: stable  The results of significant diagnostics from this hospitalization (including imaging, microbiology, ancillary and laboratory) are listed below for reference.   Imaging Studies: MR BRAIN WO CONTRAST  Result Date: 03/19/2022 CLINICAL DATA:  Mental status change, persistent or worsening. Right gaze preference EXAM: MRI HEAD WITHOUT CONTRAST TECHNIQUE: Multiplanar, multiecho pulse sequences of the brain and surrounding structures were obtained without intravenous contrast. COMPARISON:  Head CT from yesterday FINDINGS: Brain: No acute infarction, hemorrhage, hydrocephalus, extra-axial collection or mass lesion. Generalized brain atrophy, particularly pronounced in the medial temporal lobes where there  is marked sulcal widening and some FLAIR hyperintensity unusual streaky T2 hyperintensity in the ventral medulla,  without swelling or clear volume loss, chronic appearing. Finally, there is atypical T2 hyperintensity peripheral to the bilateral putamen. Mild chronic small vessel ischemic type change in the periventricular white matter. Vascular: Major flow voids are preserved Skull and upper cervical spine: No focal marrow lesion Sinuses/Orbits: Bilateral cataract resection IMPRESSION: No acute finding including infarct. Unusual pattern of signal abnormality in the medulla, putamen region, and medial temporal lobes with a background of generalized brain atrophy, question multi system atrophy. Electronically Signed   By: Jorje Guild M.D.   On: 03/19/2022 13:04   CT Head Wo Contrast  Result Date: 03/18/2022 CLINICAL DATA:  Delirium EXAM: CT HEAD WITHOUT CONTRAST TECHNIQUE: Contiguous axial images were obtained from the base of the skull through the vertex without intravenous contrast. RADIATION DOSE REDUCTION: This exam was performed according to the departmental dose-optimization program which includes automated exposure control, adjustment of the mA and/or kV according to patient size and/or use of iterative reconstruction technique. COMPARISON:  CT head 05/23/2010 report without imaging BRAIN: BRAIN Cerebral ventricle sizes are concordant with the degree of cerebral volume loss. Patchy and confluent areas of decreased attenuation are noted throughout the deep and periventricular white matter of the cerebral hemispheres bilaterally, compatible with chronic microvascular ischemic disease. No evidence of large-territorial acute infarction. No parenchymal hemorrhage. No mass lesion. No extra-axial collection. No mass effect or midline shift. No hydrocephalus. Basilar cisterns are patent. Vascular: No hyperdense vessel. Atherosclerotic calcifications are present within the cavernous internal carotid arteries. Skull: No acute fracture or focal lesion. Sinuses/Orbits: Left maxillary sinus mucosal thickening. Otherwise  paranasal sinuses and mastoid air cells are clear. Bilateral lens replacement. Left scleral buckle. Otherwise the orbits are unremarkable. Other: None. IMPRESSION: No acute intracranial abnormality. Electronically Signed   By: Iven Finn M.D.   On: 03/18/2022 18:07   CT CHEST ABDOMEN PELVIS W CONTRAST  Result Date: 03/18/2022 CLINICAL DATA:  Sepsis EXAM: CT CHEST, ABDOMEN, AND PELVIS WITH CONTRAST TECHNIQUE: Multidetector CT imaging of the chest, abdomen and pelvis was performed following the standard protocol during bolus administration of intravenous contrast. RADIATION DOSE REDUCTION: This exam was performed according to the departmental dose-optimization program which includes automated exposure control, adjustment of the mA and/or kV according to patient size and/or use of iterative reconstruction technique. CONTRAST:  75m OMNIPAQUE IOHEXOL 300 MG/ML  SOLN COMPARISON:  None Available. FINDINGS: CT CHEST FINDINGS Cardiovascular: Normal heart size. No significant pericardial effusion. The thoracic aorta is normal in caliber. Severe atherosclerotic plaque of the thoracic aorta. At least 2 vessel coronary artery calcifications. The main pulmonary artery is normal in caliber. No central pulmonary embolus. Mediastinum/Nodes: No enlarged mediastinal, hilar, or axillary lymph nodes. Heterogeneous partially calcified thyroid glands. Trachea and esophagus demonstrate no significant findings. Lungs/Pleura: Expiratory phase of respiration. Bilateral lower lobe atelectasis. No focal consolidation. Subpleural right upper lobe micronodule (4:62, 5:85). No pulmonary mass. No pleural effusion. No pneumothorax. Musculoskeletal: No chest wall abnormality. No suspicious lytic or blastic osseous lesions. No acute displaced fracture. CT ABDOMEN PELVIS FINDINGS Hepatobiliary: No focal liver abnormality. Status post cholecystectomy. No intrahepatic biliary dilatation. Mild extrahepatic biliary ductal dilatation which can be  seen in the post cholecystectomy setting. Pancreas: Diffusely atrophic. No focal lesion. Otherwise normal pancreatic contour. No surrounding inflammatory changes. No main pancreatic ductal dilatation. Spleen: Normal in size without focal abnormality. Adrenals/Urinary Tract: No adrenal nodule bilaterally. Bilateral kidneys enhance symmetrically. No hydronephrosis. No  hydroureter. The urinary bladder is unremarkable. Foley catheter tip and balloon terminate within the urinary bladder lumen. On delayed imaging, there is no urothelial wall thickening and there are no filling defects in the opacified portions of the bilateral collecting systems or ureters. Stomach/Bowel: Stomach is within normal limits. No evidence of bowel wall thickening or dilatation. Appendix appears normal. Vascular/Lymphatic: No abdominal aorta or iliac aneurysm. Severe atherosclerotic plaque of the aorta and its branches. No abdominal, pelvic, or inguinal lymphadenopathy. Reproductive: Pericentimeter intramural calcification likely representative of a degenerative uterine fibroid. Heterogeneous in thickened (12 mm). Endometrium. Otherwise uterus and bilateral adnexa are unremarkable. Other: No intraperitoneal free fluid. No intraperitoneal free gas. No organized fluid collection. Musculoskeletal: No abdominal wall hernia or abnormality. Trace subcutaneus soft tissue emphysema along the anterior lower abdominal wall suggestive of medication injections (2:80). No suspicious lytic or blastic osseous lesions. No acute displaced fracture. Grade 1 anterolisthesis of L4 on L5. Intervertebral disc space vacuum phenomenon at the L4-L5 and L5-S1 levels. Posterior disc osteophyte complex formation at the L5 on S1 level. IMPRESSION: 1. Heterogeneous and thickened endometrium. Recommend pelvic ultrasound for further evaluation. 2. Heterogeneous partially calcified thyroid glands. In the setting of significant comorbidities or limited life expectancy, no  follow-up recommended (ref: J Am Coll Radiol. 2015 Feb;12(2): 143-50). 3. Subpleural right upper lobe micronodule. No follow-up needed if patient is low-risk.This recommendation follows the consensus statement: Guidelines for Management of Incidental Pulmonary Nodules Detected on CT Images: From the Fleischner Society 2017; Radiology 2017; 284:228-243. 4. Trace subcutaneus soft tissue emphysema along the anterior lower abdominal wall suggestive of medication injections. Electronically Signed   By: Iven Finn M.D.   On: 03/18/2022 18:03   DG Chest Port 1 View  Result Date: 03/18/2022 CLINICAL DATA:  Questionable sepsis in an 88 year old female. EXAM: PORTABLE CHEST 1 VIEW COMPARISON:  None available. FINDINGS: EKG leads project over the chest. Image rotated slightly to the RIGHT. Cardiomediastinal contours and hilar structures unremarkable to the extent evaluated on this nonstandard AP projection accounting for rotation. No lobar level consolidation. Scattered airspace opacities throughout the chest mainly with linear appearance. No pneumothorax. On limited assessment there is no acute skeletal process. IMPRESSION: Scattered airspace opacities throughout the chest mainly with linear appearance. Findings favor scattered atelectasis. Difficult to exclude infection at the lung bases. Electronically Signed   By: Zetta Bills M.D.   On: 03/18/2022 15:22    Microbiology: Results for orders placed or performed during the hospital encounter of 03/18/22  Blood Culture (routine x 2)     Status: None (Preliminary result)   Collection Time: 03/18/22  2:29 PM   Specimen: BLOOD  Result Value Ref Range Status   Specimen Description BLOOD LEFT ASSIST CONTROL  Final   Special Requests   Final    BOTTLES DRAWN AEROBIC AND ANAEROBIC Blood Culture adequate volume   Culture   Final    NO GROWTH 4 DAYS Performed at [facility], [address], [place], [place] [address]    Report Status PENDING  Incomplete   Blood Culture (routine x 2)     Status: None (Preliminary result)   Collection Time: 03/18/22  2:30 PM   Specimen: BLOOD  Result Value Ref Range Status   Specimen Description BLOOD RIGHT ARM  Final   Special Requests   Final    BOTTLES DRAWN AEROBIC AND ANAEROBIC Blood Culture results may not be optimal due to an inadequate volume of blood received in culture bottles   Culture  Final    NO GROWTH 4 DAYS Performed at [facility], [address] [place], [place] [address]    Report Status PENDING  Incomplete  Urine Culture     Status: Abnormal   Collection Time: 03/18/22  2:30 PM   Specimen: Urine, Catheterized  Result Value Ref Range Status   Specimen Description   Final    URINE, CATHETERIZED Performed at [facility], [address], [place], [place] [address]    Special Requests   Final    NONE Performed at [facility], [address], [place], [place] [address]    Culture (A)  Final    20,000 COLONIES/mL STREPTOCOCCUS AGALACTIAE TESTING AGAINST S. AGALACTIAE NOT ROUTINELY PERFORMED DUE TO PREDICTABILITY OF AMP/PEN/VAN SUSCEPTIBILITY. AMONG MIXED ORGANISMS Performed at [facility], [place] [address], [place], [place] [address]    Report Status 03/20/2022 FINAL  Final  Resp panel by RT-PCR (RSV, Flu A&B, Covid) Anterior Nasal Swab     Status: None   Collection Time: 03/18/22  6:18 PM   Specimen: Anterior Nasal Swab  Result Value Ref Range Status   SARS Coronavirus 2 by RT PCR NEGATIVE NEGATIVE Final    Comment: (NOTE) SARS-CoV-2 target nucleic acids are NOT DETECTED.  The SARS-CoV-2 RNA is generally detectable in upper respiratory specimens during the acute phase of infection. The lowest concentration of SARS-CoV-2 viral copies this assay can detect is 138 copies/mL. A negative result does not preclude SARS-Cov-2 infection and should not be used as the sole basis for treatment or other patient management decisions. A negative result may occur with  improper  specimen collection/handling, submission of specimen other than nasopharyngeal swab, presence of viral mutation(s) within the areas targeted by this assay, and inadequate number of viral copies(<138 copies/mL). A negative result must be combined with clinical observations, patient history, and epidemiological information. The expected result is Negative.  Fact Sheet for Patients:  EntrepreneurPulse.com.au  Fact Sheet for Healthcare Providers:  IncredibleEmployment.be  This test is no t yet approved or cleared by the [place] FDA and  has been authorized for detection and/or diagnosis of SARS-CoV-2 by FDA under an Emergency Use Authorization (EUA). This EUA will remain  in effect (meaning this test can be used) for the duration of the COVID-19 declaration under Section 564(b)(1) of the Act, 21 U.S.C.section 360bbb-3(b)(1), unless the authorization is terminated  or revoked sooner.       Influenza A by PCR NEGATIVE NEGATIVE Final   Influenza B by PCR NEGATIVE NEGATIVE Final    Comment: (NOTE) The Xpert Xpress SARS-CoV-2/FLU/RSV plus assay is intended as an aid in the diagnosis of influenza from Nasopharyngeal swab specimens and should not be used as a sole basis for treatment. Nasal washings and aspirates are unacceptable for Xpert Xpress SARS-CoV-2/FLU/RSV testing.  Fact Sheet for Patients: EntrepreneurPulse.com.au  Fact Sheet for Healthcare Providers: IncredibleEmployment.be  This test is not yet approved or cleared by the [place] FDA and has been authorized for detection and/or diagnosis of SARS-CoV-2 by FDA under an Emergency Use Authorization (EUA). This EUA will remain in effect (meaning this test can be used) for the duration of the COVID-19 declaration under Section 564(b)(1) of the Act, 21 U.S.C. section 360bbb-3(b)(1), unless the authorization is terminated or revoked.     Resp  Syncytial Virus by PCR NEGATIVE NEGATIVE Final    Comment: (NOTE) Fact Sheet for Patients: EntrepreneurPulse.com.au  Fact Sheet for Healthcare Providers: IncredibleEmployment.be  This test is not yet approved or cleared  by the [place] and has been authorized for detection and/or diagnosis of SARS-CoV-2 by FDA under an Emergency Use Authorization (EUA). This EUA will remain in effect (meaning this test can be used) for the duration of the COVID-19 declaration under Section 564(b)(1) of the Act, 21 U.S.C. section 360bbb-3(b)(1), unless the authorization is terminated or revoked.  Performed at [facility], [address], [place], [place] [address]   MRSA Next Gen by PCR, Nasal     Status: Abnormal   Collection Time: 03/18/22  8:33 PM   Specimen: Nasal Mucosa; Nasal Swab  Result Value Ref Range Status   MRSA by PCR Next Gen DETECTED (A) NOT DETECTED Final    Comment: RESULT CALLED TO, READ BACK BY AND VERIFIED WITH: KING @ 0726 ON [id number] BY HENDERSON L (NOTE) The GeneXpert MRSA Assay (FDA approved for NASAL specimens only), is one component of a comprehensive MRSA colonization surveillance program. It is not intended to diagnose MRSA infection nor to guide or monitor treatment for MRSA infections. Test performance is not FDA approved in patients less than 82 years old. Performed at [facility], [address], [place], [place] [address]     Labs: CBC: Recent Labs  Lab 03/18/22 1422 03/18/22 1435 03/19/22 0439 03/20/22 0349 03/07/2022 0738  WBC 3.5*  --  7.3 10.9* 9.9  NEUTROABS 2.2  --   --   --   --   HGB 12.4 12.9 10.6* 10.2* 9.3*  HCT 38.9 38.0 32.9* 30.5* 27.6*  MCV 94.0  --  92.7 91.0 90.2  PLT 81*  --  84* 62* 55*   Basic Metabolic Panel: Recent Labs  Lab 03/18/22 1422 03/18/22 1435 03/18/22 1450 03/18/22 2318 03/19/22 0439 03/20/22 0349 03/09/2022 0441  NA 145 147*  --  144 146* 146* 144  K 3.5 4.9  --  3.6  3.4* 4.7 4.5  CL 114* 115*  --  117* 116* 118* 119*  CO2 24  --   --  19* 16* 19* 15*  GLUCOSE 118* 116*  --  169* 187* 113* 141*  BUN 40* 52*  --  36* 38* 44* 51*  CREATININE 1.26* 1.30*  --  1.30* 1.40* 2.12* 2.53*  CALCIUM 8.9  --   --  8.5* 8.7* 8.6* 8.6*  MG  --   --  1.7  --  1.6* 2.1 2.3   Liver Function Tests: Recent Labs  Lab 03/18/22 1422 03/20/22 0349 03/19/2022 0441  AST 39 30 32  ALT 49* 35 37  ALKPHOS 88 71 81  BILITOT 0.3 0.6 0.6  PROT 7.2 6.2* 5.9*  ALBUMIN 3.2* 2.7* 2.5*   CBG: Recent Labs  Lab 03/20/22 1131 03/20/22 1753 03/26/2022 0013 03/07/2022 0632 03/29/2022 1138  GLUCAP 143* 141* 148* 121* 109*    Discharge time spent: greater than 30 minutes.  Signed: Orson Eva, MD [facility] 03/22/2022

## 2022-03-22 NOTE — H&P (Signed)
History and Physical    Patient: Jackie Hickman YCX:[id number] DOB: 05-Jun-1932 DOA: 03/22/2022 DOS: the patient was seen and examined on 03/22/2022 PCP: System, Provider Not In  Patient coming from:  Hospital  Chief Complaint: End of Life Care  HPI: Jackie Hickman is 88 y.o. female with medical history significant for diabetes mellitus, hypertension alcohol history. Comes from [facility] with reports of altered mental status and hypothermia.  Per nursing home, patient was diagnosed with UTI 3 days ago and was placed on Macrobid.  At the time of my evaluation, patient is somnolent and not answering questions.   The patient's daughter [name] last saw her mom last 1 week ago and then today.  She believes alteration in mental status started at least 4 days ago when she was diagnosed with UTI and started on Macrobid.  At baseline patient has dementia, good and bad days, sometimes she recognizes family, sometimes she does not.  She ambulates with a wheelchair.  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   ED does not Course: Temperature 98.6.  Heart rate 43 improved to 60s as patient warmed up, respiratory rate 11-23.  Blood pressure systolic 497W to 263Z.  Lactic acid 2.1 > 1.9..  Sodium 145.  WBC 3.5.  UA with moderate leukocytes few bacteria.  Chest x-ray showing atelectasis, CTA chest abdomen and pelvis with contrast without significant findings except for heterogeneous and thickened endometrium. TSH -markedly elevated at 30.15. -Broad-spectrum antibiotics IV vancomycin and cefepime started.  1 L bolus LR given. ,  With maintenance at 125.   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 Pt was continued on IV abx and IV fluids.  IV synthroid and hydrocortisone was continued.  There was clinical concern for seizure.  Pt was given IV ativan and keppra without much improvement in mental status. Unfortunately, the patient did not improve clinically.  [place] discussions were held each day with family  at bedside.  They did not want patient transferred to [facility] or other tertiary care center.   We discussed the patient's overall poor prognosis given her advanced age, frailty, poor baseline function in the setting of her comorbidities and severity of her current illness.  We discussed that patient will unlikely return to her baseline premorbid function poor as that may have been.  Family expressed that patient would not have wanted to even have this type of aggressive care presently.  As patient did not improve despite optimal treatment, family expressed desire to transition patient's focus of care to focus on comfort measures. Review of Systems: Unable to review all systems due to lack of cooperation from patient. Past Medical History:  Diagnosis Date   Diabetes mellitus    Glaucoma    Gout    Hyperlipidemia    Hypertension    Left foot pain    Past Surgical History:  Procedure Laterality Date   APPENDECTOMY  1948   BACK SURGERY     fusion of L5-6   COLONOSCOPY  08/2011   Dr. Silvio Pate: 6 mm sessile polyp in cecum (tubulovillous adenoma with HGD), 25 mm sessile polyp in ascending colon (Tubular adenoma). Piecemeal polypectomy    COLONOSCOPY  08/2012   Dr. Fuller Plan: multiple sessile polyps (6-12 mm), tubular adenoma and tubulovillous adenoma, piecemeal polypectomy    COLONOSCOPY  02/2013   Dr. Fuller Plan: post-polypectomy scar in ascending colon, otherwise normal    COLONOSCOPY N/A 02/25/2016   Procedure: COLONOSCOPY;  Surgeon: [name], MD;  [facility]:  [facility];  Service: Endoscopy;  Laterality: N/A;  patient to receive phenergan 12.'5mg'$  IV on call to endo   ESOPHAGOGASTRODUODENOSCOPY  2005   Dr. Fuller Plan: gastritis    ESOPHAGOGASTRODUODENOSCOPY N/A 02/25/2016   Procedure: ESOPHAGOGASTRODUODENOSCOPY (EGD);  Surgeon: [name], MD;  Location: [facility];  Service: Endoscopy;  Laterality: N/A;  patient to receive phenergan 12.'5mg'$  IV on call to endo   EYE SURGERY     bleeding and  glaucoma   LAPAROSCOPIC CHOLECYSTECTOMY  1980   Social History:  reports that she has never smoked. She has never used smokeless tobacco. She reports current alcohol use. She reports that she does not use drugs.  No Known Allergies  Family History  Problem Relation Age of Onset   Alzheimer's disease Mother 59   Colon cancer Neg Hx    Stomach cancer Neg Hx     Prior to Admission medications   Not on File    Physical Exam: GENERAL: NAD, well developed, cooperative, does not followscommands HEENT: [place]/AT, No thrush, No icterus, No oral ulcers Neck:  No neck mass, No meningismus, soft, supple CV: RRR, no S3, no S4, no rub, no JVD Lungs: bilateral rales Abd: soft/NT +BS, nondistended Ext: Nonpitting edema, no lymphangitis, no cyanosis, no rashes   Data Reviewed: No new data  Assessment and Plan: Severe Sepsis -present on admission -presented with hypothermia, tachypnea and leukopenia -due to UTI -CT CAP--bibasilar atelectasis; RUL micronodule; heterogenous and thickened endometrium 12 mm, pericardiac atrophy, status post cholecystectomy. -Continue empiric vancomycin and cefepime pending culture data -Lactic 2.1>>1.9 -now transitioned to focus on comfort measures   UTI -UA >50 WBC -Continue vancomycin and cefepime pending culture data -now transitioned to focus on comfort measures   Acute metabolic encephalopathy -Continues to be obtunded -Multifactorial including sepsis and possible myxedema coma -repeat TSH 30.150>>9.640 -Continue antibiotics -ABG--7.4 5/27/100/18 on room air -B12--689 -Check KNLZJQB--34 -Folic LPFX--9.0 -MRI brain--no acute findings -CT head negative for acute findings -started thiamine given Etoh hx -EEG--continuous poorly-formed generalized periodic discharges at 1.5-2 Hz. This pattern is on the ictal-interictal continuum with low-to-moderate risk of seizures>>may be seen in the setting of cefepime  -discontinued cefepime -as mental status  not improved>>give ativan x 1 and load Keppra>>no improvement -family did not want transfer to tertiary care center or further aggressive care -now transitioned to focus on comfort measures   Hypothyroidism/myxedema -Continue IV Synthroid -She was given Synthroid IV 200 mcg x 1 -Repeat thyroid function studies 03/20/2022 -Continue Solu-Cortef>>wean -Follow-up cortisol>>66.2 -repeat TSH 30.150>>9.640 -now transitioned to focus on comfort measures   Acute on chronic renal failure-CKD stage IIIb -Baseline creatinine 1.2-1.4 -Monitor serial BMP -secondary to volume depletion and hemodynamic changes -care now transitioned to full comfort measures   Diabetes mellitus type 2, controlled -NovoLog sliding scale -Hemoglobin W4O--9.7 -Was on Trulicity outpatient   Thrombocytopenia -Secondary to infectious process -D53--299 -Folic MEQA--8.3 -now transitioned to comfort measures care   Essential hypertension -Holding Bystolic secondary to soft blood pressure initially   Major neurocognitive disorder -Baseline as discussed above in the history   GOC -updated family>>confirms DNR -continue care for now with understanding of transition to comfort measures if no improvement clinically -now transitioned to focus on comfort measures        Advance Care Planning: FULL COMFORT  Consults: none  Family Communication: none  Severity of Illness: The appropriate patient status for this patient is INPATIENT. Inpatient status is judged to be reasonable and necessary in order to provide the required  intensity of service to ensure the patient's safety. The patient's presenting symptoms, physical exam findings, and initial radiographic and laboratory data in the context of their chronic comorbidities is felt to place them at high risk for further clinical deterioration. Furthermore, it is not anticipated that the patient will be medically stable for discharge from the hospital within 2 midnights  of admission.   * I certify that at the point of admission it is my clinical judgment that the patient will require inpatient hospital care spanning beyond 2 midnights from the point of admission due to high intensity of service, high risk for further deterioration and high frequency of surveillance required.*  Author: Orson Eva, MD 03/22/2022 3:38 PM  For on call review [email or web address].

## 2022-03-22 NOTE — TOC Progression Note (Signed)
Transition of Care [facility]) - Progression Note    Patient Details  Name: Jackie Hickman MRN: [id number] Date of Birth: 07-17-1932  [facility] CM/SW Contact  Shade Flood, LCSW Phone Number: 03/22/2022, 11:35 AM  Clinical Narrative:     Per MD, pt comfort care and needs referral to [facility]. Referral made. There are no beds available at [facility]. RN from hospice will be at the hospital around 2pm to admit to [facility].   Updated MD and RN.  Expected Discharge Plan: Long Term Nursing Home Barriers to Discharge: Continued Medical Work up  Expected Discharge Plan and Services Expected Discharge Plan: [place] In-house Referral: Clinical Social Work   Post Acute Care Choice: Residential Hospice Bed Living arrangements for the past 2 months: [place]                                       Social Determinants of Health (SDOH) Interventions    Readmission Risk Interventions     No data to display

## 2022-03-23 DIAGNOSIS — A419 Sepsis, unspecified organism: Secondary | ICD-10-CM

## 2022-03-23 DIAGNOSIS — N179 Acute kidney failure, unspecified: Secondary | ICD-10-CM

## 2022-03-23 DIAGNOSIS — N189 Chronic kidney disease, unspecified: Secondary | ICD-10-CM

## 2022-03-23 DIAGNOSIS — R6520 Severe sepsis without septic shock: Secondary | ICD-10-CM

## 2022-03-23 DIAGNOSIS — E035 Myxedema coma: Secondary | ICD-10-CM

## 2022-03-23 LAB — CULTURE, BLOOD (ROUTINE X 2)
Culture: NO GROWTH
Culture: NO GROWTH
Special Requests: ADEQUATE

## 2022-03-23 MED ORDER — MORPHINE SULFATE (CONCENTRATE) 10 MG/0.5ML PO SOLN
10.0000 mg | ORAL | Status: DC | PRN
  Administered 2022-03-23 (×2): 10 mg via ORAL
  Filled 2022-03-23 (×2): qty 0.5

## 2022-03-23 NOTE — TOC Transition Note (Signed)
Transition of Care [facility]) - CM/SW Discharge Note   Patient Details  Name: Jackie Hickman MRN: [id number] Date of Birth: Jul 01, 1932  [facility]) CM/SW Contact:  Shade Flood, LCSW Phone Number: 03/23/2022, 11:49 AM   Clinical Narrative:     Received call from [facility] stating they have residential hospice bed for pt. Family aware and in agreement with transfer.   Updated MD and RN. Rn to call report. Hospice staff arranging EMS.  No other TOC needs for dc.  Final next level of care: [place] Barriers to Discharge: Barriers Resolved   Patient Goals and CMS Choice        Discharge Placement                       Discharge Plan and Services                                     Social Determinants of Health (SDOH) Interventions     Readmission Risk Interventions     No data to display

## 2022-03-23 NOTE — Discharge Summary (Signed)
Physician Discharge Summary   Patient: Jackie Hickman MRN: [id number] DOB: 10/19/1932  Admit date:     03/22/2022  Discharge date: 03/23/22  Discharge Physician: Shanon Brow Annabel Gibeau   PCP: System, Provider Not In   [place] [facility] Course: 88 y.o. female with medical history significant for diabetes mellitus, hypertension alcohol history. Comes from [facility] with reports of altered mental status and hypothermia.  Per nursing home, patient was diagnosed with UTI 3 days ago and was placed on Macrobid.  At the time of my evaluation, patient is somnolent and not answering questions.   The patient's daughter [name] last saw her mom last 1 week ago and then today.  She believes alteration in mental status started at least 4 days ago when she was diagnosed with UTI and started on Macrobid.  At baseline patient has dementia, good and bad days, sometimes she recognizes family, sometimes she does not.  She ambulates with a wheelchair.  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   ED does not Course: Temperature 98.6.  Heart rate 43 improved to 60s as patient warmed up, respiratory rate 11-23.  Blood pressure systolic 382N to 053Z.  Lactic acid 2.1 > 1.9..  Sodium 145.  WBC 3.5.  UA with moderate leukocytes few bacteria.  Chest x-ray showing atelectasis, CTA chest abdomen and pelvis with contrast without significant findings except for heterogeneous and thickened endometrium. TSH -markedly elevated at 30.15. -Broad-spectrum antibiotics IV vancomycin and cefepime started.  1 L bolus LR given. ,  With maintenance at 125.   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 Pt was continued on IV abx and IV fluids.  IV synthroid and hydrocortisone was continued.  There was clinical concern for seizure.  Pt was given IV ativan and keppra without much improvement in mental status. Unfortunately, the patient did not improve clinically.  [place] discussions were held each day with family at bedside.   They did not want patient transferred to [facility] or other tertiary care center.   We discussed the patient's overall poor prognosis given her advanced age, frailty, poor baseline function in the setting of her comorbidities and severity of her current illness.  We discussed that patient will unlikely return to her baseline premorbid function poor as that may have been.  Family expressed that patient would not have wanted to even have this type of aggressive care presently.  As patient did not improve despite optimal treatment, family expressed desire to transition patient's focus of care to focus on comfort measures. The patient was transitioned to GIP status with continued comfort care measures.  Ultimately, there was bed available at [facility].  The patient was stable for transfer.  Assessment and Plan: Severe Sepsis -present on admission -presented with hypothermia, tachypnea and leukopenia -due to UTI -CT CAP--bibasilar atelectasis; RUL micronodule; heterogenous and thickened endometrium 12 mm, pericardiac atrophy, status post cholecystectomy. -Continue empiric vancomycin and cefepime pending culture data -Lactic 2.1>>1.9 -now transitioned to focus on comfort measures   UTI -UA >50 WBC -Continue vancomycin and cefepime pending culture data -now transitioned to focus on comfort measures   Acute metabolic encephalopathy -Continues to be obtunded -Multifactorial including sepsis and possible myxedema coma -repeat TSH 30.150>>9.640 -Continue antibiotics -ABG--7.4 5/27/100/18 on room air -B12--689 -Check JQBHALP--37 -Folic TKWI--0.9 -MRI brain--no acute findings -CT head negative for acute findings -started thiamine given Etoh hx -EEG--continuous poorly-formed generalized periodic discharges at 1.5-2 Hz. This pattern is on the ictal-interictal continuum with low-to-moderate  risk of seizures>>may be seen in the setting of cefepime  -discontinued cefepime -as mental status not  improved>>give ativan x 1 and load Keppra>>no improvement -family did not want transfer to tertiary care center or further aggressive care -now transitioned to focus on comfort measures   Hypothyroidism/myxedema -Continue IV Synthroid -She was given Synthroid IV 200 mcg x 1 -Repeat thyroid function studies 03/20/2022 -Continue Solu-Cortef>>wean -Follow-up cortisol>>66.2 -repeat TSH 30.150>>9.640 -now transitioned to focus on comfort measures   Acute on chronic renal failure-CKD stage IIIb -Baseline creatinine 1.2-1.4 -Monitor serial BMP -secondary to volume depletion and hemodynamic changes -care now transitioned to full comfort measures   Diabetes mellitus type 2, controlled -NovoLog sliding scale -Hemoglobin X4G--8.1 -Was on Trulicity outpatient   Thrombocytopenia -Secondary to infectious process -E56--314 -Folic HFWY--6.3 -now transitioned to comfort measures care   Essential hypertension -Holding Bystolic secondary to soft blood pressure initially   Major neurocognitive disorder -Baseline as discussed above in the history   GOC -updated family>>confirms DNR -continue care for now with understanding of transition to comfort measures if no improvement clinically -now transitioned to focus on comfort measures            Pain control - [facility] Controlled Substance Reporting System database was reviewed. and patient was instructed, not to drive, operate heavy machinery, perform activities at heights, swimming or participation in water activities or provide baby-sitting services while on Pain, Sleep and Anxiety Medications; until their outpatient Physician has advised to do so again. Also recommended to not to take more than prescribed Pain, Sleep and Anxiety Medications.  Consultants: none Procedures performed: none  Disposition: Hospice care Diet recommendation:  Comfort Feeds DISCHARGE MEDICATION: Allergies as of 03/23/2022   No Known Allergies       Medication List    You have not been prescribed any medications.     Discharge Exam: HEENT:  [place]/AT, No thrush, no icterus CV:  RRR, no rub, no S3, no S4 Lung:  bibasilar rales. No wheeze Abd:  soft/+BS, NT Ext:  1 + LE edema, no lymphangitis, no synovitis, no rash   Condition at discharge: stable  The results of significant diagnostics from this hospitalization (including imaging, microbiology, ancillary and laboratory) are listed below for reference.   Imaging Studies: MR BRAIN WO CONTRAST  Result Date: 03/19/2022 CLINICAL DATA:  Mental status change, persistent or worsening. Right gaze preference EXAM: MRI HEAD WITHOUT CONTRAST TECHNIQUE: Multiplanar, multiecho pulse sequences of the brain and surrounding structures were obtained without intravenous contrast. COMPARISON:  Head CT from yesterday FINDINGS: Brain: No acute infarction, hemorrhage, hydrocephalus, extra-axial collection or mass lesion. Generalized brain atrophy, particularly pronounced in the medial temporal lobes where there is marked sulcal widening and some FLAIR hyperintensity unusual streaky T2 hyperintensity in the ventral medulla, without swelling or clear volume loss, chronic appearing. Finally, there is atypical T2 hyperintensity peripheral to the bilateral putamen. Mild chronic small vessel ischemic type change in the periventricular white matter. Vascular: Major flow voids are preserved Skull and upper cervical spine: No focal marrow lesion Sinuses/Orbits: Bilateral cataract resection IMPRESSION: No acute finding including infarct. Unusual pattern of signal abnormality in the medulla, putamen region, and medial temporal lobes with a background of generalized brain atrophy, question multi system atrophy. Electronically Signed   By: Jorje Guild M.D.   On: 03/19/2022 13:04   CT Head Wo Contrast  Result Date: 03/18/2022 CLINICAL DATA:  [place] EXAM: CT HEAD WITHOUT CONTRAST TECHNIQUE: Contiguous axial images were  obtained from the base  of the skull through the vertex without intravenous contrast. RADIATION DOSE REDUCTION: This exam was performed according to the departmental dose-optimization program which includes automated exposure control, adjustment of the mA and/or kV according to patient size and/or use of iterative reconstruction technique. COMPARISON:  CT head 05/23/2010 report without imaging BRAIN: BRAIN Cerebral ventricle sizes are concordant with the degree of cerebral volume loss. Patchy and confluent areas of decreased attenuation are noted throughout the deep and periventricular white matter of the cerebral hemispheres bilaterally, compatible with chronic microvascular ischemic disease. No evidence of large-territorial acute infarction. No parenchymal hemorrhage. No mass lesion. No extra-axial collection. No mass effect or midline shift. No hydrocephalus. Basilar cisterns are patent. Vascular: No hyperdense vessel. Atherosclerotic calcifications are present within the cavernous internal carotid arteries. Skull: No acute fracture or focal lesion. Sinuses/Orbits: Left maxillary sinus mucosal thickening. Otherwise paranasal sinuses and mastoid air cells are clear. Bilateral lens replacement. Left scleral buckle. Otherwise the orbits are unremarkable. Other: None. IMPRESSION: No acute intracranial abnormality. Electronically Signed   By: Iven Finn M.D.   On: 03/18/2022 18:07   CT CHEST ABDOMEN PELVIS W CONTRAST  Result Date: 03/18/2022 CLINICAL DATA:  Sepsis EXAM: CT CHEST, ABDOMEN, AND PELVIS WITH CONTRAST TECHNIQUE: Multidetector CT imaging of the chest, abdomen and pelvis was performed following the standard protocol during bolus administration of intravenous contrast. RADIATION DOSE REDUCTION: This exam was performed according to the departmental dose-optimization program which includes automated exposure control, adjustment of the mA and/or kV according to patient size and/or use of iterative  reconstruction technique. CONTRAST:  75m OMNIPAQUE IOHEXOL 300 MG/ML  SOLN COMPARISON:  None Available. FINDINGS: CT CHEST FINDINGS Cardiovascular: Normal heart size. No significant pericardial effusion. The thoracic aorta is normal in caliber. Severe atherosclerotic plaque of the thoracic aorta. At least 2 vessel coronary artery calcifications. The main pulmonary artery is normal in caliber. No central pulmonary embolus. Mediastinum/Nodes: No enlarged mediastinal, hilar, or axillary lymph nodes. Heterogeneous partially calcified thyroid glands. Trachea and esophagus demonstrate no significant findings. Lungs/Pleura: Expiratory phase of respiration. Bilateral lower lobe atelectasis. No focal consolidation. Subpleural right upper lobe micronodule (4:62, 5:85). No pulmonary mass. No pleural effusion. No pneumothorax. Musculoskeletal: No chest wall abnormality. No suspicious lytic or blastic osseous lesions. No acute displaced fracture. CT ABDOMEN PELVIS FINDINGS Hepatobiliary: No focal liver abnormality. Status post cholecystectomy. No intrahepatic biliary dilatation. Mild extrahepatic biliary ductal dilatation which can be seen in the post cholecystectomy setting. Pancreas: Diffusely atrophic. No focal lesion. Otherwise normal pancreatic contour. No surrounding inflammatory changes. No main pancreatic ductal dilatation. Spleen: Normal in size without focal abnormality. Adrenals/Urinary Tract: No adrenal nodule bilaterally. Bilateral kidneys enhance symmetrically. No hydronephrosis. No hydroureter. The urinary bladder is unremarkable. Foley catheter tip and balloon terminate within the urinary bladder lumen. On delayed imaging, there is no urothelial wall thickening and there are no filling defects in the opacified portions of the bilateral collecting systems or ureters. Stomach/Bowel: Stomach is within normal limits. No evidence of bowel wall thickening or dilatation. Appendix appears normal. Vascular/Lymphatic: No  abdominal aorta or iliac aneurysm. Severe atherosclerotic plaque of the aorta and its branches. No abdominal, pelvic, or inguinal lymphadenopathy. Reproductive: Pericentimeter intramural calcification likely representative of a degenerative uterine fibroid. Heterogeneous in thickened (12 mm). Endometrium. Otherwise uterus and bilateral adnexa are unremarkable. Other: No intraperitoneal free fluid. No intraperitoneal free gas. No organized fluid collection. Musculoskeletal: No abdominal wall hernia or abnormality. Trace subcutaneus soft tissue emphysema along the anterior lower abdominal wall suggestive of medication  injections (2:80). No suspicious lytic or blastic osseous lesions. No acute displaced fracture. Grade 1 anterolisthesis of L4 on L5. Intervertebral disc space vacuum phenomenon at the L4-L5 and L5-S1 levels. Posterior disc osteophyte complex formation at the L5 on S1 level. IMPRESSION: 1. Heterogeneous and thickened endometrium. Recommend pelvic ultrasound for further evaluation. 2. Heterogeneous partially calcified thyroid glands. In the setting of significant comorbidities or limited life expectancy, no follow-up recommended (ref: J Am Coll Radiol. 2015 Feb;12(2): 143-50). 3. Subpleural right upper lobe micronodule. No follow-up needed if patient is low-risk.This recommendation follows the consensus statement: Guidelines for Management of Incidental Pulmonary Nodules Detected on CT Images: From the Fleischner Society 2017; Radiology 2017; 284:228-243. 4. Trace subcutaneus soft tissue emphysema along the anterior lower abdominal wall suggestive of medication injections. Electronically Signed   By: Iven Finn M.D.   On: 03/18/2022 18:03   DG Chest Port 1 View  Result Date: 03/18/2022 CLINICAL DATA:  Questionable sepsis in an 88 year old female. EXAM: PORTABLE CHEST 1 VIEW COMPARISON:  None available. FINDINGS: EKG leads project over the chest. Image rotated slightly to the RIGHT.  Cardiomediastinal contours and hilar structures unremarkable to the extent evaluated on this nonstandard AP projection accounting for rotation. No lobar level consolidation. Scattered airspace opacities throughout the chest mainly with linear appearance. No pneumothorax. On limited assessment there is no acute skeletal process. IMPRESSION: Scattered airspace opacities throughout the chest mainly with linear appearance. Findings favor scattered atelectasis. Difficult to exclude infection at the lung bases. Electronically Signed   By: Zetta Bills M.D.   On: 03/18/2022 15:22    Microbiology: Results for orders placed or performed during the hospital encounter of 03/18/22  Blood Culture (routine x 2)     Status: None (Preliminary result)   Collection Time: 03/18/22  2:29 PM   Specimen: BLOOD  Result Value Ref Range Status   Specimen Description BLOOD LEFT ASSIST CONTROL  Final   Special Requests   Final    BOTTLES DRAWN AEROBIC AND ANAEROBIC Blood Culture adequate volume   Culture   Final    NO GROWTH 4 DAYS Performed at [facility], [address], [place], [place] [address]    Report Status PENDING  Incomplete  Blood Culture (routine x 2)     Status: None (Preliminary result)   Collection Time: 03/18/22  2:30 PM   Specimen: BLOOD  Result Value Ref Range Status   Specimen Description BLOOD RIGHT ARM  Final   Special Requests   Final    BOTTLES DRAWN AEROBIC AND ANAEROBIC Blood Culture results may not be optimal due to an inadequate volume of blood received in culture bottles   Culture   Final    NO GROWTH 4 DAYS Performed at [facility], [address], [place], [place] [address]    Report Status PENDING  Incomplete  Urine Culture     Status: Abnormal   Collection Time: 03/18/22  2:30 PM   Specimen: Urine, Catheterized  Result Value Ref Range Status   Specimen Description   Final    URINE, CATHETERIZED Performed at [facility], [address] [place], [place] [address]    Special  Requests   Final    NONE Performed at [facility], [address], [place], [place] [address]    Culture (A)  Final    20,000 COLONIES/mL STREPTOCOCCUS AGALACTIAE TESTING AGAINST S. AGALACTIAE NOT ROUTINELY PERFORMED DUE TO PREDICTABILITY OF AMP/PEN/VAN SUSCEPTIBILITY. AMONG MIXED ORGANISMS Performed at [facility], [place]  [address] [place], [place] [address]    Report Status 03/20/2022 FINAL  Final  Resp panel by RT-PCR (RSV, Flu A&B, Covid) Anterior Nasal Swab     Status: None   Collection Time: 03/18/22  6:18 PM   Specimen: Anterior Nasal Swab  Result Value Ref Range Status   SARS Coronavirus 2 by RT PCR NEGATIVE NEGATIVE Final    Comment: (NOTE) SARS-CoV-2 target nucleic acids are NOT DETECTED.  The SARS-CoV-2 RNA is generally detectable in upper respiratory specimens during the acute phase of infection. The lowest concentration of SARS-CoV-2 viral copies this assay can detect is 138 copies/mL. A negative result does not preclude SARS-Cov-2 infection and should not be used as the sole basis for treatment or other patient management decisions. A negative result may occur with  improper specimen collection/handling, submission of specimen other than nasopharyngeal swab, presence of viral mutation(s) within the areas targeted by this assay, and inadequate number of viral copies(<138 copies/mL). A negative result must be combined with clinical observations, patient history, and epidemiological information. The expected result is Negative.  Fact Sheet for Patients:  EntrepreneurPulse.com.au  Fact Sheet for Healthcare Providers:  IncredibleEmployment.be  This test is no t yet approved or cleared by the [place] FDA and  has been authorized for detection and/or diagnosis of SARS-CoV-2 by FDA under an Emergency Use Authorization (EUA). This EUA will remain  in effect (meaning this test can be used) for the duration of the COVID-19  declaration under Section 564(b)(1) of the Act, 21 U.S.C.section 360bbb-3(b)(1), unless the authorization is terminated  or revoked sooner.       Influenza A by PCR NEGATIVE NEGATIVE Final   Influenza B by PCR NEGATIVE NEGATIVE Final    Comment: (NOTE) The Xpert Xpress SARS-CoV-2/FLU/RSV plus assay is intended as an aid in the diagnosis of influenza from Nasopharyngeal swab specimens and should not be used as a sole basis for treatment. Nasal washings and aspirates are unacceptable for Xpert Xpress SARS-CoV-2/FLU/RSV testing.  Fact Sheet for Patients: EntrepreneurPulse.com.au  Fact Sheet for Healthcare Providers: IncredibleEmployment.be  This test is not yet approved or cleared by the [place] FDA and has been authorized for detection and/or diagnosis of SARS-CoV-2 by FDA under an Emergency Use Authorization (EUA). This EUA will remain in effect (meaning this test can be used) for the duration of the COVID-19 declaration under Section 564(b)(1) of the Act, 21 U.S.C. section 360bbb-3(b)(1), unless the authorization is terminated or revoked.     Resp Syncytial Virus by PCR NEGATIVE NEGATIVE Final    Comment: (NOTE) Fact Sheet for Patients: EntrepreneurPulse.com.au  Fact Sheet for Healthcare Providers: IncredibleEmployment.be  This test is not yet approved or cleared by the [place] FDA and has been authorized for detection and/or diagnosis of SARS-CoV-2 by FDA under an Emergency Use Authorization (EUA). This EUA will remain in effect (meaning this test can be used) for the duration of the COVID-19 declaration under Section 564(b)(1) of the Act, 21 U.S.C. section 360bbb-3(b)(1), unless the authorization is terminated or revoked.  Performed at [facility], [address], [place],  [address]   MRSA Next Gen by PCR, Nasal     Status: Abnormal   Collection Time: 03/18/22  8:33 PM    Specimen: Nasal Mucosa; Nasal Swab  Result Value Ref Range Status   MRSA by PCR Next Gen DETECTED (A) NOT DETECTED Final    Comment: RESULT CALLED TO, READ BACK BY AND VERIFIED WITH: KING @ 0726 ON [id number] BY HENDERSON L (NOTE)  The GeneXpert MRSA Assay (FDA approved for NASAL specimens only), is one component of a comprehensive MRSA colonization surveillance program. It is not intended to diagnose MRSA infection nor to guide or monitor treatment for MRSA infections. Test performance is not FDA approved in patients less than 35 years old. Performed at [facility], [address], [place], [place] [address]     Labs: CBC: Recent Labs  Lab 03/18/22 1422 03/18/22 1435 03/19/22 0439 03/20/22 0349 03/26/2022 0738  WBC 3.5*  --  7.3 10.9* 9.9  NEUTROABS 2.2  --   --   --   --   HGB 12.4 12.9 10.6* 10.2* 9.3*  HCT 38.9 38.0 32.9* 30.5* 27.6*  MCV 94.0  --  92.7 91.0 90.2  PLT 81*  --  84* 62* 55*   Basic Metabolic Panel: Recent Labs  Lab 03/18/22 1422 03/18/22 1435 03/18/22 1450 03/18/22 2318 03/19/22 0439 03/20/22 0349 03/24/2022 0441  NA 145 147*  --  144 146* 146* 144  K 3.5 4.9  --  3.6 3.4* 4.7 4.5  CL 114* 115*  --  117* 116* 118* 119*  CO2 24  --   --  19* 16* 19* 15*  GLUCOSE 118* 116*  --  169* 187* 113* 141*  BUN 40* 52*  --  36* 38* 44* 51*  CREATININE 1.26* 1.30*  --  1.30* 1.40* 2.12* 2.53*  CALCIUM 8.9  --   --  8.5* 8.7* 8.6* 8.6*  MG  --   --  1.7  --  1.6* 2.1 2.3   Liver Function Tests: Recent Labs  Lab 03/18/22 1422 03/20/22 0349 03/22/2022 0441  AST 39 30 32  ALT 49* 35 37  ALKPHOS 88 71 81  BILITOT 0.3 0.6 0.6  PROT 7.2 6.2* 5.9*  ALBUMIN 3.2* 2.7* 2.5*   CBG: Recent Labs  Lab 03/20/22 1131 03/20/22 1753 03/24/2022 0013 03/15/2022 0632 03/18/2022 1138  GLUCAP 143* 141* 148* 121* 109*    Discharge time spent: less than 30 minutes.  Signed: Orson Eva, MD [facility] 03/23/2022

## 2022-04-05 NOTE — Death Summary Note (Incomplete)
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 female with medical history significant for diabetes mellitus, hypertension alcohol history. Comes from [facility] with reports of altered mental status and hypothermia.  Per nursing home, patient was diagnosed with UTI 3 days ago and was placed on Macrobid.  At the time of my evaluation, patient is somnolent and not answering questions.   The patient's daughter Jackie Hickman last saw her mom last 1 week ago and then today.  She believes alteration in mental status started at least 4 days ago when she was diagnosed with UTI and started on Macrobid.  At baseline patient has dementia, good and bad days, sometimes she recognizes family, sometimes she does not.  She ambulates with a wheelchair.  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   ED does not Course: Temperature 98.6.  Heart rate 43 improved to 60s as patient warmed up, respiratory rate 11-23.  Blood pressure systolic 301S to 010X.  Lactic acid 2.1 > 1.9..  Sodium 145.  WBC 3.5.  UA with moderate leukocytes few bacteria.  Chest x-ray showing atelectasis, CTA chest abdomen and pelvis with contrast without significant findings except for heterogeneous and thickened endometrium. TSH -markedly elevated at 30.15. -Broad-spectrum antibiotics IV vancomycin and cefepime started.  1 L bolus LR given. ,  With maintenance at 125.    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 Pt was continued on IV abx and IV fluids.  IV synthroid and hydrocortisone was continued.  There was clinical concern for seizure.  Pt was given IV ativan and keppra without much improvement in mental status. Unfortunately, the patient did not improve clinically.  Story discussions were held each day with family at bedside.  They did not want patient transferred to [facility] or other tertiary care center.   We discussed the patient's overall poor prognosis given her advanced age, frailty, poor baseline function in the setting of her comorbidities and severity of her current illness.  We discussed that patient will unlikely return to her baseline premorbid function poor as that may have been.  Family expressed that patient would not have wanted to even have this type of aggressive care presently.  As patient did not improve despite optimal treatment, family expressed desire to transition patient's focus of care to focus on comfort measures.   Assessment and Plan: Severe Sepsis -present on admission -presented with hypothermia, tachypnea and leukopenia -due to UTI -CT CAP--bibasilar atelectasis; RUL micronodule; heterogenous and thickened endometrium 12 mm, pericardiac atrophy, status post cholecystectomy. -Continue empiric vancomycin and cefepime pending culture data -Lactic 2.1>>1.9 -now transitioned to focus on comfort measures   UTI -UA >50 WBC -Continue vancomycin and cefepime pending culture data -now transitioned to focus on comfort measures   Acute metabolic encephalopathy -Continues to be obtunded -Multifactorial including sepsis and possible myxedema coma -repeat TSH 30.150>>9.640 -Continue antibiotics -ABG--7.4 5/27/100/18 on room  air -B12--689 -Check ULAGTXM--46 -Folic OEHO--1.2 -MRI brain--no acute findings -CT head negative for acute findings -started thiamine given Etoh hx -EEG--continuous poorly-formed generalized periodic  discharges at 1.5-2 Hz. This pattern is on the ictal-interictal continuum with low-to-moderate risk of seizures>>may be seen in the setting of cefepime  -discontinued cefepime -as mental status not improved>>give ativan x 1 and load Keppra>>no improvement -family did not want transfer to tertiary care center or further aggressive care -now transitioned to focus on comfort measures   Hypothyroidism/myxedema -Continue IV Synthroid -She was given Synthroid IV 200 mcg x 1 -Repeat thyroid function studies 03/20/2022 -Continue Solu-Cortef>>wean -Follow-up cortisol>>66.2 -repeat TSH 30.150>>9.640 -now transitioned to focus on comfort measures   Acute on chronic renal failure-CKD stage IIIb -Baseline creatinine 1.2-1.4 -Monitor serial BMP -secondary to volume depletion and hemodynamic changes -care now transitioned to full comfort measures   Diabetes mellitus type 2, controlled -NovoLog sliding scale -Hemoglobin Y4M--2.5 -Was on Trulicity outpatient   Thrombocytopenia -Secondary to infectious process -O03--704 -Folic UGQB--1.6 -now transitioned to comfort measures care   Essential hypertension -Holding Bystolic secondary to soft blood pressure initially   Major neurocognitive disorder -Baseline as discussed above in the history   GOC -updated family>>confirms DNR -continue care for now with understanding of transition to comfort measures if no improvement clinically -now transitioned to focus on comfort measures      {Tip this will not be part of the note when signed Body mass index is 32.34 kg/m. , ,  (Optional):26781}   Procedures: none  Consultations: none  The results of significant diagnostics from this hospitalization (including imaging, microbiology, ancillary and laboratory) are listed below for reference.   Significant Diagnostic Studies: MR BRAIN WO CONTRAST  Result Date: 03/19/2022 CLINICAL DATA:  Mental status change, persistent or worsening. Right gaze  preference EXAM: MRI HEAD WITHOUT CONTRAST TECHNIQUE: Multiplanar, multiecho pulse sequences of the brain and surrounding structures were obtained without intravenous contrast. COMPARISON:  Head CT from yesterday FINDINGS: Brain: No acute infarction, hemorrhage, hydrocephalus, extra-axial collection or mass lesion. Generalized brain atrophy, particularly pronounced in the medial temporal lobes where there is marked sulcal widening and some FLAIR hyperintensity unusual streaky T2 hyperintensity in the ventral medulla, without swelling or clear volume loss, chronic appearing. Finally, there is atypical T2 hyperintensity peripheral to the bilateral putamen. Mild chronic small vessel ischemic type change in the periventricular white matter. Vascular: Major flow voids are preserved Skull and upper cervical spine: No focal marrow lesion Sinuses/Orbits: Bilateral cataract resection IMPRESSION: No acute finding including infarct. Unusual pattern of signal abnormality in the medulla, putamen region, and medial temporal lobes with a background of generalized brain atrophy, question multi system atrophy. Electronically Signed   By: Jorje Guild M.D.   On: 03/19/2022 13:04   CT Head Wo Contrast  Result Date: 03/18/2022 CLINICAL DATA:  Delirium EXAM: CT HEAD WITHOUT CONTRAST TECHNIQUE: Contiguous axial images were obtained from the base of the skull through the vertex without intravenous contrast. RADIATION DOSE REDUCTION: This exam was performed according to the departmental dose-optimization program which includes automated exposure control, adjustment of the mA and/or kV according to patient size and/or use of iterative reconstruction technique. COMPARISON:  CT head 05/23/2010 report without imaging BRAIN: BRAIN Cerebral ventricle sizes are concordant with the degree of cerebral volume loss. Patchy and confluent areas of decreased attenuation are noted throughout the deep and periventricular white matter of the  cerebral hemispheres bilaterally, compatible with chronic microvascular ischemic disease. No evidence of large-territorial acute infarction. No  parenchymal hemorrhage. No mass lesion. No extra-axial collection. No mass effect or midline shift. No hydrocephalus. Basilar cisterns are patent. Vascular: No hyperdense vessel. Atherosclerotic calcifications are present within the cavernous internal carotid arteries. Skull: No acute fracture or focal lesion. Sinuses/Orbits: Left maxillary sinus mucosal thickening. Otherwise paranasal sinuses and mastoid air cells are clear. Bilateral lens replacement. Left scleral buckle. Otherwise the orbits are unremarkable. Other: None. IMPRESSION: No acute intracranial abnormality. Electronically Signed   By: Iven Finn M.D.   On: 03/18/2022 18:07   CT CHEST ABDOMEN PELVIS W CONTRAST  Result Date: 03/18/2022 CLINICAL DATA:  Sepsis EXAM: CT CHEST, ABDOMEN, AND PELVIS WITH CONTRAST TECHNIQUE: Multidetector CT imaging of the chest, abdomen and pelvis was performed following the standard protocol during bolus administration of intravenous contrast. RADIATION DOSE REDUCTION: This exam was performed according to the departmental dose-optimization program which includes automated exposure control, adjustment of the mA and/or kV according to patient size and/or use of iterative reconstruction technique. CONTRAST:  2m OMNIPAQUE IOHEXOL 300 MG/ML  SOLN COMPARISON:  None Available. FINDINGS: CT CHEST FINDINGS Cardiovascular: Normal heart size. No significant pericardial effusion. The thoracic aorta is normal in caliber. Severe atherosclerotic plaque of the thoracic aorta. At least 2 vessel coronary artery calcifications. The main pulmonary artery is normal in caliber. No central pulmonary embolus. Mediastinum/Nodes: No enlarged mediastinal, hilar, or axillary lymph nodes. Heterogeneous partially calcified thyroid glands. Trachea and esophagus demonstrate no significant findings.  Lungs/Pleura: Expiratory phase of respiration. Bilateral lower lobe atelectasis. No focal consolidation. Subpleural right upper lobe micronodule (4:62, 5:85). No pulmonary mass. No pleural effusion. No pneumothorax. Musculoskeletal: No chest wall abnormality. No suspicious lytic or blastic osseous lesions. No acute displaced fracture. CT ABDOMEN PELVIS FINDINGS Hepatobiliary: No focal liver abnormality. Status post cholecystectomy. No intrahepatic biliary dilatation. Mild extrahepatic biliary ductal dilatation which can be seen in the post cholecystectomy setting. Pancreas: Diffusely atrophic. No focal lesion. Otherwise normal pancreatic contour. No surrounding inflammatory changes. No main pancreatic ductal dilatation. Spleen: Normal in size without focal abnormality. Adrenals/Urinary Tract: No adrenal nodule bilaterally. Bilateral kidneys enhance symmetrically. No hydronephrosis. No hydroureter. The urinary bladder is unremarkable. Foley catheter tip and balloon terminate within the urinary bladder lumen. On delayed imaging, there is no urothelial wall thickening and there are no filling defects in the opacified portions of the bilateral collecting systems or ureters. Stomach/Bowel: Stomach is within normal limits. No evidence of bowel wall thickening or dilatation. Appendix appears normal. Vascular/Lymphatic: No abdominal aorta or iliac aneurysm. Severe atherosclerotic plaque of the aorta and its branches. No abdominal, pelvic, or inguinal lymphadenopathy. Reproductive: Pericentimeter intramural calcification likely representative of a degenerative uterine fibroid. Heterogeneous in thickened (12 mm). Endometrium. Otherwise uterus and bilateral adnexa are unremarkable. Other: No intraperitoneal free fluid. No intraperitoneal free gas. No organized fluid collection. Musculoskeletal: No abdominal wall hernia or abnormality. Trace subcutaneus soft tissue emphysema along the anterior lower abdominal wall suggestive of  medication injections (2:80). No suspicious lytic or blastic osseous lesions. No acute displaced fracture. Grade 1 anterolisthesis of L4 on L5. Intervertebral disc space vacuum phenomenon at the L4-L5 and L5-S1 levels. Posterior disc osteophyte complex formation at the L5 on S1 level. IMPRESSION: 1. Heterogeneous and thickened endometrium. Recommend pelvic ultrasound for further evaluation. 2. Heterogeneous partially calcified thyroid glands. In the setting of significant comorbidities or limited life expectancy, no follow-up recommended (ref: J Am Coll Radiol. 2015 Feb;12(2): 143-50). 3. Subpleural right upper lobe micronodule. No follow-up needed if patient is low-risk.This recommendation follows the consensus statement:  Guidelines for Management of Incidental Pulmonary Nodules Detected on CT Images: From the Fleischner Society 2017; Radiology 2017; 284:228-243. 4. Trace subcutaneus soft tissue emphysema along the anterior lower abdominal wall suggestive of medication injections. Electronically Signed   By: Iven Finn M.D.   On: 03/18/2022 18:03   DG Chest Port 1 View  Result Date: 03/18/2022 CLINICAL DATA:  Questionable sepsis in an 89 year old female. EXAM: PORTABLE CHEST 1 VIEW COMPARISON:  None available. FINDINGS: EKG leads project over the chest. Image rotated slightly to the RIGHT. Cardiomediastinal contours and hilar structures unremarkable to the extent evaluated on this nonstandard AP projection accounting for rotation. No lobar level consolidation. Scattered airspace opacities throughout the chest mainly with linear appearance. No pneumothorax. On limited assessment there is no acute skeletal process. IMPRESSION: Scattered airspace opacities throughout the chest mainly with linear appearance. Findings favor scattered atelectasis. Difficult to exclude infection at the lung bases. Electronically Signed   By: Zetta Bills M.D.   On: 03/18/2022 15:22    Microbiology: Recent Results (from the  past 240 hour(s))  Blood Culture (routine x 2)     Status: None (Preliminary result)   Collection Time: 03/18/22  2:29 PM   Specimen: BLOOD  Result Value Ref Range Status   Specimen Description BLOOD LEFT ASSIST CONTROL  Final   Special Requests   Final    BOTTLES DRAWN AEROBIC AND ANAEROBIC Blood Culture adequate volume   Culture   Final    NO GROWTH 4 DAYS Performed at [facility], [address], [place], [place] [address]    Report Status PENDING  Incomplete  Blood Culture (routine x 2)     Status: None (Preliminary result)   Collection Time: 03/18/22  2:30 PM   Specimen: BLOOD  Result Value Ref Range Status   Specimen Description BLOOD RIGHT ARM  Final   Special Requests   Final    BOTTLES DRAWN AEROBIC AND ANAEROBIC Blood Culture results may not be optimal due to an inadequate volume of blood received in culture bottles   Culture   Final    NO GROWTH 4 DAYS Performed at [facility], [address], [place], [place] [address]    Report Status PENDING  Incomplete  Urine Culture     Status: Abnormal   Collection Time: 03/18/22  2:30 PM   Specimen: Urine, Catheterized  Result Value Ref Range Status   Specimen Description   Final    URINE, CATHETERIZED Performed at [facility], [address], [place], [place] [address]    Special Requests   Final    NONE Performed at [facility], [address], [place], [place] [address]    Culture (A)  Final    20,000 COLONIES/mL STREPTOCOCCUS AGALACTIAE TESTING AGAINST S. AGALACTIAE NOT ROUTINELY PERFORMED DUE TO PREDICTABILITY OF AMP/PEN/VAN SUSCEPTIBILITY. AMONG MIXED ORGANISMS Performed at [facility], [place] [address], [place],  [address]    Report Status 03/20/2022 FINAL  Final  Resp panel by RT-PCR (RSV, Flu A&B, Covid) Anterior Nasal Swab     Status: None   Collection Time: 03/18/22  6:18 PM   Specimen: Anterior Nasal Swab  Result Value Ref Range Status   SARS Coronavirus 2 by RT PCR NEGATIVE NEGATIVE Final     Comment: (NOTE) SARS-CoV-2 target nucleic acids are NOT DETECTED.  The SARS-CoV-2 RNA is generally detectable in upper respiratory specimens during the acute phase of infection. The lowest concentration of SARS-CoV-2 viral copies this assay can detect is 138  copies/mL. A negative result does not preclude SARS-Cov-2 infection and should not be used as the sole basis for treatment or other patient management decisions. A negative result may occur with  improper specimen collection/handling, submission of specimen other than nasopharyngeal swab, presence of viral mutation(s) within the areas targeted by this assay, and inadequate number of viral copies(<138 copies/mL). A negative result must be combined with clinical observations, patient history, and epidemiological information. The expected result is Negative.  Fact Sheet for Patients:  EntrepreneurPulse.com.au  Fact Sheet for Healthcare Providers:  IncredibleEmployment.be  This test is no t yet approved or cleared by the [place] FDA and  has been authorized for detection and/or diagnosis of SARS-CoV-2 by FDA under an Emergency Use Authorization (EUA). This EUA will remain  in effect (meaning this test can be used) for the duration of the COVID-19 declaration under Section 564(b)(1) of the Act, 21 U.S.C.section 360bbb-3(b)(1), unless the authorization is terminated  or revoked sooner.       Influenza A by PCR NEGATIVE NEGATIVE Final   Influenza B by PCR NEGATIVE NEGATIVE Final    Comment: (NOTE) The Xpert Xpress SARS-CoV-2/FLU/RSV plus assay is intended as an aid in the diagnosis of influenza from Nasopharyngeal swab specimens and should not be used as a sole basis for treatment. Nasal washings and aspirates are unacceptable for Xpert Xpress SARS-CoV-2/FLU/RSV testing.  Fact Sheet for Patients: EntrepreneurPulse.com.au  Fact Sheet for Healthcare  Providers: IncredibleEmployment.be  This test is not yet approved or cleared by the [place] FDA and has been authorized for detection and/or diagnosis of SARS-CoV-2 by FDA under an Emergency Use Authorization (EUA). This EUA will remain in effect (meaning this test can be used) for the duration of the COVID-19 declaration under Section 564(b)(1) of the Act, 21 U.S.C. section 360bbb-3(b)(1), unless the authorization is terminated or revoked.     Resp Syncytial Virus by PCR NEGATIVE NEGATIVE Final    Comment: (NOTE) Fact Sheet for Patients: EntrepreneurPulse.com.au  Fact Sheet for Healthcare Providers: IncredibleEmployment.be  This test is not yet approved or cleared by the [place] FDA and has been authorized for detection and/or diagnosis of SARS-CoV-2 by FDA under an Emergency Use Authorization (EUA). This EUA will remain in effect (meaning this test can be used) for the duration of the COVID-19 declaration under Section 564(b)(1) of the Act, 21 U.S.C. section 360bbb-3(b)(1), unless the authorization is terminated or revoked.  Performed at [facility], [address], [place], [place] [address]   MRSA Next Gen by PCR, Nasal     Status: Abnormal   Collection Time: 03/18/22  8:33 PM   Specimen: Nasal Mucosa; Nasal Swab  Result Value Ref Range Status   MRSA by PCR Next Gen DETECTED (A) NOT DETECTED Final    Comment: RESULT CALLED TO, READ BACK BY AND VERIFIED WITH: KING @ 0726 ON [id number] BY HENDERSON L (NOTE) The GeneXpert MRSA Assay (FDA approved for NASAL specimens only), is one component of a comprehensive MRSA colonization surveillance program. It is not intended to diagnose MRSA infection nor to guide or monitor treatment for MRSA infections. Test performance is not FDA approved in patients less than 54 years old. Performed at [facility], [address] [place], [place] [address]     Time spent: 31  minutes  Signed: Orson Eva, MD 03/22/2022

## 2022-04-05 DEATH — deceased
